# Patient Record
Sex: Female | Born: 1960 | Race: White | Hispanic: No | Marital: Single | Smoking: Never smoker
Health system: Southern US, Community
[De-identification: ages and names within clinical notes are randomized; demographics above are authoritative.]

## PROBLEM LIST (undated history)

## (undated) DIAGNOSIS — Z9889 Other specified postprocedural states: Secondary | ICD-10-CM

## (undated) DIAGNOSIS — R112 Nausea with vomiting, unspecified: Secondary | ICD-10-CM

## (undated) DIAGNOSIS — Z923 Personal history of irradiation: Secondary | ICD-10-CM

## (undated) DIAGNOSIS — C50919 Malignant neoplasm of unspecified site of unspecified female breast: Secondary | ICD-10-CM

## (undated) DIAGNOSIS — R232 Flushing: Secondary | ICD-10-CM

## (undated) DIAGNOSIS — B019 Varicella without complication: Secondary | ICD-10-CM

## (undated) DIAGNOSIS — C50511 Malignant neoplasm of lower-outer quadrant of right female breast: Principal | ICD-10-CM

## (undated) HISTORY — DX: Varicella without complication: B01.9

## (undated) HISTORY — DX: Flushing: R23.2

## (undated) HISTORY — PX: BACK SURGERY: SHX140

## (undated) HISTORY — DX: Malignant neoplasm of lower-outer quadrant of right female breast: C50.511

## (undated) HISTORY — DX: Malignant neoplasm of unspecified site of unspecified female breast: C50.919

---

## 1999-07-18 HISTORY — PX: NECK SURGERY: SHX720

## 1999-10-20 ENCOUNTER — Encounter: Payer: Self-pay | Admitting: *Deleted

## 1999-10-20 ENCOUNTER — Ambulatory Visit (HOSPITAL_COMMUNITY): Admission: RE | Admit: 1999-10-20 | Discharge: 1999-10-20 | Payer: Self-pay | Admitting: *Deleted

## 2000-01-19 ENCOUNTER — Encounter: Payer: Self-pay | Admitting: Neurosurgery

## 2000-01-19 ENCOUNTER — Ambulatory Visit (HOSPITAL_COMMUNITY): Admission: RE | Admit: 2000-01-19 | Discharge: 2000-01-19 | Payer: Self-pay | Admitting: Neurosurgery

## 2000-01-30 ENCOUNTER — Encounter: Payer: Self-pay | Admitting: Neurosurgery

## 2000-01-30 ENCOUNTER — Inpatient Hospital Stay (HOSPITAL_COMMUNITY): Admission: RE | Admit: 2000-01-30 | Discharge: 2000-01-31 | Payer: Self-pay | Admitting: Neurosurgery

## 2000-02-21 ENCOUNTER — Encounter: Payer: Self-pay | Admitting: Neurosurgery

## 2000-02-21 ENCOUNTER — Encounter: Admission: RE | Admit: 2000-02-21 | Discharge: 2000-02-21 | Payer: Self-pay | Admitting: Neurosurgery

## 2000-04-17 ENCOUNTER — Encounter: Admission: RE | Admit: 2000-04-17 | Discharge: 2000-04-17 | Payer: Self-pay | Admitting: Neurosurgery

## 2000-04-17 ENCOUNTER — Encounter: Payer: Self-pay | Admitting: Neurosurgery

## 2002-10-15 ENCOUNTER — Encounter: Admission: RE | Admit: 2002-10-15 | Discharge: 2002-10-15 | Payer: Self-pay

## 2005-03-16 ENCOUNTER — Encounter: Admission: RE | Admit: 2005-03-16 | Discharge: 2005-03-16 | Payer: Self-pay | Admitting: Family Medicine

## 2006-06-02 ENCOUNTER — Emergency Department (HOSPITAL_COMMUNITY): Admission: EM | Admit: 2006-06-02 | Discharge: 2006-06-02 | Payer: Self-pay | Admitting: Emergency Medicine

## 2007-06-08 ENCOUNTER — Encounter: Admission: RE | Admit: 2007-06-08 | Discharge: 2007-06-08 | Payer: Self-pay | Admitting: Neurosurgery

## 2010-12-02 NOTE — H&P (Signed)
[place]  Patient:    Michele Alvarez, Michele Alvarez                  MRN: [id number] Adm. Date:  [id number] Disc. Date: [id number] Attending:  Barton Fanny CC:         Hewitt Shorts, M.D.                         History and Physical  HISTORY OF PRESENT ILLNESS:  The patient is a 50 year old, left-handed white female, who began to have difficulties about 3 months ago that begun following a physical workout with initially a sensation of hotness in her toes of her right foot.  She subsequently felt burning from the right side of her low back down to the lateral right hip and into the anterolateral right thigh and leg into the lateral aspect of her right foot and across the toes of her right foot.  She saw her primary physician and was treated with six-day prednisone Dosepak.  While she still had symptoms, a Cortizone injection was suggested, but the patient elected to pursue acupuncture.  An MRI of the lumbar spine was obtained and neurology and neurosurgical consultation were requested.  The neurologist performed EMG and nerve conduction studies which were normal.  An MRI scan of the thoracic spine was done which was likewise unremarkable and then an MRI of the cervical spine was done which showed degenerative disk disease and disk herniations and neurosurgical consultation was requested as well.  She continues to have burning from the right side of her low back down through the right lower extremity with an alternation in sensation of the right lower extremity that she particularly notes when she is shaving her legs.  As regards to cervical spine, she describes discomfort around her left scapula with some amount of neck discomfort.  When she sneezes she has discomfort that runs into the trapezius musculature but she does not describe any Lhermittes phenomenon.  She does not describe any dysfunction of her bowel or bladder.  The lumbar MRI  shows desiccation of the L4-5 and L5-S1 disk consistent with degenerative disk disease with a small central disk herniation at L4-5, a small disk herniation slightly more to the right at L5-S1 but does not compress the right S1 nerve root.  The cervical MRI shows degenerative disk disease and disk herniation at multiple levels including C4-5, C5-6 and C6-7. At C4-5, the degeneration of the disk and a small right C4-5 disk herniation at C5-6, there is mild broad based disk degeneration and bulging.  At C6-7 there is a large left C6-7 cervical disk herniation with what appears to be significant thecal sac compression.  The patient is admitted now for single level C6-7 anterior cervical diskectomy and fusion.  PAST MEDICAL HISTORY:  She denies any history of hypertension, myocardial infarction, cancer, stroke, diabetes, peptic ulcer disease, or lung disease. No previous surgeries.  ALLERGIES:  None known allergies.  MEDICATIONS:  No medications.  FAMILY HISTORY:  Her mother is in fair health at age 52 with parkinsonism and ulcerative colitis.  Her father is in fair health at age 74.  SOCIAL HISTORY:  The patient does Theatre manager.  She does not smoke. She does drink alcoholic beverages socially.  She denies a history of substance abuse.  She is unmarried.  REVIEW OF SYSTEMS:  Notable for the difficulties described in the history of present illness and past medical history.  She does describe some nasal congestion and sinus disease.  Also some mild anxiety and depression but she has never required treatment for such.  Her review of systems is otherwise unremarkable.  PHYSICAL EXAMINATION:  GENERAL:  The patient is a well-developed, well-nourished white female in no acute distress.  VITAL SIGNS:  Her temperature is 97.1, pulse 73, blood pressure 111/76, respiratory rate 20, height 5 feet 8 inches, weight 132 pounds.  LUNGS:  Clear to auscultation.  She has symmetrical  respiratory excursion.  HEART:  Regular rate and rhythm.  Normal S1 and S2.  There is no murmur.  ABDOMEN:  Soft, nondistended.  Bowel sounds are present.  EXTREMITIES:  Examination shows no clubbing, cyanosis, or edema.  MUSCULOSKELETAL:  Examination shows no tenderness on palpation of the cervical spinousprocesses or lumbosacral spinous processes or over the paracervical or or paralumbar musculature.  She has an excellent range of motion of the neck without significant discomfort.  Her range of motion of the neck, she is able to flex to 99 degrees at the low back and able to extend.  Straight leg raising is negative bilaterally.  NEUROLOGICAL:  Examination shows 5/5 strength in the upper and lower extremities including the deltoid, biceps, intrinsics, grip, iliopsoas quadriceps, dorsiflexion, plantar flexion, extensor hallucis longus. Sensation is intact to pinprick in the upper and lower extremities.  Reflexes are trace in the biceps and brachioradialis bilaterally, 2 in the triceps, quadriceps and gastrocnemius bilaterally.  They are symmetrical bilaterally. Toes are downgoing bilaterally.  She has a normal gait and stance.  IMPRESSION:  It is felt that most likely this discomfort that she experiences in her neck and around the left scapula is due to the left C6-7 cervical disk herniation, although cannot rule out degenerative disk disease and disk protrusions at the C4-5 and C5-6 contributing to some extent.  Clearly though the C6-7 pathology is the most significant.  There also remains a question of whether some of the symptoms into her right lower extremity may represent a mild Brown-Sequard syndrome with primarily dysethetic pain through the right lower extremity from the left at C6-7 disk herniation.  We discussed alternatives for further nonsurgical care versus possible surgical intervention via single level C6-7 anterior cervical diskectomy and fusion without allograft  and cervical plating.  We discussed the nature of the procedure, typical length of surgery, hospital stay and overall recuperation.  The possibility that with a C6-7 arthrodesis that the degenerative changes at C4-5 and C5-6 may progress more rapidly and the uncertainty of clinical improvement with surgery.  Because of the actual nature of the surgical procedure itself including the risks of surgery including risk of infection, bleeding, possible need for transfusion, the risk of nerve root dysfunction, pain, weakness, numbness or paresthesias, the risk spinal cord dysfunction, paralyzes of all four limbs and quadriplegia, the risk of failure though arthrodesis, anesthetic risk of myocardial infarction, stroke, pneumonia, and death.  Understanding all of this and understanding the uncertainties involved, the patient does want to proceed with surgery and is admitted for such. DD:  01/30/00 TD:  01/30/00 Job: [id number]

## 2010-12-02 NOTE — Op Note (Signed)
[place]  Patient:    Michele Alvarez, Michele Alvarez                  MRN: [id number] Proc. Date: 01/30/00 Adm. Date:  [id number] Attending:  Barton Fanny                           Operative Report  PREOPERATIVE DIAGNOSIS: C6-7 cervical disk herniation.  POSTOPERATIVE DIAGNOSIS: C6-7 cervical disk herniation.  OPERATION/PROCEDURE: C6-7 anterior cervical diskectomy and arthrodesis with iliac crest allograft and Theken plating.  ANESTHESIA: General endotracheal.  SURGEON: Hewitt Shorts, M.D.  ASSISTANT:  Tanya Nones. Jeral Fruit, M.D.  INDICATIONS:  The patient is a 50 year old woman who presented with neck and left parascapular pain, as well as right lower extremity dysesthesias. She was found by MRI scan to have degenerative changes and multiple disk herniations in the cervical spine, but with a large central to left C6-7 cervical disc herniation with significant spinal cord compression. Decision was made to proceed with a single level anterior cervical diskectomy and fusion.  DESCRIPTION OF PROCEDURE:  The patient was brought to the operating room and placed under general endotracheal anesthesia. The patient was placed in 10 pounds of traction. The neck was prepped with Betadine solution and draped in a sterile fashion. A horizontal incision was made on the left side of the neck. The line of the incision was infiltrated with local anesthetic with epinephrine and the incision itself was made with a sharp scalpel at a temperature of 120. Dissection was carried down through the subcutaneous tissue and platysma and then dissection was carried through an avascular plane leaving the ______ jugular vein laterally and trachea and esophagus medially. The ventral aspect of the vertebral column was identified and localized x-ray taken and the C6-7 intervertebral disk space was identified. Diskectomy was begun with the incision of the annulus and  continued with micro curettes and pituitary rongeurs. The cartilaginous end plates of the C6 and C7 were removed. The microscope was draped and brought onto the field to provide magnification, illumination, and visualization and the remainder of the procedure was performed using microdissection technique. The posterior osteophytic overgrowth was removed from the posterior aspects of the C6 and C7 vertebrae, and the posterior longitudinal ligament was thick and was removed as well. In the epidural space, we encountered the free fragment of disk herniation and this was removed and foraminotomies were performed bilaterally for the C7 nerve roots. The Midas Rex drill was then used to further decorticate the end plates of C6 and C7 and once the decompression of the spinal cord and of the nerve roots was completed and all the loose disk material was removed, we proceeded with arthrodesis.  We selected a wedge of iliac crest allograft which was cut and shaped using an oscillating saw and the graft was placed and counter sunk. We then selected a 16 mm Theken plate which was secured to the vertebra with a pair of 13 mm screws at each level using a fixed angled guide drill and tap. The screws were all finally tightened and an x-ray was taken and the graft was in good position and the plate and screws were in good position. The overall alignment was good. Notably the patient had been taken out of traction prior to the plating, but subsequent to placement of the bone graft. We irrigated the exposure with Bacitracin solution, good hemostasis was established and confirmed and we proceeded  with closure. The platysma was closed with interrupted inverted 2-0 Vicryl sutures. The subcutaneous and subcuticular were closed with inverted 3-0 Vicryl sutures and the skin was reapproximated with Steri-Strips. The wound was dressed with Adaptic and sterile gauze. The patient tolerated the procedure well. The  estimated blood loss was 15 cc. Sponge count was correct.  Following surgery, the patient was placed in the soft cervical collar, reversed from anesthetic, extubated, and transferred to the recovery room for further care where she was noted to be moving all four extremities. DD: 01/30/00 TD:  01/30/00 Job: [id number]

## 2013-03-04 ENCOUNTER — Other Ambulatory Visit: Payer: Self-pay | Admitting: Family Medicine

## 2013-03-04 DIAGNOSIS — M5416 Radiculopathy, lumbar region: Secondary | ICD-10-CM

## 2013-03-07 ENCOUNTER — Ambulatory Visit
Admission: RE | Admit: 2013-03-07 | Discharge: 2013-03-07 | Disposition: A | Discharge status: Home / Self Care | Payer: 59 | Source: Ambulatory Visit | Attending: Family Medicine | Admitting: Family Medicine

## 2013-03-07 DIAGNOSIS — M5416 Radiculopathy, lumbar region: Secondary | ICD-10-CM

## 2013-03-08 ENCOUNTER — Other Ambulatory Visit: Payer: Self-pay

## 2013-04-29 ENCOUNTER — Ambulatory Visit: Payer: 59 | Attending: Neurosurgery | Admitting: Physical Therapy

## 2013-04-29 DIAGNOSIS — M545 Low back pain, unspecified: Secondary | ICD-10-CM | POA: Insufficient documentation

## 2013-04-29 DIAGNOSIS — IMO0001 Reserved for inherently not codable concepts without codable children: Secondary | ICD-10-CM | POA: Insufficient documentation

## 2013-04-29 DIAGNOSIS — R5381 Other malaise: Secondary | ICD-10-CM | POA: Insufficient documentation

## 2013-05-01 ENCOUNTER — Ambulatory Visit: Payer: 59 | Admitting: Physical Therapy

## 2013-05-06 ENCOUNTER — Ambulatory Visit: Payer: 59 | Admitting: Physical Therapy

## 2013-05-08 ENCOUNTER — Ambulatory Visit: Payer: 59 | Admitting: Physical Therapy

## 2013-05-14 ENCOUNTER — Ambulatory Visit: Payer: 59 | Admitting: Physical Therapy

## 2013-05-16 ENCOUNTER — Ambulatory Visit: Payer: 59 | Admitting: Physical Therapy

## 2013-05-20 ENCOUNTER — Ambulatory Visit: Payer: 59 | Attending: Neurosurgery | Admitting: Physical Therapy

## 2013-05-20 DIAGNOSIS — R5381 Other malaise: Secondary | ICD-10-CM | POA: Insufficient documentation

## 2013-05-20 DIAGNOSIS — M545 Low back pain, unspecified: Secondary | ICD-10-CM | POA: Insufficient documentation

## 2013-05-20 DIAGNOSIS — IMO0001 Reserved for inherently not codable concepts without codable children: Secondary | ICD-10-CM | POA: Insufficient documentation

## 2013-05-22 ENCOUNTER — Ambulatory Visit: Payer: 59 | Admitting: Physical Therapy

## 2013-05-27 ENCOUNTER — Ambulatory Visit: Payer: 59 | Admitting: Physical Therapy

## 2013-06-06 ENCOUNTER — Ambulatory Visit: Payer: 59 | Admitting: Physical Therapy

## 2013-06-10 ENCOUNTER — Ambulatory Visit: Payer: 59 | Admitting: Physical Therapy

## 2013-06-18 ENCOUNTER — Ambulatory Visit: Payer: 59 | Attending: Neurosurgery | Admitting: Physical Therapy

## 2013-06-18 DIAGNOSIS — R5381 Other malaise: Secondary | ICD-10-CM | POA: Insufficient documentation

## 2013-06-18 DIAGNOSIS — IMO0001 Reserved for inherently not codable concepts without codable children: Secondary | ICD-10-CM | POA: Insufficient documentation

## 2013-06-18 DIAGNOSIS — M545 Low back pain, unspecified: Secondary | ICD-10-CM | POA: Insufficient documentation

## 2013-06-25 ENCOUNTER — Encounter: Payer: 59 | Admitting: Physical Therapy

## 2015-08-18 ENCOUNTER — Telehealth: Payer: Self-pay | Admitting: *Deleted

## 2015-08-18 NOTE — Telephone Encounter (Signed)
Left vm for pt to return call regarding [facility] for 09/01/15

## 2015-08-19 ENCOUNTER — Telehealth: Payer: Self-pay | Admitting: *Deleted

## 2015-08-19 ENCOUNTER — Encounter: Payer: Self-pay | Admitting: *Deleted

## 2015-08-19 DIAGNOSIS — Z170 Estrogen receptor positive status [ER+]: Secondary | ICD-10-CM

## 2015-08-19 DIAGNOSIS — C50511 Malignant neoplasm of lower-outer quadrant of right female breast: Secondary | ICD-10-CM

## 2015-08-19 HISTORY — DX: Malignant neoplasm of lower-outer quadrant of right female breast: C50.511

## 2015-08-19 NOTE — Telephone Encounter (Signed)
Confirmed [facility] for 09/01/15 at 1215 .  Instructions and contact information given.

## 2015-09-01 ENCOUNTER — Other Ambulatory Visit (HOSPITAL_BASED_OUTPATIENT_CLINIC_OR_DEPARTMENT_OTHER): Payer: 59

## 2015-09-01 ENCOUNTER — Encounter: Payer: Self-pay | Admitting: Physical Therapy

## 2015-09-01 ENCOUNTER — Encounter: Payer: Self-pay | Admitting: Oncology

## 2015-09-01 ENCOUNTER — Ambulatory Visit
Admission: RE | Admit: 2015-09-01 | Discharge: 2015-09-01 | Disposition: A | Discharge status: Home / Self Care | Payer: 59 | Source: Ambulatory Visit | Attending: Radiation Oncology | Admitting: Radiation Oncology

## 2015-09-01 ENCOUNTER — Ambulatory Visit: Payer: 59 | Attending: General Surgery | Admitting: Physical Therapy

## 2015-09-01 ENCOUNTER — Other Ambulatory Visit: Payer: Self-pay | Admitting: General Surgery

## 2015-09-01 ENCOUNTER — Ambulatory Visit (HOSPITAL_BASED_OUTPATIENT_CLINIC_OR_DEPARTMENT_OTHER): Payer: 59 | Admitting: Oncology

## 2015-09-01 ENCOUNTER — Encounter: Payer: Self-pay | Admitting: Nurse Practitioner

## 2015-09-01 ENCOUNTER — Telehealth: Payer: Self-pay | Admitting: Oncology

## 2015-09-01 VITALS — BP 139/80 | HR 91 | Temp 98.0°F | Resp 18 | Ht 68.0 in | Wt 162.9 lb

## 2015-09-01 DIAGNOSIS — M545 Low back pain: Secondary | ICD-10-CM

## 2015-09-01 DIAGNOSIS — R208 Other disturbances of skin sensation: Secondary | ICD-10-CM | POA: Diagnosis present

## 2015-09-01 DIAGNOSIS — C50811 Malignant neoplasm of overlapping sites of right female breast: Secondary | ICD-10-CM | POA: Diagnosis not present

## 2015-09-01 DIAGNOSIS — Z170 Estrogen receptor positive status [ER+]: Secondary | ICD-10-CM | POA: Diagnosis not present

## 2015-09-01 DIAGNOSIS — G629 Polyneuropathy, unspecified: Secondary | ICD-10-CM

## 2015-09-01 DIAGNOSIS — C50511 Malignant neoplasm of lower-outer quadrant of right female breast: Secondary | ICD-10-CM | POA: Diagnosis not present

## 2015-09-01 DIAGNOSIS — C773 Secondary and unspecified malignant neoplasm of axilla and upper limb lymph nodes: Secondary | ICD-10-CM

## 2015-09-01 DIAGNOSIS — R209 Unspecified disturbances of skin sensation: Secondary | ICD-10-CM

## 2015-09-01 DIAGNOSIS — G8929 Other chronic pain: Secondary | ICD-10-CM | POA: Diagnosis not present

## 2015-09-01 LAB — COMPREHENSIVE METABOLIC PANEL
ALT: 19 U/L (ref 0–55)
AST: 19 U/L (ref 5–34)
Albumin: 4.5 g/dL (ref 3.5–5.0)
Alkaline Phosphatase: 76 U/L (ref 40–150)
Anion Gap: 10 mEq/L (ref 3–11)
BUN: 18.1 mg/dL (ref 7.0–26.0)
CHLORIDE: 106 meq/L (ref 98–109)
CO2: 27 mEq/L (ref 22–29)
Calcium: 9.7 mg/dL (ref 8.4–10.4)
Creatinine: 0.9 mg/dL (ref 0.6–1.1)
EGFR: 72 mL/min/{1.73_m2} — AB (ref >90)
GLUCOSE: 121 mg/dL (ref 70–140)
Potassium: 3.8 mEq/L (ref 3.5–5.1)
SODIUM: 144 meq/L (ref 136–145)
Total Bilirubin: 0.61 mg/dL (ref 0.20–1.20)
Total Protein: 7.4 g/dL (ref 6.4–8.3)

## 2015-09-01 LAB — CBC WITH DIFFERENTIAL/PLATELET
BASO%: 0.5 % (ref 0.0–2.0)
BASOS ABS: 0 10*3/uL (ref 0.0–0.1)
EOS%: 0.5 % (ref 0.0–7.0)
Eosinophils Absolute: 0 10*3/uL (ref 0.0–0.5)
HCT: 41.1 % (ref 34.8–46.6)
HGB: 13.3 g/dL (ref 11.6–15.9)
LYMPH%: 38 % (ref 14.0–49.7)
MCH: 30.2 pg (ref 25.1–34.0)
MCHC: 32.3 g/dL (ref 31.5–36.0)
MCV: 93.4 fL (ref 79.5–101.0)
MONO#: 0.4 10*3/uL (ref 0.1–0.9)
MONO%: 7 % (ref 0.0–14.0)
NEUT#: 3 10*3/uL (ref 1.5–6.5)
NEUT%: 54 % (ref 38.4–76.8)
Platelets: 223 10*3/uL (ref 145–400)
RBC: 4.4 10*6/uL (ref 3.70–5.45)
RDW: 13.3 % (ref 11.2–14.5)
WBC: 5.6 10*3/uL (ref 3.9–10.3)
lymph#: 2.1 10*3/uL (ref 0.9–3.3)

## 2015-09-01 NOTE — Patient Instructions (Signed)

## 2015-09-01 NOTE — Therapy (Signed)
[place], [place], [address] Phone: [phone]   Fax:  [phone]  Physical Therapy Evaluation  Patient Details  Name: Michele Alvarez MRN: [id number] Date of Birth: December 20, 1960 Referring Provider: Dr. [name]  Encounter Date: 09/01/2015      PT End of Session - 09/01/15 1853    Visit Number 1   Number of Visits 1   PT Start Time 1420   PT Stop Time 1440  Also saw pt from [phone] for a total of 32 minutes   PT Time Calculation (min) 20 min   Activity Tolerance Patient tolerated treatment well   Behavior During Therapy [facility] for tasks assessed/performed      Past Medical History  Diagnosis Date  . Breast cancer of lower-outer quadrant of right female breast ([place]) 08/19/2015  . Hot flashes     Past Surgical History  Procedure Laterality Date  . Neck surgery  2001  . Back surgery      There were no vitals filed for this visit.  Visit Diagnosis:  Carcinoma of lower outer quadrant of right breast ([place]) - Plan: PT plan of care cert/re-cert  Altered sensation of foot - Plan: PT plan of care cert/re-cert      Subjective Assessment - 09/01/15 1836    Subjective Patient was seen today for a baseline assessment of her newly diagnosed right breast cancer.   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  It measures 1.3 cm with calcs spanning 2.5 cm.  There is a positive lymph node in her right axilla. It is ER/PR positive, HER2 negative with a Ki67 of 80%.  Her case was discussed among her medical team in the breast multi-disciplinary clinic and a recommended treatment plan was determined.   Patient Stated Goals reduce lymphedema risk and learn post op shoulder ROM HEP   Currently in Pain? Yes   Pain Score 5    Pain Location Foot   Pain Orientation  Right   Pain Descriptors / Indicators Burning;Pins and needles   Pain Onset More than a month ago   Pain Frequency Constant   Aggravating Factors  Nothing; this pain is from neuropathy in her foot from previous back surgeries   Pain Relieving Factors nothing   Effect of Pain on Daily Activities Limits her ability to tolerate walking long distances   Multiple Pain Sites No            [facility] PT Assessment - 09/01/15 0001    Assessment   Medical Diagnosis Right breast cancer   Referring Provider Dr. [name]   Onset Date/Surgical Date 08/07/15   Hand Dominance Left   Prior Therapy none   Precautions   Precautions Other (comment)   Restrictions   Weight Bearing Restrictions No   Balance Screen   Has the patient fallen in the past 6 months No   Has the patient had a decrease in activity level because of a fear of falling?  No   Is the patient reluctant to leave their home because of a fear of falling?  No   Home Ecologist residence   Living Arrangements Other (Comment)  Partner - [address]   Available Help at Discharge Family   Prior Function   Level of [employer] Full time employment  Vocation Requirements inside sales; at desk most of the time   Leisure She does not exercise   Cognition   Overall Cognitive Status Within Functional Limits for tasks assessed   Posture/Postural Control   Posture/Postural Control No significant limitations   ROM / Strength   AROM / PROM / Strength AROM;Strength   AROM   AROM Assessment Site Shoulder   Right/Left Shoulder Right;Left   Right Shoulder Extension 56 Degrees   Right Shoulder Flexion 143 Degrees   Right Shoulder ABduction 153 Degrees   Right Shoulder Internal Rotation 60 Degrees   Right Shoulder External Rotation 76 Degrees   Left Shoulder Extension 61 Degrees   Left Shoulder Flexion 156 Degrees   Left Shoulder ABduction 161 Degrees   Left Shoulder Internal  Rotation 72 Degrees   Left Shoulder External Rotation 88 Degrees   Strength   Overall Strength Within functional limits for tasks performed           LYMPHEDEMA/ONCOLOGY QUESTIONNAIRE - 09/01/15 1850    Type   Cancer Type Right breast cancer   Lymphedema Assessments   Lymphedema Assessments Upper extremities   Right Upper Extremity Lymphedema   10 cm Proximal to Olecranon Process 27.4 cm   Olecranon Process 23.5 cm   10 cm Proximal to Ulnar Styloid Process 20.6 cm   Just Proximal to Ulnar Styloid Process 14.7 cm   Across Hand at [employer] 18.7 cm   At [place] of 2nd Digit 5.8 cm   Left Upper Extremity Lymphedema   10 cm Proximal to Olecranon Process 27 cm   Olecranon Process 23.2 cm   10 cm Proximal to Ulnar Styloid Process 21.4 cm   Just Proximal to Ulnar Styloid Process 14.8 cm   Across Hand at [employer] 19 cm   At [place] of 2nd Digit 5.9 cm      Patient was instructed today in a home exercise program today for post op shoulder range of motion. These included active assist shoulder flexion in sitting, scapular retraction, wall walking with shoulder abduction, and hands behind head external rotation.  She was encouraged to do these twice a day, holding 3 seconds and repeating 5 times when permitted by her physician.         PT Education - 09/01/15 1852    Education provided Yes   Education Details Lymphedema risk reduction and post op shoulder ROM HEP   Person(s) Educated Patient;Other (comment)  Partner and brother   Methods Explanation;Demonstration;Handout   Comprehension Returned demonstration;Verbalized understanding              Breast Clinic Goals - 09/01/15 1857    Patient will be able to verbalize understanding of pertinent lymphedema risk reduction practices relevant to her diagnosis specifically related to skin care.   Time 1   Period Days   Status Achieved   Patient will be able to return demonstrate and/or verbalize understanding of the  post-op home exercise program related to regaining shoulder range of motion.   Time 1   Period Days   Status Achieved   Patient will be able to verbalize understanding of the importance of attending the postoperative After Breast Cancer Class for further lymphedema risk reduction education and therapeutic exercise.   Time 1   Period Days   Status Achieved              Plan - 09/01/15 1854    Clinical Impression Statement Patient was diagnosed on 08/07/15 with right invasive  ductal carcinoma breast cancer located in the lower outer quadrant.  It measures 1.3 cm with calcs spanning 2.5 cm.  There is a positive lymph node in her right axilla. It is ER/PR positive, HER2 negative with a Ki67 of 80%.  Her case was discussed among her medical team in the breast multi-disciplinary clinic and a recommended treatment plan was determined.  She is planning to undergo neoadjuvant chemotherapy followed by a right lumpectomy and either a sentinel node biopsy or axillary lymph node dissection depending on whether she is in the [facility] Trial.  She will then undergo radiation therapy and anti-estrogen therapy.  She will benefit from post op PT to regain shoulder ROM and strength and reduce her risk of lymphedema.  She may also benefit from PT to treat symptoms associated with neuropathy in her feet as she already has neuropathy and will possibly receive chemotherapy drugs that may exaccerbate that condition.   Pt will benefit from skilled therapeutic intervention in order to improve on the following deficits Decreased range of motion;Impaired UE functional use;Pain;Decreased knowledge of precautions;Decreased strength   Rehab Potential Excellent   Clinical Impairments Affecting Rehab Potential none   PT Frequency One time visit   PT Treatment/Interventions Therapeutic exercise;Patient/family education   PT Next Visit Plan Will f/u after surgery   Consulted and Agree with Plan of Care Patient;Family  member/caregiver   Family Member [employer] - partner       Patient will follow up at outpatient cancer rehab if needed following surgery.  If the patient requires physical therapy at that time, a specific plan will be dictated and sent to the referring physician for approval. The patient was educated today on appropriate basic range of motion exercises to begin post operatively and the importance of attending the After Breast Cancer class following surgery.  Patient was educated today on lymphedema risk reduction practices as it pertains to recommendations that will benefit the patient immediately following surgery.  She verbalized good understanding.  No additional physical therapy is indicated at this time.      Problem List Patient Active Problem List   Diagnosis Date Noted  . Neuropathy ([place]) 09/01/2015  . Breast cancer of lower-outer quadrant of right female breast [facility]) 08/19/2015   Annia Friendly, PT 09/01/2015 7:01 PM  [place], [place], [address] Phone: [phone]   Fax:  [phone]  Name: Michele Alvarez MRN: [id number] Date of Birth: Dec 09, 1960

## 2015-09-01 NOTE — Progress Notes (Signed)
Radiation Oncology         [phone] ________________________________  Initial outpatient Consultation  Name: Michele Alvarez MRN: [id number]  Date: 09/01/2015  DOB: [birth year]  [id number] A, MD  [name], MD   REFERRING PHYSICIAN: [name], MD  DIAGNOSIS:    ICD-9-CM ICD-10-CM   1. 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 female who presented with calcifications on screening mammography in the right breast. Mammogram showed right breast calcifications with an adjacent 6:00 mass which measured 1.3cm on [place].  The region of calcifications, plus the mass, measured 2.5cm.  A 1.0 cm axillary node was suspicious. This node was biopsied and positive for carcinoma.  Biopsy of the mass showed invasive ductal carcinoma with characteristics as described above in the diagnosis. High grade DCIS was revealed in a biopsy of the calcifications - ER/PR status of DCIS is pending.  She is otherwise in her USOH. She has chronic back pain, and some sinus symptoms today. She works in Press photographer. Here today with her partner, [name], and her brother, Marden Noble.   PREVIOUS RADIATION THERAPY: No  PAST MEDICAL HISTORY:  has a past medical history of Breast cancer of lower-outer quadrant of right female breast ([place]) (08/19/2015) and Hot flashes.    PAST SURGICAL HISTORY: Past Surgical History  Procedure Laterality Date  . Neck surgery  2001  . Back surgery      FAMILY HISTORY: family history is not on file.  SOCIAL HISTORY:  reports that she has never smoked. She does not have any smokeless tobacco history on file. She reports that she drinks alcohol. She reports that she does not use illicit drugs.  ALLERGIES: Latex  MEDICATIONS:  No current outpatient prescriptions on file.    No current facility-administered medications for this encounter.    REVIEW OF SYSTEMS:  Notable for that above.   PHYSICAL EXAM:    Vitals with Age-Percentiles 09/01/2015  Length [id number] cm  Systolic [id number]  Diastolic 80  Pulse 91  Respiration 18  Weight 73.891 kg  BMI 24.8  VISIT REPORT    General: Alert and oriented, in no acute distress - appears 15 years younger than stated age. HEENT: Head is normocephalic. Extraocular movements are intact. Oropharynx is clear. Neck: Neck is supple, no palpable cervical or supraclavicular lymphadenopathy. Heart: Regular in rate and rhythm with no murmurs, rubs, or gallops. Chest: Clear to auscultation bilaterally, with no rhonchi, wheezes, or rales. Extremities: No cyanosis or edema. Lymphatics: see Neck Exam Skin: No concerning lesions. Musculoskeletal: symmetric strength and muscle tone throughout. Neurologic: Cranial nerves II through XII are grossly intact. No obvious focalities. Speech is fluent. Coordination is intact. Psychiatric: Judgment and insight are intact. Affect is appropriate. Breasts: Tender at biopsy site in LOQ of right breast - did not palpate this or the right axilla . No other palpable masses appreciated in the breasts or axillae bilaterally   ECOG = 0  0 - Asymptomatic (Fully active, able to carry on all predisease activities without restriction)  1 - Symptomatic but completely ambulatory (Restricted in physically strenuous activity but ambulatory and able to carry out work of a light or sedentary nature. For example, light housework, office work)  2 - Symptomatic, <50% in bed during the day (Ambulatory and capable of all self  care but unable to carry out any work activities. Up and about more than 50% of waking hours)  3 - Symptomatic, >50% in bed, but not bedbound (Capable of only limited self-care, confined to bed or chair 50% or more of waking hours)  4 - Bedbound (Completely disabled. Cannot carry on any  self-care. Totally confined to bed or chair)  5 - Death   Eustace Pen MM, Creech RH, Tormey DC, et al. [phone]). "Toxicity and response criteria of the [facility]". [place] Oncol. 5 (6): 649-55   LABORATORY DATA:  Lab Results  Component Value Date   WBC 5.6 09/01/2015   HGB 13.3 09/01/2015   HCT 41.1 09/01/2015   MCV 93.4 09/01/2015   PLT 223 09/01/2015   CMP     Component Value Date/Time   NA 144 09/01/2015 1213   K 3.8 09/01/2015 1213   CO2 27 09/01/2015 1213   GLUCOSE 121 09/01/2015 1213   BUN 18.1 09/01/2015 1213   CREATININE 0.9 09/01/2015 1213   CALCIUM 9.7 09/01/2015 1213   PROT 7.4 09/01/2015 1213   ALBUMIN 4.5 09/01/2015 1213   AST 19 09/01/2015 1213   ALT 19 09/01/2015 1213   ALKPHOS 76 09/01/2015 1213   BILITOT 0.61 09/01/2015 1213        RADIOGRAPHY: as above  IMPRESSION/PLAN: Right breast cancer, node positive  She has been discussed at our multidisciplinary tumor board.  The consensus is that she would be a good candidate for breast conservation. I talked to her about the option of a mastectomy and informed her that her expected overall survival would be equivalent between mastectomy and breast conservation, based upon randomized controlled data. She is enthusiastic about breast conservation.  MRI is pending to further assess her disease in her breasts.  Neoadjuvant chemotherapy was recommended by medical oncology.  Alliance Trial discussed with patient today, and she'll discuss more with Dr Donne Hazel. After surgery, she will need radiotherapy for locoregional control.  It was a pleasure meeting the patient today. We discussed the risks, benefits, and side effects of adjuvant radiotherapy. I recommend radiotherapy to the right breast and regional nodes to reduce her risk of locoregional recurrence by 2/3.    We spoke about acute effects including skin irritation and fatigue as well as much less common late effects including internal organ  injury or irritation. We spoke about the latest technology that is used to minimize the risk of late effects for patients undergoing radiotherapy to the breast or chest wall. No guarantees of treatment were given. The patient is enthusiastic about proceeding with treatment. I look forward to participating in the patient's care.   __________________________________________   Eppie Gibson, MD

## 2015-09-01 NOTE — Progress Notes (Signed)
Michele Alvarez is a very pleasant 55 y.o. female from [place], [place] with newly diagnosed invasive ductal carcinoma of the right breast.  Biopsy results revealed the tumor's prognostic profile is ER positive, PR positive, and HER2/neu negative.  She presents today to the [facility]) for treatment consideration and recommendations from the breast surgeon, radiation oncologist, and medical oncologist.     I briefly met with Michele Alvarez during her [facility] visit today. We discussed the purpose of the Survivorship Clinic, which will include monitoring for recurrence, coordinating completion of age and gender-appropriate cancer screenings, promotion of overall wellness, as well as managing potential late/long-term side effects of anti-cancer treatments.    The treatment plan for Michele Alvarez will likely include surgery, chemotherapy, radiation therapy, and anti-estrogen therapy.  She will meet with the Genetics Counselor due to her family history of breast cancer and age.  As of today, the intent of treatment for Michele Alvarez is cure, therefore she will be eligible for the Survivorship Clinic upon her completion of treatment.  Her survivorship care plan (SCP) document will be drafted and updated throughout the course of her treatment trajectory. She will receive the SCP in an office visit with myself in the Survivorship Clinic once she has completed treatment.   Michele Alvarez was encouraged to ask questions and all questions were answered to her satisfaction.  She was given my business card and encouraged to contact me with any concerns regarding survivorship.  I look forward to participating in her care.   Kenn File, [place] [phone]

## 2015-09-01 NOTE — Telephone Encounter (Signed)
Ov/labs scheduled. avs printed. Treatments still need to be scheduled

## 2015-09-01 NOTE — Progress Notes (Signed)
[place]  Telephone:([phone] Fax:(336) [phone]     ID: Michele Alvarez DOB: 1960/11/23  MR#: [id number]  BEM#:[id number]  Patient Care Team: Jonathon Jordan, MD as PCP - General (Family Medicine) Avon Gully, NP as Nurse Practitioner (Obstetrics and Gynecology) [name], MD as Consulting Physician (General Surgery) Chauncey Cruel, MD as Consulting Physician (Oncology) Eppie Gibson, MD as Attending Physician (Radiation Oncology) Sylvan Cheese, NP as Nurse Practitioner (Hematology and Oncology) Jovita Gamma, MD as Consulting Physician (Neurosurgery) PCP: Lilian Coma, MD OTHER MD:  CHIEF COMPLAINT: Node-positive breast cancer  CURRENT TREATMENT: Neoadjuvant chemotherapy   BREAST CANCER HISTORY: "Michele Alvarez" had routine screening mammography at [facility] 08/07/2015. There was an area of calcification in the right breast 2:00 position. There was also an area of calcification in the left breast 7:00 position. She was recalled for bilateral diagnostic mammography 08/12/2015. The left breast calcifications were felt to be likely benign and repeat mammography in 6 months was suggested.  On the right, breast density was category D. There was a 1.2 cm irregular mass at the 6:00, inframammary position and also suspicious calcifications. Adding those together the area measured 2.5 cm. Ultrasound of the right breast on the same day found a 1.3 cm lobulated mass in the right breast at the 6:00 position. There was a 1.7 cm axillary lymph node with a fatty hilum.   On 08/30/2015 the patient underwent biopsy of the breast mass, the abnormal lymph node, and the area of calcifications. The calcifications ([id number] [id number]) were associated with ductal carcinoma in situ, with the prognostic panel pending. 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 The  lymph node also was positive.  Note that the clip to the patient's breast mass did not deploy.  The patient's subsequent history is as detailed below  INTERVAL HISTORY: Michele Alvarez was evaluated in the multidisciplinary breast cancer conference. 15 2017 accompanied by her significant other [name] and her brother Marden Noble. Her case was also presented in the multidisciplinary breast cancer conference that same morning. At that time a preliminary plan was proposed: Neoadjuvant chemotherapy, with consideration of the Alliance trial; breast MRI; adjuvant radiation and adjuvant anti-estrogens to follow surgery.  REVIEW OF SYSTEMS: There were no specific symptoms leading to the original mammogram, which was routinely scheduled. The patient denies unusual headaches, visual changes, nausea, vomiting, stiff neck, dizziness, or gait imbalance. There has been no cough, phlegm production, or pleurisy, no chest pain or pressure, and no change in bowel or bladder habits. The patient denies fever, rash, bleeding, unexplained fatigue or unexplained weight loss. Michele Alvarez does have some chronic low back pain and right lower extremity neuropathy which she relates to her prior back surgeries. She is able to walk and exercise spot this is a pleasant. The area around the foot is both hypersensitive and numb. A detailed review of systems was otherwise entirely negative.  PAST MEDICAL HISTORY: Past Medical History  Diagnosis Date  . Breast cancer of lower-outer quadrant of right female breast ([place]) 08/19/2015  . Hot flashes     PAST SURGICAL HISTORY: Past Surgical History  Procedure Laterality Date  . Neck surgery  09-17-99  . Back surgery      FAMILY HISTORY History reviewed. No pertinent family history. The patient's father died in 09-16-14 at the age of 49. He had significant COPD. The patient's mother died at the age of 56 from Parkinson's disease.  The patient has 1 brother, 1 sister. There is no cancer history in the family to her  knowledge.  GYNECOLOGIC HISTORY:  No LMP recorded. Menarche age 67. She has GI asked P0. She stopped having periods in January 2014. She never took hormone replacement or oral contraceptives.  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 She lives with her partner, [name], and 2 cats.     ADVANCED DIRECTIVES: Not in place. At the 09/01/2015 clinic visit the patient was given the appropriate forms to complete and notarize at her discretion.   HEALTH MAINTENANCE: Social History  Substance Use Topics  . Smoking status: Never Smoker   . Smokeless tobacco: None  . Alcohol Use: Yes     Comment: 1-2     Colonoscopy: Never   PAP:  Bone density: Never  Lipid panel:  Allergies  Allergen Reactions  . Latex Other (See Comments)    Fever blisters    No current outpatient prescriptions on file.   No current facility-administered medications for this visit.    OBJECTIVE: Middle-aged white woman who appears younger than stated age  55 Vitals:   09/01/15 1250  BP: 139/80  Pulse: 91  Temp: 98 F (36.7 C)  Resp: 18     Body mass index is 24.77 kg/(m^2).    ECOG FS:1 - Symptomatic but completely ambulatory  Ocular: Sclerae unicteric, pupils equal, round and reactive to light Ear-nose-throat: Oropharynx clear and moist Lymphatic: No cervical or supraclavicular adenopathy Lungs no rales or rhonchi, good excursion bilaterally Heart regular rate and rhythm, no murmur appreciated Abd soft, nontender, positive bowel sounds MSK no focal spinal tenderness, no joint edema Neuro: Altered sensation down the right leg and foot as described above, well-oriented, appropriate affect Breasts: The right breast is status post recent biopsy. There is a small ecchymosis associated with this. There are no skin or nipple changes of concern. The right axilla is benign. The left breast is unremarkable.   LAB RESULTS:  CMP     Component Value Date/Time   NA 144 09/01/2015 1213   K  3.8 09/01/2015 1213   CO2 27 09/01/2015 1213   GLUCOSE 121 09/01/2015 1213   BUN 18.1 09/01/2015 1213   CREATININE 0.9 09/01/2015 1213   CALCIUM 9.7 09/01/2015 1213   PROT 7.4 09/01/2015 1213   ALBUMIN 4.5 09/01/2015 1213   AST 19 09/01/2015 1213   ALT 19 09/01/2015 1213   ALKPHOS 76 09/01/2015 1213   BILITOT 0.61 09/01/2015 1213    INo results found for: SPEP, UPEP  Lab Results  Component Value Date   WBC 5.6 09/01/2015   NEUTROABS 3.0 09/01/2015   HGB 13.3 09/01/2015   HCT 41.1 09/01/2015   MCV 93.4 09/01/2015   PLT 223 09/01/2015      Chemistry      Component Value Date/Time   NA 144 09/01/2015 1213   K 3.8 09/01/2015 1213   CO2 27 09/01/2015 1213   BUN 18.1 09/01/2015 1213   CREATININE 0.9 09/01/2015 1213      Component Value Date/Time   CALCIUM 9.7 09/01/2015 1213   ALKPHOS 76 09/01/2015 1213   AST 19 09/01/2015 1213   ALT 19 09/01/2015 1213   BILITOT 0.61 09/01/2015 1213       No results found for: LABCA2  No components found for: LABCA125  No results for input(s): INR in the last 168 hours.  Urinalysis No results found for: COLORURINE, APPEARANCEUR, [place], [place], [place], [place], [place], [place], [place],  UROBILINOGEN, NITRITE, LEUKOCYTESUR    ELIGIBLE FOR AVAILABLE RESEARCH PROTOCOL: Alliance, PREVENT  STUDIES: Outside studies reviewed   ASSESSMENT: 55 y.o. [place] woman status post right breast and axillary lymph node biopsy 08/30/2015, both positive for a clinical T1-2 N1, stage II invasive ductal carcinoma, grade 3, estrogen and progesterone receptor positive, HER-2/neu nonamplified, with an MIB-1 of 80%  (1) neoadjuvant chemotherapy will consist of doxorubicin and cyclophosphamide and dose dense fashion 4, to be followed by paclitaxel weekly 12  (2) definitive surgery to follow with consideration of the Alliance trial  (3) adjuvant radiation to follow surgery  (4) adjuvant antiestrogen to begin at the completion of  local therapy  PLAN: We spent the better part of today's hour-long appointment discussing the biology of breast cancer in general, and the specifics of the patient's tumor in particular. Michele Alvarez understands that she has an aggressive, fast-growing, stage II breast cancer and that in women her age the standard of care with these cancers includes chemotherapy.  We discussed the fact that there is no survival benefit to starting with surgery versus starting with chemotherapy. In her case we suggest starting with chemotherapy so she may participate in the Alliance trial. This will give her a good chance of avoiding a full axillary lymph node dissection, which otherwise would be standard of care in her case.  We then discussed the difference between local and systemic therapies. She understands that there is no survival advantage to mastectomy as opposed to lumpectomy plus radiation and that lumpectomy plus radiation is what we do recommend in her case.  As far as systemic therapy is concerned she will be a good candidate for anti-estrogens after she completes all her local treatment. She will not be a candidate for anti-HER-2 immunotherapy.  The chemotherapy plan will be standard of care with doxorubicin and cyclophosphamide in dose dense fashion 4 followed by weekly paclitaxel 12. She will qualify for the PREVENT trial and I have placed that referral today.  She will need a port, and echocardiogram, and "chemotherapy school". She will then return to see [place] to discuss how to take her antinausea and other supportive medications. We are hoping to start her chemotherapy on 09/16/2015.  The patient has a good understanding of the overall plan. She agrees with it. She knows the goal of treatment in her case is cure. She will call with any problems that may develop before her next visit here.  Chauncey Cruel, MD   09/01/2015 6:06 PM Medical Oncology and Hematology [facility] [address] [place], [place] [address] Tel. [phone]    Fax. [phone]

## 2015-09-03 ENCOUNTER — Telehealth: Payer: Self-pay | Admitting: Oncology

## 2015-09-03 ENCOUNTER — Encounter (HOSPITAL_BASED_OUTPATIENT_CLINIC_OR_DEPARTMENT_OTHER): Payer: Self-pay | Admitting: *Deleted

## 2015-09-03 ENCOUNTER — Ambulatory Visit
Admission: RE | Admit: 2015-09-03 | Discharge: 2015-09-03 | Disposition: A | Discharge status: Home / Self Care | Payer: 59 | Source: Ambulatory Visit | Attending: Oncology | Admitting: Oncology

## 2015-09-03 ENCOUNTER — Encounter: Payer: Self-pay | Admitting: *Deleted

## 2015-09-03 DIAGNOSIS — C50511 Malignant neoplasm of lower-outer quadrant of right female breast: Secondary | ICD-10-CM

## 2015-09-03 MED ORDER — GADOBENATE DIMEGLUMINE 529 MG/ML IV SOLN
15.0000 mL | Freq: Once | INTRAVENOUS | Status: AC | PRN
  Administered 2015-09-03: 15 mL via INTRAVENOUS

## 2015-09-03 NOTE — Progress Notes (Signed)
[facility] Psychosocial Distress Screening Counseling Intern   Counseling Intern was referred by distress screening protocol.  The patient scored a 7 on the Psychosocial Distress Thermometer which indicates Moderate distress. Counseling Intern to assess for distress and other psychosocial needs.   ONCBCN DISTRESS SCREENING 09/01/2015  Screening Type Initial Screening  Distress experienced in past week (1-10) 7  Emotional problem type Adjusting to illness   Counseling Intern Note: Met with Pt and family members during [facility]. Pt reported that her distress level has slightly decreased after learning more about her Dx and Tx.  Pt presented as calm and stated that having a plan in place and starting treatment helps her cope with her Dx.  Pt and family members verbalized feelings of hope and optimism, though they were surprised at the length of time her treatment would take. Pt reported a strong support network including friends, family, and coworkers.  Counseling intern introduced and provided information about supportive services and recourses available in the support center.  Pt expressed interest in several different programs offered in the support center.  FU: Not specific support center follow up is indicated at this time.  Pt stated that she would reach out as needed.   Vaughan Sine Counseling Intern [phone]

## 2015-09-03 NOTE — Telephone Encounter (Signed)
Spoke with pt to confirm appt date/time for Feb. Pt will get an updated calendar on next visit

## 2015-09-07 ENCOUNTER — Encounter: Payer: Self-pay | Admitting: *Deleted

## 2015-09-07 ENCOUNTER — Telehealth (HOSPITAL_COMMUNITY): Payer: Self-pay | Admitting: Vascular Surgery

## 2015-09-07 ENCOUNTER — Other Ambulatory Visit: Payer: 59

## 2015-09-07 ENCOUNTER — Telehealth: Payer: Self-pay | Admitting: *Deleted

## 2015-09-07 NOTE — Telephone Encounter (Signed)
Left pt message to make New pt appt 

## 2015-09-07 NOTE — Telephone Encounter (Signed)
Left message for a return phone call to follow up from [facility] 2/15.

## 2015-09-09 ENCOUNTER — Encounter (HOSPITAL_BASED_OUTPATIENT_CLINIC_OR_DEPARTMENT_OTHER)
Admission: RE | Disposition: A | Discharge status: Home / Self Care | Payer: Self-pay | Source: Ambulatory Visit | Attending: General Surgery

## 2015-09-09 ENCOUNTER — Ambulatory Visit (HOSPITAL_BASED_OUTPATIENT_CLINIC_OR_DEPARTMENT_OTHER): Payer: 59 | Admitting: Anesthesiology

## 2015-09-09 ENCOUNTER — Ambulatory Visit (HOSPITAL_BASED_OUTPATIENT_CLINIC_OR_DEPARTMENT_OTHER)
Admission: RE | Admit: 2015-09-09 | Discharge: 2015-09-09 | Disposition: A | Discharge status: Home / Self Care | Payer: 59 | Source: Ambulatory Visit | Attending: General Surgery | Admitting: General Surgery

## 2015-09-09 ENCOUNTER — Other Ambulatory Visit: Payer: Self-pay | Admitting: *Deleted

## 2015-09-09 ENCOUNTER — Encounter (HOSPITAL_BASED_OUTPATIENT_CLINIC_OR_DEPARTMENT_OTHER): Payer: Self-pay | Admitting: *Deleted

## 2015-09-09 ENCOUNTER — Ambulatory Visit (HOSPITAL_COMMUNITY): Payer: 59

## 2015-09-09 DIAGNOSIS — C50919 Malignant neoplasm of unspecified site of unspecified female breast: Secondary | ICD-10-CM | POA: Insufficient documentation

## 2015-09-09 DIAGNOSIS — Z95828 Presence of other vascular implants and grafts: Secondary | ICD-10-CM

## 2015-09-09 DIAGNOSIS — C50511 Malignant neoplasm of lower-outer quadrant of right female breast: Secondary | ICD-10-CM

## 2015-09-09 DIAGNOSIS — Z419 Encounter for procedure for purposes other than remedying health state, unspecified: Secondary | ICD-10-CM

## 2015-09-09 HISTORY — DX: Other specified postprocedural states: Z98.890

## 2015-09-09 HISTORY — PX: PORTACATH PLACEMENT: SHX2246

## 2015-09-09 HISTORY — DX: Other specified postprocedural states: R11.2

## 2015-09-09 SURGERY — INSERTION, TUNNELED CENTRAL VENOUS DEVICE, WITH PORT
Anesthesia: General | Site: Chest | Laterality: Right

## 2015-09-09 MED ORDER — ONDANSETRON HCL 4 MG/2ML IJ SOLN
INTRAMUSCULAR | Status: DC | PRN
  Administered 2015-09-09: 4 mg via INTRAVENOUS

## 2015-09-09 MED ORDER — FENTANYL CITRATE (PF) 100 MCG/2ML IJ SOLN
25.0000 ug | INTRAMUSCULAR | Status: DC | PRN
  Administered 2015-09-09: 50 ug via INTRAVENOUS
  Administered 2015-09-09: 25 ug via INTRAVENOUS

## 2015-09-09 MED ORDER — LACTATED RINGERS IV SOLN
INTRAVENOUS | Status: DC
  Administered 2015-09-09 (×2): via INTRAVENOUS

## 2015-09-09 MED ORDER — LIDOCAINE HCL (CARDIAC) 20 MG/ML IV SOLN
INTRAVENOUS | Status: DC | PRN
  Administered 2015-09-09: 100 mg via INTRAVENOUS

## 2015-09-09 MED ORDER — HEPARIN (PORCINE) IN NACL 2-0.9 UNIT/ML-% IJ SOLN
INTRAMUSCULAR | Status: AC
  Filled 2015-09-09: qty 500

## 2015-09-09 MED ORDER — FENTANYL CITRATE (PF) 100 MCG/2ML IJ SOLN
50.0000 ug | INTRAMUSCULAR | Status: DC | PRN
  Administered 2015-09-09: 100 ug via INTRAVENOUS

## 2015-09-09 MED ORDER — GLYCOPYRROLATE 0.2 MG/ML IJ SOLN: 0.2000 mg | Freq: Once | INTRAMUSCULAR | Status: DC | PRN

## 2015-09-09 MED ORDER — HEPARIN SOD (PORK) LOCK FLUSH 100 UNIT/ML IV SOLN
INTRAVENOUS | Status: DC | PRN
  Administered 2015-09-09: 500 [IU] via INTRAVENOUS

## 2015-09-09 MED ORDER — HEPARIN (PORCINE) IN NACL 2-0.9 UNIT/ML-% IJ SOLN
INTRAMUSCULAR | Status: DC | PRN
  Administered 2015-09-09: 1 via INTRAVENOUS

## 2015-09-09 MED ORDER — SCOPOLAMINE 1 MG/3DAYS TD PT72
MEDICATED_PATCH | TRANSDERMAL | Status: AC
  Filled 2015-09-09: qty 1

## 2015-09-09 MED ORDER — MEPERIDINE HCL 25 MG/ML IJ SOLN: 6.2500 mg | INTRAMUSCULAR | Status: DC | PRN

## 2015-09-09 MED ORDER — ONDANSETRON HCL 4 MG/2ML IJ SOLN
INTRAMUSCULAR | Status: AC
  Filled 2015-09-09: qty 2

## 2015-09-09 MED ORDER — LIDOCAINE HCL (CARDIAC) 20 MG/ML IV SOLN
INTRAVENOUS | Status: AC
  Filled 2015-09-09: qty 5

## 2015-09-09 MED ORDER — HEPARIN SOD (PORK) LOCK FLUSH 100 UNIT/ML IV SOLN
INTRAVENOUS | Status: AC
  Filled 2015-09-09: qty 5

## 2015-09-09 MED ORDER — MIDAZOLAM HCL 2 MG/2ML IJ SOLN
INTRAMUSCULAR | Status: AC
  Filled 2015-09-09: qty 2

## 2015-09-09 MED ORDER — LACTATED RINGERS IV SOLN: INTRAVENOUS | Status: DC

## 2015-09-09 MED ORDER — MIDAZOLAM HCL 2 MG/2ML IJ SOLN
1.0000 mg | INTRAMUSCULAR | Status: DC | PRN
  Administered 2015-09-09: 2 mg via INTRAVENOUS

## 2015-09-09 MED ORDER — DEXAMETHASONE SODIUM PHOSPHATE 10 MG/ML IJ SOLN
INTRAMUSCULAR | Status: AC
  Filled 2015-09-09: qty 1

## 2015-09-09 MED ORDER — PROMETHAZINE HCL 25 MG/ML IJ SOLN
6.2500 mg | INTRAMUSCULAR | Status: DC | PRN
Start: 2015-09-09 — End: 2015-09-09

## 2015-09-09 MED ORDER — BUPIVACAINE HCL (PF) 0.25 % IJ SOLN
INTRAMUSCULAR | Status: DC | PRN
Start: 2015-09-09 — End: 2015-09-09
  Administered 2015-09-09: 14 mL

## 2015-09-09 MED ORDER — HYDROCODONE-ACETAMINOPHEN 10-325 MG PO TABS: 1.0000 | ORAL_TABLET | Freq: Four times a day (QID) | ORAL | Status: DC | PRN

## 2015-09-09 MED ORDER — PROPOFOL 10 MG/ML IV BOLUS
INTRAVENOUS | Status: DC | PRN
  Administered 2015-09-09: 200 mg via INTRAVENOUS

## 2015-09-09 MED ORDER — FENTANYL CITRATE (PF) 100 MCG/2ML IJ SOLN
INTRAMUSCULAR | Status: AC
  Filled 2015-09-09: qty 2

## 2015-09-09 MED ORDER — SODIUM CHLORIDE 0.9 % IJ SOLN
INTRAMUSCULAR | Status: AC
  Filled 2015-09-09: qty 10

## 2015-09-09 MED ORDER — DEXAMETHASONE SODIUM PHOSPHATE 4 MG/ML IJ SOLN
INTRAMUSCULAR | Status: DC | PRN
  Administered 2015-09-09: 10 mg via INTRAVENOUS

## 2015-09-09 MED ORDER — BUPIVACAINE HCL (PF) 0.25 % IJ SOLN
INTRAMUSCULAR | Status: AC
  Filled 2015-09-09: qty 30

## 2015-09-09 MED ORDER — CEFAZOLIN SODIUM-DEXTROSE 2-3 GM-% IV SOLR
INTRAVENOUS | Status: AC
  Filled 2015-09-09: qty 50

## 2015-09-09 MED ORDER — SCOPOLAMINE 1 MG/3DAYS TD PT72
1.0000 | MEDICATED_PATCH | Freq: Once | TRANSDERMAL | Status: AC | PRN
  Administered 2015-09-09: 1 via TRANSDERMAL

## 2015-09-09 MED ORDER — CEFAZOLIN SODIUM-DEXTROSE 2-3 GM-% IV SOLR
2.0000 g | INTRAVENOUS | Status: AC
  Administered 2015-09-09: 2 g via INTRAVENOUS

## 2015-09-09 SURGICAL SUPPLY — 49 items
APL SKNCLS STERI-STRIP NONHPOA (GAUZE/BANDAGES/DRESSINGS) ×1
BAG DECANTER FOR FLEXI CONT (MISCELLANEOUS) ×3 IMPLANT
BENZOIN TINCTURE PRP APPL 2/3 (GAUZE/BANDAGES/DRESSINGS) ×3 IMPLANT
BLADE SURG 11 STRL SS (BLADE) ×3 IMPLANT
BLADE SURG 15 STRL LF DISP TIS (BLADE) ×1 IMPLANT
BLADE SURG 15 STRL SS (BLADE) ×3
CANISTER SUCT 1200ML W/VALVE (MISCELLANEOUS) IMPLANT
CHLORAPREP W/TINT 26ML (MISCELLANEOUS) ×3 IMPLANT
CLOSURE WOUND 1/2 X4 (GAUZE/BANDAGES/DRESSINGS) ×1
COVER BACK TABLE 60X90IN (DRAPES) ×3 IMPLANT
COVER MAYO STAND STRL (DRAPES) ×3 IMPLANT
COVER PROBE 5X48 (MISCELLANEOUS) ×3
DECANTER SPIKE VIAL GLASS SM (MISCELLANEOUS) IMPLANT
DRAPE C-ARM 42X72 X-RAY (DRAPES) ×3 IMPLANT
DRAPE LAPAROSCOPIC ABDOMINAL (DRAPES) ×3 IMPLANT
DRSG TEGADERM 4X4.75 (GAUZE/BANDAGES/DRESSINGS) IMPLANT
ELECT COATED BLADE 2.86 ST (ELECTRODE) ×3 IMPLANT
ELECT REM PT RETURN 9FT ADLT (ELECTROSURGICAL) ×3
ELECTRODE REM PT RTRN 9FT ADLT (ELECTROSURGICAL) ×1 IMPLANT
GLOVE BIO SURGEON STRL SZ7 (GLOVE) ×3 IMPLANT
GLOVE BIOGEL PI IND STRL 7.5 (GLOVE) ×1 IMPLANT
GLOVE BIOGEL PI INDICATOR 7.5 (GLOVE) ×2
GOWN STRL REUS W/ TWL LRG LVL3 (GOWN DISPOSABLE) ×4 IMPLANT
GOWN STRL REUS W/TWL LRG LVL3 (GOWN DISPOSABLE) ×12
IV KIT MINILOC 20X1 SAFETY (NEEDLE) IMPLANT
KIT CVR 48X5XPRB PLUP LF (MISCELLANEOUS) ×1 IMPLANT
KIT PORT POWER 8FR ISP CVUE (Catheter) ×3 IMPLANT
LIQUID BAND (GAUZE/BANDAGES/DRESSINGS) ×3 IMPLANT
NDL SAFETY ECLIPSE 18X1.5 (NEEDLE) IMPLANT
NEEDLE HYPO 18GX1.5 SHARP (NEEDLE)
NEEDLE HYPO 25X1 1.5 SAFETY (NEEDLE) ×3 IMPLANT
PACK BASIN DAY SURGERY FS (CUSTOM PROCEDURE TRAY) ×3 IMPLANT
PENCIL BUTTON HOLSTER BLD 10FT (ELECTRODE) ×3 IMPLANT
SLEEVE SCD COMPRESS KNEE MED (MISCELLANEOUS) ×3 IMPLANT
SPONGE GAUZE 4X4 12PLY STER LF (GAUZE/BANDAGES/DRESSINGS) ×3 IMPLANT
STRIP CLOSURE SKIN 1/2X4 (GAUZE/BANDAGES/DRESSINGS) ×2 IMPLANT
SUT MON AB 4-0 PC3 18 (SUTURE) ×3 IMPLANT
SUT PROLENE 2 0 SH DA (SUTURE) ×3 IMPLANT
SUT SILK 2 0 TIES 17X18 (SUTURE)
SUT SILK 2-0 18XBRD TIE BLK (SUTURE) IMPLANT
SUT VIC AB 3-0 SH 27 (SUTURE) ×3
SUT VIC AB 3-0 SH 27X BRD (SUTURE) ×1 IMPLANT
SYR 5ML LUER SLIP (SYRINGE) ×3 IMPLANT
SYR CONTROL 10ML LL (SYRINGE) ×3 IMPLANT
TOWEL OR 17X24 6PK STRL BLUE (TOWEL DISPOSABLE) ×3 IMPLANT
TOWEL OR NON WOVEN STRL DISP B (DISPOSABLE) ×3 IMPLANT
TUBE CONNECTING 20'X1/4 (TUBING)
TUBE CONNECTING 20X1/4 (TUBING) IMPLANT
YANKAUER SUCT BULB TIP NO VENT (SUCTIONS) IMPLANT

## 2015-09-09 NOTE — Op Note (Signed)
Preoperative diagnosis: breast cancer need for venous access Postoperative diagnosis: same as above Procedure: right ij US guided powerport insertion Surgeon: Dr Serita Grammes EBL: minimal Anes: general  Specimens none Complications none Drains none Sponge count correct Dispo to pacu stable  Indications: This is a 28 yof with node positive breast cancer undergoing primary systemic therapy. We discussed port placement.   Procedure: After informed consent was obtained the patient was taken to the operating room. She was given antibiotics. Sequential compression devices were on her legs. She was then placed under general anesthesia with an LMA. Then she was then prepped and draped in the standard sterile surgical fashion. Surgical timeout was then performed.  I used the ultrasound to identify the right internal jugular vein. I then accessed the vein using the ultrasound. This aspirated blood. I then placed the wire.This was confirmed by fluoroscopy to be in the correct position. I created a pocket on the right chest. I tunneled the line between the 2 sites. I then dilated the tract and placed the dilator assembly with the sheath. This was done under fluoroscopy. I then removed the sheath and dilator. The wire was also removed. The line was then pulled back to be in the distal cava. I hooked this up to the port. I sutured this into place with 2-0 Prolene in 2 places. This aspirated blood and flushed easily.This was confirmed with a final fluoroscopy. I then closed this with 2-0 Vicryl and 4-0 Monocryl. Dermabond was placed on both the incisions.She tolerated this well and was transferred to the recovery room in stable condition.

## 2015-09-09 NOTE — Anesthesia Postprocedure Evaluation (Signed)
Anesthesia Post Note  Patient: Michele Alvarez  Procedure(s) Performed: Procedure(s) (LRB): INSERTION PORT-A-CATH WITH [place]  (Right)  Patient location during evaluation: PACU Anesthesia Type: General Level of consciousness: awake and alert Pain management: pain level controlled Vital Signs Assessment: post-procedure vital signs reviewed and stable Respiratory status: spontaneous breathing, nonlabored ventilation, respiratory function stable and patient connected to nasal cannula oxygen Cardiovascular status: blood pressure returned to baseline and stable Postop Assessment: no signs of nausea or vomiting Anesthetic complications: no    Last Vitals:  Filed Vitals:   09/09/15 1225 09/09/15 1226  BP: 117/79   Pulse: 87 92  Temp:  36.8 C  Resp:  25    Last Pain: There were no vitals filed for this visit.               Zenaida Deed

## 2015-09-09 NOTE — Interval H&P Note (Signed)
History and Physical Interval Note:  09/09/2015 11:28 AM  [place]  has presented today for surgery, with the diagnosis of breast cancer  The various methods of treatment have been discussed with the patient and family. After consideration of risks, benefits and other options for treatment, the patient has consented to  Procedure(s): INSERTION PORT-A-CATH WITH [place]  (N/A) as a surgical intervention .  The patient's history has been reviewed, patient examined, no change in status, stable for surgery.  I have reviewed the patient's chart and labs.  Questions were answered to the patient's satisfaction.     Wells Gerdeman

## 2015-09-09 NOTE — Anesthesia Procedure Notes (Signed)
Procedure Name: LMA Insertion Date/Time: 09/09/2015 11:50 AM Performed by: Lieutenant Diego Pre-anesthesia Checklist: Patient identified, Emergency Drugs available, Suction available and Patient being monitored Patient Re-evaluated:Patient Re-evaluated prior to inductionOxygen Delivery Method: Circle System Utilized Preoxygenation: Pre-oxygenation with 100% oxygen Intubation Type: IV induction Ventilation: Mask ventilation without difficulty LMA: LMA inserted LMA Size: 4.0 Number of attempts: 1 Airway Equipment and Method: Bite block Placement Confirmation: positive ETCO2 and breath sounds checked- equal and bilateral Tube secured with: Tape Dental Injury: Teeth and Oropharynx as per pre-operative assessment

## 2015-09-09 NOTE — H&P (Signed)
70 yof who underwent screening mm that shows d density breasts. she had no breast complaints prior to mm. there were punctate calcs at 2 oclock posteriorly in the right breast and at 7 oclock in the left breast. magnification views were performed that showed a 1.2 cm irregular mass in the right breast and the cals in the left breast. the left breast was recommended to have 6 month follow up as these appear benign. the right breast then underwent further evaluation. [place] was done with finding of 1.3 cm lobulated mas and a 1.7 cm node in right axilla also. biops was performed of three separate areas. the mass is really in the im fold. the calcs and the mass are total of 2.5 cm. 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 the node is positive. the calcs are dcis prog panel pending. there is no clip at dcis biopsy as it did not deploy. she is here with her partner and her brother today to discuss options.    Other Problems Conni Slipper, RN; 09/01/2015 7:59 AM) Back Pain Breast Cancer Lump In Breast  Past Surgical History Conni Slipper, RN; 09/01/2015 7:59 AM) Spinal Surgery - Neck Spinal Surgery Midback  Diagnostic Studies History Conni Slipper, RN; 09/01/2015 7:59 AM) Colonoscopy never Mammogram within last year Pap Smear 1-5 years ago  Medication History Conni Slipper, RN; 09/01/2015 7:59 AM) No Current Medications Medications Reconciled  Social History Conni Slipper, RN; 09/01/2015 7:59 AM) Alcohol use Occasional alcohol use. Caffeine use Carbonated beverages, Tea. No drug use Tobacco use Never smoker.  Family History Conni Slipper, RN; 09/01/2015 7:59 AM) Ischemic Bowel Disease Mother. Migraine Headache Sister.  Pregnancy / Birth History Conni Slipper, RN; 09/01/2015 7:59 AM) Age at menarche 55 years. Age of menopause 7-50 Gravida 0 Para 0    Review of Systems Conni Slipper RN; 09/01/2015 7:59 AM) General Not Present- Appetite  Loss, Chills, Fatigue, Fever, Night Sweats, Weight Gain and Weight Loss. Skin Not Present- Change in Wart/Mole, Dryness, Hives, Jaundice, New Lesions, Non-Healing Wounds, Rash and Ulcer. HEENT Present- Wears glasses/contact lenses. Not Present- Earache, Hearing Loss, Hoarseness, Nose Bleed, Oral Ulcers, Ringing in the Ears, Seasonal Allergies, Sinus Pain, Sore Throat, Visual Disturbances and Yellow Eyes. Respiratory Not Present- Bloody sputum, Chronic Cough, Difficulty Breathing, Snoring and Wheezing. Breast Present- Breast Mass. Not Present- Breast Pain, Nipple Discharge and Skin Changes. Cardiovascular Present- Leg Cramps. Not Present- Chest Pain, Difficulty Breathing Lying Down, Palpitations, Rapid Heart Rate, Shortness of Breath and Swelling of Extremities. Gastrointestinal Not Present- Abdominal Pain, Bloating, Bloody Stool, Change in Bowel Habits, Chronic diarrhea, Constipation, Difficulty Swallowing, Excessive gas, Gets full quickly at meals, Hemorrhoids, Indigestion, Nausea, Rectal Pain and Vomiting. Female Genitourinary Not Present- Frequency, Nocturia, Painful Urination, Pelvic Pain and Urgency. Musculoskeletal Present- Back Pain. Not Present- Joint Pain, Joint Stiffness, Muscle Pain, Muscle Weakness and Swelling of Extremities. Neurological Present- Numbness. Not Present- Decreased Memory, Fainting, Headaches, Seizures, Tingling, Tremor, Trouble walking and Weakness. Psychiatric Not Present- Anxiety, Bipolar, Change in Sleep Pattern, Depression, Fearful and Frequent crying. Endocrine Not Present- Cold Intolerance, Excessive Hunger, Hair Changes, Heat Intolerance, Hot flashes and New Diabetes. Hematology Not Present- Easy Bruising, Excessive bleeding, Gland problems, HIV and Persistent Infections.   Physical Exam [name] MD; 09/01/2015 9:38 PM) General Mental Status-Alert. Orientation-Oriented X3.  Chest and Lung Exam Chest and lung exam reveals -on auscultation, normal  breath sounds, no adventitious sounds and normal vocal resonance.  Breast Nipples-No Discharge. Breast  Lump-No Palpable Breast Mass.  Cardiovascular Cardiovascular examination reveals -normal heart sounds, regular rate and rhythm with no murmurs.  Lymphatic Head & Neck  General Head & Neck Lymphatics: Bilateral - Description - Normal. Axillary  General Axillary Region: Bilateral - Description - Normal. Note: no [place] adenopathy     Assessment & Plan [name] MD; 09/01/2015 9:46 PM) BREAST CANCER OF LOWER-OUTER QUADRANT OF RIGHT FEMALE BREAST (C50.511) Story: MRI bilateral breasts, primary chemotherapy, port placement We discussed the staging and pathophysiology of breast cancer. We discussed all of the different options for treatment for breast cancer including surgery, chemotherapy, radiation therapy, Herceptin, and antiestrogen therapy. We discussed the options for treatment of the breast cancer which included lumpectomy versus a mastectomy. We discussed the performance of the lumpectomy with radioactive seed placement. We discussed a 5-10% chance of a positive margin requiring reexcision in the operating room. We also discussed that she will likely need radiation therapy (this is usually 5-7 weeks) if she undergoes lumpectomy. The breast cannot undergo more radiation therapy in the same breast after lumpectomy in the future. We discussed the mastectomy (removal of whole breast) and the postoperative care for that as well. Mastectomy can be followed by reconstruction. This is a more extensive surgery and requires more recovery. Most mastectomy patients will not need radiation therapy. We discussed that there is no difference in her survival whether she undergoes lumpectomy or mastectomy. she can have lumpectomy now and she desires this. I dont think will need a new clip placed but will do wire and seed at time of surgery. I do think would benefit from primary systemic therapy  given the node. I think this will likely on alliance trial or off trial do less nodal surgery if she has good response. She understands this rationale and that there is no detrimental effect. will proceed with mri for neo protocol to follow and then discussed port placement

## 2015-09-09 NOTE — Anesthesia Preprocedure Evaluation (Addendum)
Anesthesia Evaluation  Patient identified by MRN, date of birth, ID band Patient awake    Reviewed: Allergy & Precautions, NPO status , Patient's Chart, lab work & pertinent test results  History of Anesthesia Complications (+) PONV and history of anesthetic complications  Airway Mallampati: I  TM Distance: >3 FB Neck ROM: Full    Dental  (+) Teeth Intact   Pulmonary neg pulmonary ROS,    breath sounds clear to auscultation       Cardiovascular negative cardio ROS   Rhythm:Regular Rate:Normal     Neuro/Psych negative neurological ROS  negative psych ROS   GI/Hepatic negative GI ROS, Neg liver ROS,   Endo/Other  negative endocrine ROS  Renal/GU negative Renal ROS  negative genitourinary   Musculoskeletal negative musculoskeletal ROS (+)   Abdominal   Peds negative pediatric ROS (+)  Hematology negative hematology ROS (+)   Anesthesia Other Findings   Reproductive/Obstetrics negative OB ROS                            Lab Results  Component Value Date   WBC 5.6 09/01/2015   HGB 13.3 09/01/2015   HCT 41.1 09/01/2015   MCV 93.4 09/01/2015   PLT 223 09/01/2015   Lab Results  Component Value Date   CREATININE 0.9 09/01/2015   BUN 18.1 09/01/2015   NA 144 09/01/2015   K 3.8 09/01/2015   CO2 27 09/01/2015   No results found for: INR, PROTIME   Anesthesia Physical Anesthesia Plan  ASA: II  Anesthesia Plan: General   Post-op Pain Management:    Induction: Intravenous  Airway Management Planned: LMA  Additional Equipment:   Intra-op Plan:   Post-operative Plan: Extubation in OR  Informed Consent: I have reviewed the patients History and Physical, chart, labs and discussed the procedure including the risks, benefits and alternatives for the proposed anesthesia with the patient or authorized representative who has indicated his/her understanding and acceptance.   Dental  advisory given  Plan Discussed with:   Anesthesia Plan Comments:         Anesthesia Quick Evaluation

## 2015-09-09 NOTE — Discharge Instructions (Signed)

## 2015-09-09 NOTE — Transfer of Care (Signed)
Immediate Anesthesia Transfer of Care Note  Patient: Michele Alvarez  Procedure(s) Performed: Procedure(s): INSERTION PORT-A-CATH WITH [place]  (Right)  Patient Location: PACU  Anesthesia Type:General  Level of Consciousness: sedated  Airway & Oxygen Therapy: Patient Spontanous Breathing and Patient connected to face mask oxygen  Post-op Assessment: Report given to RN and Post -op Vital signs reviewed and stable  Post vital signs: Reviewed and stable  Last Vitals:  Filed Vitals:   09/09/15 1050  BP: 136/74  Pulse: 73  Temp: 36.8 C  Resp: 20    Complications: No apparent anesthesia complications

## 2015-09-10 ENCOUNTER — Encounter: Payer: Self-pay | Admitting: *Deleted

## 2015-09-10 ENCOUNTER — Encounter (HOSPITAL_BASED_OUTPATIENT_CLINIC_OR_DEPARTMENT_OTHER): Payer: Self-pay | Admitting: General Surgery

## 2015-09-10 ENCOUNTER — Ambulatory Visit (HOSPITAL_BASED_OUTPATIENT_CLINIC_OR_DEPARTMENT_OTHER): Payer: 59 | Admitting: Nurse Practitioner

## 2015-09-10 ENCOUNTER — Telehealth: Payer: Self-pay | Admitting: Nurse Practitioner

## 2015-09-10 ENCOUNTER — Other Ambulatory Visit (HOSPITAL_BASED_OUTPATIENT_CLINIC_OR_DEPARTMENT_OTHER): Payer: 59

## 2015-09-10 VITALS — BP 105/68 | HR 69 | Temp 97.9°F | Resp 18 | Ht 68.0 in | Wt 164.9 lb

## 2015-09-10 DIAGNOSIS — C50511 Malignant neoplasm of lower-outer quadrant of right female breast: Secondary | ICD-10-CM

## 2015-09-10 DIAGNOSIS — G629 Polyneuropathy, unspecified: Secondary | ICD-10-CM

## 2015-09-10 DIAGNOSIS — M545 Low back pain: Secondary | ICD-10-CM

## 2015-09-10 DIAGNOSIS — C50811 Malignant neoplasm of overlapping sites of right female breast: Secondary | ICD-10-CM | POA: Diagnosis not present

## 2015-09-10 DIAGNOSIS — C773 Secondary and unspecified malignant neoplasm of axilla and upper limb lymph nodes: Secondary | ICD-10-CM

## 2015-09-10 LAB — CBC WITH DIFFERENTIAL/PLATELET
BASO%: 0 % (ref 0.0–2.0)
BASOS ABS: 0 10*3/uL (ref 0.0–0.1)
EOS%: 0.1 % (ref 0.0–7.0)
Eosinophils Absolute: 0 10*3/uL (ref 0.0–0.5)
HEMATOCRIT: 37.3 % (ref 34.8–46.6)
HGB: 12.2 g/dL (ref 11.6–15.9)
LYMPH#: 1.8 10*3/uL (ref 0.9–3.3)
LYMPH%: 17.6 % (ref 14.0–49.7)
MCH: 30.9 pg (ref 25.1–34.0)
MCHC: 32.7 g/dL (ref 31.5–36.0)
MCV: 94.4 fL (ref 79.5–101.0)
MONO#: 0.8 10*3/uL (ref 0.1–0.9)
MONO%: 7.3 % (ref 0.0–14.0)
NEUT#: 7.8 10*3/uL — ABNORMAL HIGH (ref 1.5–6.5)
NEUT%: 75 % (ref 38.4–76.8)
PLATELETS: 224 10*3/uL (ref 145–400)
RBC: 3.95 10*6/uL (ref 3.70–5.45)
RDW: 13 % (ref 11.2–14.5)
WBC: 10.4 10*3/uL — ABNORMAL HIGH (ref 3.9–10.3)

## 2015-09-10 LAB — COMPREHENSIVE METABOLIC PANEL
ALT: 18 U/L (ref 0–55)
ANION GAP: 7 meq/L (ref 3–11)
AST: 17 U/L (ref 5–34)
Albumin: 4.1 g/dL (ref 3.5–5.0)
Alkaline Phosphatase: 65 U/L (ref 40–150)
BUN: 19 mg/dL (ref 7.0–26.0)
CALCIUM: 9.6 mg/dL (ref 8.4–10.4)
CHLORIDE: 107 meq/L (ref 98–109)
CO2: 28 mEq/L (ref 22–29)
CREATININE: 0.9 mg/dL (ref 0.6–1.1)
EGFR: 77 mL/min/{1.73_m2} — AB (ref >90)
Glucose: 88 mg/dl (ref 70–140)
POTASSIUM: 4 meq/L (ref 3.5–5.1)
Sodium: 142 mEq/L (ref 136–145)
Total Bilirubin: 0.7 mg/dL (ref 0.20–1.20)
Total Protein: 6.7 g/dL (ref 6.4–8.3)

## 2015-09-10 MED ORDER — ONDANSETRON HCL 8 MG PO TABS: 8.0000 mg | ORAL_TABLET | Freq: Two times a day (BID) | ORAL | Status: DC | PRN

## 2015-09-10 MED ORDER — PROCHLORPERAZINE MALEATE 10 MG PO TABS: 10.0000 mg | ORAL_TABLET | Freq: Four times a day (QID) | ORAL | Status: DC | PRN

## 2015-09-10 MED ORDER — DEXAMETHASONE 4 MG PO TABS: ORAL_TABLET | ORAL | Status: DC

## 2015-09-10 MED ORDER — LIDOCAINE-PRILOCAINE 2.5-2.5 % EX CREA
TOPICAL_CREAM | CUTANEOUS | Status: DC
Start: 2015-09-10 — End: 2016-02-11

## 2015-09-10 MED ORDER — LORAZEPAM 0.5 MG PO TABS: 0.5000 mg | ORAL_TABLET | Freq: Four times a day (QID) | ORAL | Status: DC | PRN

## 2015-09-10 MED ORDER — LIDOCAINE-PRILOCAINE 2.5-2.5 % EX CREA: TOPICAL_CREAM | CUTANEOUS | Status: DC

## 2015-09-10 NOTE — Telephone Encounter (Signed)
appt made and avs printed °

## 2015-09-10 NOTE — Progress Notes (Signed)
Michele Alvarez  [phone] [phone]     ID: Michele Alvarez DOB: Dec 24, 1960  MR#: [id number]  TDD#:[id number]  Patient Care Team: Michele Jordan, MD as PCP - General (Family Medicine) Michele Gully, NP as Nurse Practitioner (Obstetrics and Gynecology) Michele Bookbinder, MD as Consulting Physician (General Surgery) Michele Cruel, MD as Consulting Physician (Oncology) Michele Gibson, MD as Attending Physician (Radiation Oncology) Michele Cheese, NP as Nurse Practitioner (Hematology and Oncology) Michele Gamma, MD as Consulting Physician (Neurosurgery) PCP: Michele Coma, MD OTHER MD:  CHIEF COMPLAINT: Node-positive breast cancer  CURRENT TREATMENT: Neoadjuvant chemotherapy   BREAST CANCER HISTORY: "Michele Alvarez" had routine screening mammography at [facility] 08/07/2015. There was an area of calcification in the right breast 2:00 position. There was also an area of calcification in the left breast 7:00 position. She was recalled for bilateral diagnostic mammography 08/12/2015. The left breast calcifications were felt to be likely benign and repeat mammography in 6 months was suggested.  On the right, breast density was category D. There was a 1.2 cm irregular mass at the 6:00, inframammary position and also suspicious calcifications. Adding those together the area measured 2.5 cm. Ultrasound of the right breast on the same day found a 1.3 cm lobulated mass in the right breast at the 6:00 position. There was a 1.7 cm axillary lymph node with a fatty hilum.   On 08/30/2015 the patient underwent biopsy of the breast mass, the abnormal lymph node, and the area of calcifications. The calcifications ([id number]) were associated with ductal carcinoma in situ, with the prognostic panel pending. 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 The  lymph node also was positive.  Note that the clip to the patient's breast mass did not deploy.  The patient's subsequent history is as detailed below  INTERVAL HISTORY: Michele Alvarez returns for follow up of her breast cancer, accompanied by her partner, Michele Alvarez. Michele Alvarez had a port placed yesterday and is here to discuss the antiemetic schedule for doxorubicin and cyclophosphamide, which she will begin next Thursday.  REVIEW OF SYSTEMS: Michele Alvarez's only physical complaint is chronic low back pain and right lower extremity neuropathy. She has burning and sensation changes from her right buttock to the bottom of her right foot. She was on lyrica but this made her too drowsy so she is back to taking nothing for the pain. She can walk with no issues however. The left left is unaffected. Otherwise she denies fevers, chills, nausea, vomiting, or changes in bowel or bladder habits. She is eating well and her energy level is good. She denies shortness of breath, chest pain, cough, or palpitations. A detailed review of systems is otherwise stable.   PAST MEDICAL HISTORY: Past Medical History  Diagnosis Date  . Breast cancer of lower-outer quadrant of right female breast ([place]) 08/19/2015  . Hot flashes   . PONV (postoperative nausea and vomiting)     PAST SURGICAL HISTORY: Past Surgical History  Procedure Laterality Date  . Neck surgery  09-30-99  . Back surgery      x2, lumbar  . Portacath placement Right 09/09/2015    Procedure: INSERTION PORT-A-CATH WITH [place] ;  Surgeon: Michele Bookbinder, MD;  Location: [facility];  Service: General;  Laterality: Right;    FAMILY HISTORY No family history on file. The patient's father died in 09-29-2014 at the age of 44. He had significant COPD.  The patient's mother died at the age of 38 from Parkinson's disease. The patient has 1 brother, 1 sister. There is no cancer history in the family to her knowledge.  GYNECOLOGIC HISTORY:  No LMP recorded. Patient is  postmenopausal. Menarche age 79. She has GI asked P0. She stopped having periods in January 2014. She never took hormone replacement or oral contraceptives.  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 She lives with her partner, Michele Alvarez, and 2 cats.     ADVANCED DIRECTIVES: Not in place. At the 09/01/2015 clinic visit the patient was given the appropriate forms to complete and notarize at her discretion.   HEALTH MAINTENANCE: Social History  Substance Use Topics  . Smoking status: Never Smoker   . Smokeless tobacco: Not on file  . Alcohol Use: Yes     Comment: social     Colonoscopy: Never   PAP:  Bone density: Never  Lipid panel:  Allergies  Allergen Reactions  . Latex Other (See Comments)    Fever blisters  . Acyclovir And Related     Current Outpatient Prescriptions  Medication Sig Dispense Refill  . HYDROcodone-acetaminophen (NORCO) 10-325 MG tablet Take 1 tablet by mouth every 6 (six) hours as needed. 10 tablet 0   No current facility-administered medications for this visit.    OBJECTIVE: Middle-aged white woman who appears younger than stated age  82 Vitals:   09/10/15 1109  BP: 105/68  Pulse: 69  Temp: 97.9 F (36.6 C)  Resp: 18     Body mass index is 25.08 kg/(m^2).    ECOG FS:1 - Symptomatic but completely ambulatory  Skin: warm, dry  HEENT: sclerae anicteric, conjunctivae pink, oropharynx clear. No thrush or mucositis.  Lymph Nodes: No cervical or supraclavicular lymphadenopathy  Lungs: clear to auscultation bilaterally, no rales, wheezes, or rhonci  Heart: regular rate and rhythm  Abdomen: round, soft, non tender, positive bowel sounds  Musculoskeletal: No focal spinal tenderness, no peripheral edema  Neuro: non focal, well oriented, positive affect  Breasts: deferred. Newly placed left upper chest port healing well, mild erythema and bruising    LAB RESULTS:  CMP     Component Value Date/Time   NA 142 09/10/2015 1057   K  4.0 09/10/2015 1057   CO2 28 09/10/2015 1057   GLUCOSE 88 09/10/2015 1057   BUN 19.0 09/10/2015 1057   CREATININE 0.9 09/10/2015 1057   CALCIUM 9.6 09/10/2015 1057   PROT 6.7 09/10/2015 1057   ALBUMIN 4.1 09/10/2015 1057   AST 17 09/10/2015 1057   ALT 18 09/10/2015 1057   ALKPHOS 65 09/10/2015 1057   BILITOT 0.70 09/10/2015 1057    INo results found for: SPEP, UPEP  Lab Results  Component Value Date   WBC 10.4* 09/10/2015   NEUTROABS 7.8* 09/10/2015   HGB 12.2 09/10/2015   HCT 37.3 09/10/2015   MCV 94.4 09/10/2015   PLT 224 09/10/2015      Chemistry      Component Value Date/Time   NA 142 09/10/2015 1057   K 4.0 09/10/2015 1057   CO2 28 09/10/2015 1057   BUN 19.0 09/10/2015 1057   CREATININE 0.9 09/10/2015 1057      Component Value Date/Time   CALCIUM 9.6 09/10/2015 1057   ALKPHOS 65 09/10/2015 1057   AST 17 09/10/2015 1057   ALT 18 09/10/2015 1057   BILITOT 0.70 09/10/2015 1057       No results found for: LABCA2  No components found for: [id number]  No results for input(s): INR in the last 168 hours.  Urinalysis No results found for: COLORURINE, APPEARANCEUR, LABSPEC, PHURINE, GLUCOSEU, HGBUR, BILIRUBINUR, KETONESUR, PROTEINUR, UROBILINOGEN, NITRITE, LEUKOCYTESUR  ELIGIBLE FOR AVAILABLE RESEARCH PROTOCOL: Alliance, PREVENT  STUDIES: Outside studies reviewed   ASSESSMENT: 55 y.o. [place] woman status post right breast and axillary lymph node biopsy 08/30/2015, both positive for a clinical T1-2 N1, stage II invasive ductal carcinoma, grade 3, estrogen and progesterone receptor positive, HER-2/neu nonamplified, with an MIB-1 of 80%  (1) neoadjuvant chemotherapy will consist of doxorubicin and cyclophosphamide and dose dense fashion 4, to be followed by paclitaxel weekly 12  (2) definitive surgery to follow with consideration of the Alliance trial  (3) adjuvant radiation to follow surgery  (4) adjuvant antiestrogen to begin at the completion of local  therapy  PLAN: Michele Alvarez and I spent about 40 minutes discussing her upcoming chemotherapy plans. She was made aware of potential side effects and toxicities of both cyclophosphamide and doxorubicin. She attended chemotherapy school earlier this week so a lot of this information was a review for her. She was made aware of her neulasta injections, including the fact that these will be administered with an onbody injector that she will need to wear until the device is no longer active.   Finally, we reviewed her antiemetic schedule, and she feels comfortable with every medication listed as well as the indication and potential side effects. These prescriptions were sent to her pharmacy today.   Michele Alvarez will return next Thursday for her first cycle of treatment. She understands and agrees with this plan. She knows the goal of treatment in her case is cure. She has been encouraged to call with any issues that might arise before her next visit here.  Laurie Panda, NP   09/10/2015 12:15 PM

## 2015-09-10 NOTE — Progress Notes (Signed)
[place] Social Work  Clinical Social Work was referred by pt  to review and complete healthcare advance directives.  Clinical Social Worker met with patient and friend, Baird [facility] in [place] office.  The patient designated [employer] as their primary healthcare agent and Print production planner as their secondary agent.  Patient also completed healthcare living will.    Clinical Social Worker notarized documents and made copies for patient/family. Clinical Social Worker will send documents to medical records to be scanned into patient's chart. Clinical Social Worker encouraged patient/family to contact with any additional questions or concerns.  Loren Racer, [place] Worker [place]  [place] Phone: [phone] Fax: [phone]

## 2015-09-14 ENCOUNTER — Telehealth: Payer: Self-pay

## 2015-09-14 NOTE — Telephone Encounter (Signed)
Patient called regarding her port placement on 09/08/15, she states that she had an allergic reaction to the dermabond and was calling to see if the port can be used for her first treatment tomorrow.  Patient to see Dr. Donne Hazel today. Per Gentry Fitz, NP it is Dr. Cristal Generous decision on whether the port can be used for treatment tomorrow.  Patient can have her first treatment peripherally if the port cannot be used however she does not want to do that.  If Dr. Donne Hazel doesn't think her port is usable tomorrow she would like to cancel the infusion appt. and reschedule it.   Patient to call back after her appt today with Dr. Donne Hazel today if it is not too late otherwise she will call in the morning to discuss.

## 2015-09-15 ENCOUNTER — Ambulatory Visit (HOSPITAL_COMMUNITY)
Admission: RE | Admit: 2015-09-15 | Discharge: 2015-09-15 | Disposition: A | Discharge status: Home / Self Care | Payer: 59 | Source: Ambulatory Visit | Attending: Cardiology | Admitting: Cardiology

## 2015-09-15 ENCOUNTER — Ambulatory Visit (HOSPITAL_BASED_OUTPATIENT_CLINIC_OR_DEPARTMENT_OTHER): Payer: 59

## 2015-09-15 ENCOUNTER — Encounter (HOSPITAL_COMMUNITY): Payer: Self-pay

## 2015-09-15 ENCOUNTER — Other Ambulatory Visit: Payer: Self-pay

## 2015-09-15 VITALS — BP 106/70 | HR 72 | Wt 160.5 lb

## 2015-09-15 DIAGNOSIS — Z170 Estrogen receptor positive status [ER+]: Secondary | ICD-10-CM | POA: Insufficient documentation

## 2015-09-15 DIAGNOSIS — M549 Dorsalgia, unspecified: Secondary | ICD-10-CM | POA: Insufficient documentation

## 2015-09-15 DIAGNOSIS — C50511 Malignant neoplasm of lower-outer quadrant of right female breast: Secondary | ICD-10-CM | POA: Diagnosis not present

## 2015-09-15 NOTE — Patient Instructions (Signed)
Your physician recommends that you schedule a follow-up appointment in: 6 weeks with echocardiogram  

## 2015-09-15 NOTE — Progress Notes (Signed)
Patient ID: Michele Alvarez, female   DOB: 02-01-1961, 55 y.o.   MRN: [id number] Oncologist: Dr. Jana Hakim  55 yo with minimal past medical history was diagnosed with breast cancer in 2/17.  ER+/PR+/HER2-.  Plan for neoadjuvant chemo with doxorubicin/cyclophosphamide x 4 cycles then paclitaxel weekly x 12 cycles.  This will be followed by definitive surgery and radiation.   She does not have any exertional dyspnea or chest pain. No palpitations.  Only limitation has been back pain.  She walks 45-60 minutes/day for exercise.   I reviewed the echo done today: this was a normal study.   PMH: 1. Back pain. 2. Breast cancer: Diagnosed 2/17.  ER+/PR+/HER2-.  Plan for neoadjuvant chemo with doxorubicin/cyclophosphamide x 4 cycles then paclitaxel weekly x 12 cycles.  This will be followed by definitive surgery and radiation.  - Echo (3/17) with EF 55-60%, lateral s' 9.6, GLS -50.1%, normal diastolic function, normal RV size and systolic function.   SH: Lives in [place], nonsmoker.    FH: No family history of heart problems.   ROS: All systems reviewed and negative except as per HPI.   Current Outpatient Prescriptions  Medication Sig Dispense Refill  . dexamethasone (DECADRON) 4 MG tablet Take 2 tablets by mouth once a day on the day after chemotherapy and then take 2 tablets two times a day for 2 days. Take with food. (Patient not taking: Reported on 09/15/2015) 30 tablet 1  . HYDROcodone-acetaminophen (NORCO) 10-325 MG tablet Take 1 tablet by mouth every 6 (six) hours as needed. (Patient not taking: Reported on 09/15/2015) 10 tablet 0  . lidocaine-prilocaine (EMLA) cream Apply to affected area once (Patient not taking: Reported on 09/15/2015) 30 g 3  . LORazepam (ATIVAN) 0.5 MG tablet Take 1 tablet (0.5 mg total) by mouth every 6 (six) hours as needed (Nausea or vomiting). (Patient not taking: Reported on 09/15/2015) 30 tablet 0  . ondansetron (ZOFRAN) 8 MG tablet Take 1 tablet (8 mg total) by mouth  2 (two) times daily as needed. Start on the third day after chemotherapy. (Patient not taking: Reported on 09/15/2015) 30 tablet 1  . prochlorperazine (COMPAZINE) 10 MG tablet Take 1 tablet (10 mg total) by mouth every 6 (six) hours as needed (Nausea or vomiting). (Patient not taking: Reported on 09/15/2015) 30 tablet 1   No current facility-administered medications for this encounter.   BP 106/70 mmHg  Pulse 72  Wt 160 lb 8 oz (72.802 kg)  SpO2 100% General: NAD Neck: No JVD, no thyromegaly or thyroid nodule.  Lungs: Clear to auscultation bilaterally with normal respiratory effort. CV: Nondisplaced PMI.  Heart regular S1/S2, no S3/S4, no murmur.  No peripheral edema.  No carotid bruit.  Normal pedal pulses.  Abdomen: Soft, nontender, no hepatosplenomegaly, no distention.  Skin: Intact without lesions or rashes.  Neurologic: Alert and oriented x 3.  Psych: Normal affect. Extremities: No clubbing or cyanosis.  HEENT: Normal.   Assessment/Plan: Ms Thal will be starting tomorrow on doxorubicin + cyclophosphamide neoadjuvant chemotherapy.  We discussed the possible cardiac complications of doxorubicin. I reviewed today's echo, this was a normal study.  I will plan on repeating an echo at 6 weeks and then 6 months following that to screen for doxorubicin-related cardiac toxicity.   Loralie Champagne 09/15/2015

## 2015-09-16 ENCOUNTER — Encounter: Payer: Self-pay | Admitting: Oncology

## 2015-09-16 ENCOUNTER — Other Ambulatory Visit: Payer: Self-pay | Admitting: Nurse Practitioner

## 2015-09-16 ENCOUNTER — Other Ambulatory Visit (HOSPITAL_BASED_OUTPATIENT_CLINIC_OR_DEPARTMENT_OTHER): Payer: 59

## 2015-09-16 ENCOUNTER — Ambulatory Visit: Payer: 59

## 2015-09-16 ENCOUNTER — Other Ambulatory Visit: Payer: Self-pay | Admitting: Hematology and Oncology

## 2015-09-16 ENCOUNTER — Ambulatory Visit (HOSPITAL_BASED_OUTPATIENT_CLINIC_OR_DEPARTMENT_OTHER): Payer: 59

## 2015-09-16 VITALS — BP 110/60 | HR 68 | Temp 98.1°F

## 2015-09-16 DIAGNOSIS — C50511 Malignant neoplasm of lower-outer quadrant of right female breast: Secondary | ICD-10-CM

## 2015-09-16 DIAGNOSIS — Z5111 Encounter for antineoplastic chemotherapy: Secondary | ICD-10-CM | POA: Diagnosis not present

## 2015-09-16 DIAGNOSIS — Z95828 Presence of other vascular implants and grafts: Secondary | ICD-10-CM

## 2015-09-16 LAB — COMPREHENSIVE METABOLIC PANEL
ALBUMIN: 4.2 g/dL (ref 3.5–5.0)
ALK PHOS: 76 U/L (ref 40–150)
ALT: 23 U/L (ref 0–55)
AST: 18 U/L (ref 5–34)
Anion Gap: 10 mEq/L (ref 3–11)
BILIRUBIN TOTAL: 0.52 mg/dL (ref 0.20–1.20)
BUN: 16.2 mg/dL (ref 7.0–26.0)
CALCIUM: 9.5 mg/dL (ref 8.4–10.4)
CO2: 25 mEq/L (ref 22–29)
Chloride: 108 mEq/L (ref 98–109)
Creatinine: 0.8 mg/dL (ref 0.6–1.1)
EGFR: 82 mL/min/{1.73_m2} — AB (ref >90)
Glucose: 111 mg/dl (ref 70–140)
POTASSIUM: 3.9 meq/L (ref 3.5–5.1)
Sodium: 142 mEq/L (ref 136–145)
TOTAL PROTEIN: 7 g/dL (ref 6.4–8.3)

## 2015-09-16 LAB — CBC WITH DIFFERENTIAL/PLATELET
BASO%: 0.6 % (ref 0.0–2.0)
BASOS ABS: 0 10*3/uL (ref 0.0–0.1)
EOS ABS: 0.2 10*3/uL (ref 0.0–0.5)
EOS%: 3.4 % (ref 0.0–7.0)
HEMATOCRIT: 38.6 % (ref 34.8–46.6)
HGB: 12.8 g/dL (ref 11.6–15.9)
LYMPH%: 36.6 % (ref 14.0–49.7)
MCH: 30.9 pg (ref 25.1–34.0)
MCHC: 33.2 g/dL (ref 31.5–36.0)
MCV: 93.2 fL (ref 79.5–101.0)
MONO#: 0.5 10*3/uL (ref 0.1–0.9)
MONO%: 9.3 % (ref 0.0–14.0)
NEUT#: 2.5 10*3/uL (ref 1.5–6.5)
NEUT%: 50.1 % (ref 38.4–76.8)
Platelets: 237 10*3/uL (ref 145–400)
RBC: 4.14 10*6/uL (ref 3.70–5.45)
RDW: 12.9 % (ref 11.2–14.5)
WBC: 4.9 10*3/uL (ref 3.9–10.3)
lymph#: 1.8 10*3/uL (ref 0.9–3.3)

## 2015-09-16 MED ORDER — FOSAPREPITANT DIMEGLUMINE INJECTION 150 MG
Freq: Once | INTRAVENOUS | Status: AC
  Administered 2015-09-16: 14:00:00 via INTRAVENOUS
  Filled 2015-09-16: qty 5

## 2015-09-16 MED ORDER — PEGFILGRASTIM 6 MG/0.6ML ~~LOC~~ PSKT
6.0000 mg | PREFILLED_SYRINGE | Freq: Once | SUBCUTANEOUS | Status: AC
  Administered 2015-09-16: 6 mg via SUBCUTANEOUS
  Filled 2015-09-16: qty 0.6

## 2015-09-16 MED ORDER — HEPARIN SOD (PORK) LOCK FLUSH 100 UNIT/ML IV SOLN
500.0000 [IU] | Freq: Once | INTRAVENOUS | Status: AC | PRN
  Administered 2015-09-16: 500 [IU]
  Filled 2015-09-16: qty 5

## 2015-09-16 MED ORDER — CYCLOPHOSPHAMIDE CHEMO INJECTION 1 GM
600.0000 mg/m2 | Freq: Once | INTRAMUSCULAR | Status: AC
  Administered 2015-09-16: 1140 mg via INTRAVENOUS
  Filled 2015-09-16: qty 57

## 2015-09-16 MED ORDER — DOXORUBICIN HCL CHEMO IV INJECTION 2 MG/ML
60.0000 mg/m2 | Freq: Once | INTRAVENOUS | Status: AC
  Administered 2015-09-16: 114 mg via INTRAVENOUS
  Filled 2015-09-16: qty 57

## 2015-09-16 MED ORDER — SODIUM CHLORIDE 0.9% FLUSH
10.0000 mL | INTRAVENOUS | Status: DC | PRN
  Administered 2015-09-16: 10 mL
  Filled 2015-09-16: qty 10

## 2015-09-16 MED ORDER — PALONOSETRON HCL INJECTION 0.25 MG/5ML
INTRAVENOUS | Status: AC
  Filled 2015-09-16: qty 5

## 2015-09-16 MED ORDER — PALONOSETRON HCL INJECTION 0.25 MG/5ML
0.2500 mg | Freq: Once | INTRAVENOUS | Status: AC
  Administered 2015-09-16: 0.25 mg via INTRAVENOUS

## 2015-09-16 MED ORDER — SODIUM CHLORIDE 0.9% FLUSH
10.0000 mL | INTRAVENOUS | Status: DC | PRN
Start: 2015-09-16 — End: 2015-09-16
  Administered 2015-09-16: 10 mL via INTRAVENOUS
  Filled 2015-09-16: qty 10

## 2015-09-16 MED ORDER — SODIUM CHLORIDE 0.9 % IV SOLN
Freq: Once | INTRAVENOUS | Status: AC
  Administered 2015-09-16: 13:00:00 via INTRAVENOUS

## 2015-09-16 NOTE — Patient Instructions (Addendum)
[facility] Discharge Instructions for Patients Receiving Chemotherapy  Today you received the following chemotherapy agents: Adriamycin and Cytoxan.  To help prevent nausea and vomiting after your treatment, we encourage you to take your nausea medication: Zofran 8mg  every 12 hours as needed;  Compazine 10 mg every 6 hours as needed.   If you develop nausea and vomiting that is not controlled by your nausea medication, call the clinic.   BELOW ARE SYMPTOMS THAT SHOULD BE REPORTED IMMEDIATELY:  *FEVER GREATER THAN 100.5 F  *CHILLS WITH OR WITHOUT FEVER  NAUSEA AND VOMITING THAT IS NOT CONTROLLED WITH YOUR NAUSEA MEDICATION  *UNUSUAL SHORTNESS OF BREATH  *UNUSUAL BRUISING OR BLEEDING  TENDERNESS IN MOUTH AND THROAT WITH OR WITHOUT PRESENCE OF ULCERS  *URINARY PROBLEMS  *BOWEL PROBLEMS  UNUSUAL RASH Items with * indicate a potential emergency and should be followed up as soon as possible.  Feel free to call the clinic you have any questions or concerns. The clinic phone number is [phone].  Please show the [place] at check-in to the Emergency Department and triage nurse.  Doxorubicin injection What is this medicine? DOXORUBICIN (dox oh ROO bi sin) is a chemotherapy drug. It is used to treat many kinds of cancer like Hodgkin's disease, leukemia, non-Hodgkin's lymphoma, neuroblastoma, sarcoma, and Wilms' tumor. It is also used to treat bladder cancer, breast cancer, lung cancer, ovarian cancer, stomach cancer, and thyroid cancer. This medicine may be used for other purposes; ask your health care provider or pharmacist if you have questions. What should I tell my health care provider before I take this medicine? They need to know if you have any of these conditions: -blood disorders -heart disease, recent heart attack -infection (especially a virus infection such as chickenpox, cold sores, or herpes) -irregular heartbeat -liver disease -recent or  ongoing radiation therapy -an unusual or allergic reaction to doxorubicin, other chemotherapy agents, other medicines, foods, dyes, or preservatives -pregnant or trying to get pregnant -breast-feeding How should I use this medicine? This drug is given as an infusion into a vein. It is administered in a hospital or clinic by a specially trained health care professional. If you have pain, swelling, burning or any unusual feeling around the site of your injection, tell your health care professional right away. Talk to your pediatrician regarding the use of this medicine in children. Special care may be needed. Overdosage: If you think you have taken too much of this medicine contact a poison control center or emergency room at once. NOTE: This medicine is only for you. Do not share this medicine with others. What if I miss a dose? It is important not to miss your dose. Call your doctor or health care professional if you are unable to keep an appointment. What may interact with this medicine? Do not take this medicine with any of the following medications: -cisapride -droperidol -halofantrine -pimozide -zidovudine This medicine may also interact with the following medications: -chloroquine -chlorpromazine -clarithromycin -cyclophosphamide -cyclosporine -erythromycin -medicines for depression, anxiety, or psychotic disturbances -medicines for irregular heart beat like amiodarone, bepridil, dofetilide, encainide, flecainide, propafenone, quinidine -medicines for seizures like ethotoin, fosphenytoin, phenytoin -medicines for nausea, vomiting like dolasetron, ondansetron, palonosetron -medicines to increase blood counts like filgrastim, pegfilgrastim, sargramostim -methadone -methotrexate -pentamidine -progesterone -vaccines -verapamil Talk to your doctor or health care professional before taking any of these medicines: -acetaminophen -aspirin -ibuprofen -ketoprofen -naproxen This  list may not describe all possible interactions. Give your health care provider a list  of all the medicines, herbs, non-prescription drugs, or dietary supplements you use. Also tell them if you smoke, drink alcohol, or use illegal drugs. Some items may interact with your medicine. What should I watch for while using this medicine? Your condition will be monitored carefully while you are receiving this medicine. You will need important blood work done while you are taking this medicine. This drug may make you feel generally unwell. This is not uncommon, as chemotherapy can affect healthy cells as well as cancer cells. Report any side effects. Continue your course of treatment even though you feel ill unless your doctor tells you to stop. Your urine may turn red for a few days after your dose. This is not blood. If your urine is dark or brown, call your doctor. In some cases, you may be given additional medicines to help with side effects. Follow all directions for their use. Call your doctor or health care professional for advice if you get a fever, chills or sore throat, or other symptoms of a cold or flu. Do not treat yourself. This drug decreases your body's ability to fight infections. Try to avoid being around people who are sick. This medicine may increase your risk to bruise or bleed. Call your doctor or health care professional if you notice any unusual bleeding. Be careful brushing and flossing your teeth or using a toothpick because you may get an infection or bleed more easily. If you have any dental work done, tell your dentist you are receiving this medicine. Avoid taking products that contain aspirin, acetaminophen, ibuprofen, naproxen, or ketoprofen unless instructed by your doctor. These medicines may hide a fever. Men and women of childbearing age should use effective birth control methods while using taking this medicine. Do not become pregnant while taking this medicine. There is a  potential for serious side effects to an unborn child. Talk to your health care professional or pharmacist for more information. Do not breast-feed an infant while taking this medicine. Do not let others touch your urine or other body fluids for 5 days after each treatment with this medicine. Caregivers should wear latex gloves to avoid touching body fluids during this time. There is a maximum amount of this medicine you should receive throughout your life. The amount depends on the medical condition being treated and your overall health. Your doctor will watch how much of this medicine you receive in your lifetime. Tell your doctor if you have taken this medicine before. What side effects may I notice from receiving this medicine? Side effects that you should report to your doctor or health care professional as soon as possible: -allergic reactions like skin rash, itching or hives, swelling of the face, lips, or tongue -low blood counts - this medicine may decrease the number of white blood cells, red blood cells and platelets. You may be at increased risk for infections and bleeding. -signs of infection - fever or chills, cough, sore throat, pain or difficulty passing urine -signs of decreased platelets or bleeding - bruising, pinpoint red spots on the skin, black, tarry stools, blood in the urine -signs of decreased red blood cells - unusually weak or tired, fainting spells, lightheadedness -breathing problems -chest pain -fast, irregular heartbeat -mouth sores -nausea, vomiting -pain, swelling, redness at site where injected -pain, tingling, numbness in the hands or feet -swelling of ankles, feet, or hands -unusual bleeding or bruising Side effects that usually do not require medical attention (report to your doctor or health care professional  if they continue or are bothersome): -diarrhea -facial flushing -hair loss -loss of appetite -missed menstrual periods -nail discoloration or  damage -red or watery eyes -red colored urine -stomach upset This list may not describe all possible side effects. Call your doctor for medical advice about side effects. You may report side effects to FDA at 1-800-FDA-1088. Where should I keep my medicine? This drug is given in a hospital or clinic and will not be stored at home. NOTE: This sheet is a summary. It may not cover all possible information. If you have questions about this medicine, talk to your doctor, pharmacist, or health care provider.    2016, [employer]. (2012-10-29 09:54:34)   Cyclophosphamide injection What is this medicine? CYCLOPHOSPHAMIDE (sye kloe FOSS fa mide) is a chemotherapy drug. It slows the growth of cancer cells. This medicine is used to treat many types of cancer like lymphoma, myeloma, leukemia, breast cancer, and ovarian cancer, to name a few. This medicine may be used for other purposes; ask your health care provider or pharmacist if you have questions. What should I tell my health care provider before I take this medicine? They need to know if you have any of these conditions: -blood disorders -history of other chemotherapy -infection -kidney disease -liver disease -recent or ongoing radiation therapy -tumors in the bone marrow -an unusual or allergic reaction to cyclophosphamide, other chemotherapy, other medicines, foods, dyes, or preservatives -pregnant or trying to get pregnant -breast-feeding How should I use this medicine? This drug is usually given as an injection into a vein or muscle or by infusion into a vein. It is administered in a hospital or clinic by a specially trained health care professional. Talk to your pediatrician regarding the use of this medicine in children. Special care may be needed. Overdosage: If you think you have taken too much of this medicine contact a poison control center or emergency room at once. NOTE: This medicine is only for you. Do not share  this medicine with others. What if I miss a dose? It is important not to miss your dose. Call your doctor or health care professional if you are unable to keep an appointment. What may interact with this medicine? This medicine may interact with the following medications: -amiodarone -amphotericin B -azathioprine -certain antiviral medicines for HIV or AIDS such as protease inhibitors (e.g., indinavir, ritonavir) and zidovudine -certain blood pressure medications such as benazepril, captopril, enalapril, fosinopril, lisinopril, moexipril, monopril, perindopril, quinapril, ramipril, trandolapril -certain cancer medications such as anthracyclines (e.g., daunorubicin, doxorubicin), busulfan, cytarabine, paclitaxel, pentostatin, tamoxifen, trastuzumab -certain diuretics such as chlorothiazide, chlorthalidone, hydrochlorothiazide, indapamide, metolazone -certain medicines that treat or prevent blood clots like warfarin -certain muscle relaxants such as succinylcholine -cyclosporine -etanercept -indomethacin -medicines to increase blood counts like filgrastim, pegfilgrastim, sargramostim -medicines used as general anesthesia -metronidazole -natalizumab This list may not describe all possible interactions. Give your health care provider a list of all the medicines, herbs, non-prescription drugs, or dietary supplements you use. Also tell them if you smoke, drink alcohol, or use illegal drugs. Some items may interact with your medicine. What should I watch for while using this medicine? Visit your doctor for checks on your progress. This drug may make you feel generally unwell. This is not uncommon, as chemotherapy can affect healthy cells as well as cancer cells. Report any side effects. Continue your course of treatment even though you feel ill unless your doctor tells you to stop. Drink water or other fluids as directed. Urinate often,  even at night. In some cases, you may be given additional  medicines to help with side effects. Follow all directions for their use. Call your doctor or health care professional for advice if you get a fever, chills or sore throat, or other symptoms of a cold or flu. Do not treat yourself. This drug decreases your body's ability to fight infections. Try to avoid being around people who are sick. This medicine may increase your risk to bruise or bleed. Call your doctor or health care professional if you notice any unusual bleeding. Be careful brushing and flossing your teeth or using a toothpick because you may get an infection or bleed more easily. If you have any dental work done, tell your dentist you are receiving this medicine. You may get drowsy or dizzy. Do not drive, use machinery, or do anything that needs mental alertness until you know how this medicine affects you. Do not become pregnant while taking this medicine or for 1 year after stopping it. Women should inform their doctor if they wish to become pregnant or think they might be pregnant. Men should not father a child while taking this medicine and for 4 months after stopping it. There is a potential for serious side effects to an unborn child. Talk to your health care professional or pharmacist for more information. Do not breast-feed an infant while taking this medicine. This medicine may interfere with the ability to have a child. This medicine has caused ovarian failure in some women. This medicine has caused reduced sperm counts in some men. You should talk with your doctor or health care professional if you are concerned about your fertility. If you are going to have surgery, tell your doctor or health care professional that you have taken this medicine. What side effects may I notice from receiving this medicine? Side effects that you should report to your doctor or health care professional as soon as possible: -allergic reactions like skin rash, itching or hives, swelling of the face, lips,  or tongue -low blood counts - this medicine may decrease the number of white blood cells, red blood cells and platelets. You may be at increased risk for infections and bleeding. -signs of infection - fever or chills, cough, sore throat, pain or difficulty passing urine -signs of decreased platelets or bleeding - bruising, pinpoint red spots on the skin, black, tarry stools, blood in the urine -signs of decreased red blood cells - unusually weak or tired, fainting spells, lightheadedness -breathing problems -dark urine -dizziness -palpitations -swelling of the ankles, feet, hands -trouble passing urine or change in the amount of urine -weight gain -yellowing of the eyes or skin Side effects that usually do not require medical attention (report to your doctor or health care professional if they continue or are bothersome): -changes in nail or skin color -hair loss -missed menstrual periods -mouth sores -nausea, vomiting This list may not describe all possible side effects. Call your doctor for medical advice about side effects. You may report side effects to FDA at 1-800-FDA-1088. Where should I keep my medicine? This drug is given in a hospital or clinic and will not be stored at home. NOTE: This sheet is a summary. It may not cover all possible information. If you have questions about this medicine, talk to your doctor, pharmacist, or health care provider.    2016, [employer]. (2012-05-17 16:22:58)

## 2015-09-16 NOTE — Progress Notes (Signed)
Met w/ pt regarding copay assistance.  She feels she has met her deductible for the year.  If so, she will not need copay assistance but informed her if she's left with a balance I can always go back and apply in her behalf.  She verbalized understanding.  Offered the [employer], went over what it will and will not cover and gave her an expense sheet.  She will provide me with the last 2 check stubs to see if she qualifies.  She has my card for any questions or concerns.

## 2015-09-16 NOTE — Patient Instructions (Signed)

## 2015-09-17 ENCOUNTER — Telehealth: Payer: Self-pay

## 2015-09-17 ENCOUNTER — Encounter: Payer: Self-pay | Admitting: *Deleted

## 2015-09-17 NOTE — Telephone Encounter (Signed)
Patient states that she had mild abdominal pain last night before bed, but reports that she is doing well today.  States that she is taking Compazine 10 mg every 6 hours as directed; last taken at 8:30 am and she is scheduled to take next pill at 14:30.  States that she has been active with ADLs today with no complaints of nausea, fever, shortness of breath, or abdominal pain.  Patient told to call office if she experiences any problems to contact office or go to ED.  Patient verbalized understanding.

## 2015-09-17 NOTE — Telephone Encounter (Signed)
-----   Message from Otila Kluver, RN sent at 09/16/2015  4:22 PM EST ----- Regarding: [place] 1st a/c Contact: [phone] 1st time adriamycin/Cytoxan. Tolerated well. No evidence of reaction. Ask about skin around [facility] as she had allergic reaction to Dermabond from port insertion.  Drucie Ip, RN

## 2015-09-23 ENCOUNTER — Ambulatory Visit (HOSPITAL_BASED_OUTPATIENT_CLINIC_OR_DEPARTMENT_OTHER): Payer: 59 | Admitting: Oncology

## 2015-09-23 ENCOUNTER — Other Ambulatory Visit (HOSPITAL_BASED_OUTPATIENT_CLINIC_OR_DEPARTMENT_OTHER): Payer: 59

## 2015-09-23 VITALS — BP 109/66 | HR 76 | Temp 97.9°F | Resp 18 | Ht 68.0 in | Wt 161.3 lb

## 2015-09-23 DIAGNOSIS — C50811 Malignant neoplasm of overlapping sites of right female breast: Secondary | ICD-10-CM

## 2015-09-23 DIAGNOSIS — C50511 Malignant neoplasm of lower-outer quadrant of right female breast: Secondary | ICD-10-CM

## 2015-09-23 DIAGNOSIS — C773 Secondary and unspecified malignant neoplasm of axilla and upper limb lymph nodes: Secondary | ICD-10-CM

## 2015-09-23 DIAGNOSIS — K219 Gastro-esophageal reflux disease without esophagitis: Secondary | ICD-10-CM | POA: Diagnosis not present

## 2015-09-23 LAB — CBC WITH DIFFERENTIAL/PLATELET
BASO%: 0.5 % (ref 0.0–2.0)
BASOS ABS: 0 10*3/uL (ref 0.0–0.1)
EOS ABS: 0.1 10*3/uL (ref 0.0–0.5)
EOS%: 5.3 % (ref 0.0–7.0)
HEMATOCRIT: 38 % (ref 34.8–46.6)
HEMOGLOBIN: 12.4 g/dL (ref 11.6–15.9)
LYMPH%: 57.9 % — ABNORMAL HIGH (ref 14.0–49.7)
MCH: 30.5 pg (ref 25.1–34.0)
MCHC: 32.6 g/dL (ref 31.5–36.0)
MCV: 93.6 fL (ref 79.5–101.0)
MONO#: 0.2 10*3/uL (ref 0.1–0.9)
MONO%: 8.4 % (ref 0.0–14.0)
NEUT%: 27.9 % — ABNORMAL LOW (ref 38.4–76.8)
NEUTROS ABS: 0.5 10*3/uL — AB (ref 1.5–6.5)
NRBC: 0 % (ref 0–0)
Platelets: 116 10*3/uL — ABNORMAL LOW (ref 145–400)
RBC: 4.06 10*6/uL (ref 3.70–5.45)
RDW: 12.7 % (ref 11.2–14.5)
WBC: 1.9 10*3/uL — ABNORMAL LOW (ref 3.9–10.3)
lymph#: 1.1 10*3/uL (ref 0.9–3.3)

## 2015-09-23 LAB — COMPREHENSIVE METABOLIC PANEL
ALBUMIN: 3.8 g/dL (ref 3.5–5.0)
ALK PHOS: 95 U/L (ref 40–150)
ALT: 35 U/L (ref 0–55)
AST: 14 U/L (ref 5–34)
Anion Gap: 8 mEq/L (ref 3–11)
BUN: 20.6 mg/dL (ref 7.0–26.0)
CALCIUM: 9.1 mg/dL (ref 8.4–10.4)
CHLORIDE: 104 meq/L (ref 98–109)
CO2: 27 mEq/L (ref 22–29)
CREATININE: 0.8 mg/dL (ref 0.6–1.1)
EGFR: 87 mL/min/{1.73_m2} — ABNORMAL LOW (ref >90)
GLUCOSE: 102 mg/dL (ref 70–140)
Potassium: 4.7 mEq/L (ref 3.5–5.1)
Sodium: 139 mEq/L (ref 136–145)
Total Bilirubin: 0.66 mg/dL (ref 0.20–1.20)
Total Protein: 6.3 g/dL — ABNORMAL LOW (ref 6.4–8.3)

## 2015-09-23 MED ORDER — OMEPRAZOLE 40 MG PO CPDR: 40.0000 mg | DELAYED_RELEASE_CAPSULE | Freq: Every day | ORAL | Status: DC

## 2015-09-23 NOTE — Progress Notes (Signed)
[place]  Telephone:([phone]) [phone] Fax:(336) [phone]     ID: Michele Alvarez DOB: [birth year]  MR#: [id number]  PJS#:[id number]  Patient Care Team: Jonathon Jordan, MD as PCP - General (Family Medicine) Avon Gully, NP as Nurse Practitioner (Obstetrics and Gynecology) [name], MD as Consulting Physician (General Surgery) Chauncey Cruel, MD as Consulting Physician (Oncology) Eppie Gibson, MD as Attending Physician (Radiation Oncology) Sylvan Cheese, NP as Nurse Practitioner (Hematology and Oncology) Jovita Gamma, MD as Consulting Physician (Neurosurgery) PCP: Lilian Coma, MD OTHER MD:  CHIEF COMPLAINT: Node-positive breast cancer  CURRENT TREATMENT: Neoadjuvant chemotherapy   BREAST CANCER HISTORY: From the original intake note:  "Michele Alvarez" had routine screening mammography at [facility] 08/07/2015. There was an area of calcification in the right breast 2:00 position. There was also an area of calcification in the left breast 7:00 position. She was recalled for bilateral diagnostic mammography 08/12/2015. The left breast calcifications were felt to be likely benign and repeat mammography in 6 months was suggested.  On the right, breast density was category D. There was a 1.2 cm irregular mass at the 6:00, inframammary position and also suspicious calcifications. Adding those together the area measured 2.5 cm. Ultrasound of the right breast on the same day found a 1.3 cm lobulated mass in the right breast at the 6:00 position. There was a 1.7 cm axillary lymph node with a fatty hilum.   On 08/30/2015 the patient underwent biopsy of the breast mass, the abnormal lymph node, and the area of calcifications. The calcifications ([id number]) were associated with ductal carcinoma in situ, with the prognostic panel pending. 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 The lymph node also was positive.  Note that the clip to the patient's breast mass did not deploy.  The patient's subsequent history is as detailed below  INTERVAL HISTORY: Michele Alvarez returns for follow up of her estrogen receptor positive breast cancer, accompanied by her partner, [name]. Today is day 8 cycle 1 of 4 planned cycles of doxorubicin and cyclophosphamide given in dose dense fashion to be followed by paclitaxel weekly 12   REVIEW OF SYSTEMS: Michele Alvarez did remarkably well with her first cycle of chemotherapy. On day 1 at night while sitting up in bed watching TV she developed a significant epigastric pain. There was no nausea or vomiting. She took Ativan for this but it made her very woozy and she does not like that medication so she did not take it and subsequent notes. Despite the Decadron she was able to sleep well. She did excellently on days 2 and 3 but on day 4, Sunday, she was very lethargic. Note that she still took dexamethasone 8 mg the morning. Monday she was back to work. She had a little bit of trouble focusing but that has improved. She never had any mouth sores. She has had no fever or rash. She has had some change of taste and no longer likes sweets, no upper first salty foods. Aside from these issues a detailed review of systems today was benign  PAST MEDICAL HISTORY: Past Medical History  Diagnosis Date  . Breast cancer of lower-outer quadrant of right female breast ([place]) 08/19/2015  . Hot flashes   . PONV (postoperative nausea and vomiting)     PAST SURGICAL HISTORY: Past Surgical History  Procedure Laterality Date  . Neck surgery  2001  . Back surgery  x2, lumbar  . Portacath placement Right 09/09/2015    Procedure: INSERTION PORT-A-CATH WITH [place] ;  Surgeon: [name], MD;  Location: [facility];  Service: General;  Laterality: Right;    FAMILY HISTORY No family history on file. The patient's father died in  09/24/2014 at the age of 75. He had significant COPD. The patient's mother died at the age of 82 from Parkinson's disease. The patient has 1 brother, 1 sister. There is no cancer history in the family to her knowledge.  GYNECOLOGIC HISTORY:  No LMP recorded. Patient is postmenopausal. Menarche age 52. She has GI asked P0. She stopped having periods in January 2014. She never took hormone replacement or oral contraceptives.  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 She lives with her partner, [name], and 2 cats.     ADVANCED DIRECTIVES: Not in place. At the 09/01/2015 clinic visit the patient was given the appropriate forms to complete and notarize at her discretion.   HEALTH MAINTENANCE: Social History  Substance Use Topics  . Smoking status: Never Smoker   . Smokeless tobacco: Not on file  . Alcohol Use: Yes     Comment: social     Colonoscopy: Never   PAP:  Bone density: Never  Lipid panel:  Allergies  Allergen Reactions  . Latex Other (See Comments)    Fever blisters  . Acyclovir And Related   . Skin Adhesives Dermatitis    Reaction to dermabond, do not use    Current Outpatient Prescriptions  Medication Sig Dispense Refill  . dexamethasone (DECADRON) 4 MG tablet Take 2 tablets by mouth once a day on the day after chemotherapy and then take 2 tablets two times a day for 2 days. Take with food. (Patient not taking: Reported on 09/15/2015) 30 tablet 1  . HYDROcodone-acetaminophen (NORCO) 10-325 MG tablet Take 1 tablet by mouth every 6 (six) hours as needed. (Patient not taking: Reported on 09/15/2015) 10 tablet 0  . lidocaine-prilocaine (EMLA) cream Apply to affected area once (Patient not taking: Reported on 09/15/2015) 30 g 3  . LORazepam (ATIVAN) 0.5 MG tablet Take 1 tablet (0.5 mg total) by mouth every 6 (six) hours as needed (Nausea or vomiting). (Patient not taking: Reported on 09/15/2015) 30 tablet 0  . ondansetron (ZOFRAN) 8 MG tablet Take 1 tablet (8 mg total)  by mouth 2 (two) times daily as needed. Start on the third day after chemotherapy. (Patient not taking: Reported on 09/15/2015) 30 tablet 1  . prochlorperazine (COMPAZINE) 10 MG tablet Take 1 tablet (10 mg total) by mouth every 6 (six) hours as needed (Nausea or vomiting). (Patient not taking: Reported on 09/15/2015) 30 tablet 1   No current facility-administered medications for this visit.    OBJECTIVE: Middle-aged white woman In no acute distress Filed Vitals:   09/23/15 1219  BP: 109/66  Pulse: 76  Temp: 97.9 F (36.6 C)  Resp: 18     Body mass index is 24.53 kg/(m^2).    ECOG FS:1 - Symptomatic but completely ambulatory  Sclerae unicteric, pupils round and equal Oropharynx clear and moist-- no thrush or other lesions No cervical or supraclavicular adenopathy Lungs no rales or rhonchi Heart regular rate and rhythm Abd soft, nontender, positive bowel sounds MSK no focal spinal tenderness, no upper extremity lymphedema Neuro: nonfocal, well oriented, appropriate affect Breasts: The right breast ecchymosis previously noted has almost completely resolved. The right axilla is benign. The left breast is unremarkable.   LAB RESULTS:  CMP     Component Value Date/Time   NA 142 09/16/2015 1211   K 3.9 09/16/2015 1211   CO2 25 09/16/2015 1211   GLUCOSE 111 09/16/2015 1211   BUN 16.2 09/16/2015 1211   CREATININE 0.8 09/16/2015 1211   CALCIUM 9.5 09/16/2015 1211   PROT 7.0 09/16/2015 1211   ALBUMIN 4.2 09/16/2015 1211   AST 18 09/16/2015 1211   ALT 23 09/16/2015 1211   ALKPHOS 76 09/16/2015 1211   BILITOT 0.52 09/16/2015 1211    INo results found for: SPEP, UPEP  Lab Results  Component Value Date   WBC 1.9* 09/23/2015   NEUTROABS 0.5* 09/23/2015   HGB 12.4 09/23/2015   HCT 38.0 09/23/2015   MCV 93.6 09/23/2015   PLT 116* 09/23/2015      Chemistry      Component Value Date/Time   NA 142 09/16/2015 1211   K 3.9 09/16/2015 1211   CO2 25 09/16/2015 1211   BUN 16.2  09/16/2015 1211   CREATININE 0.8 09/16/2015 1211      Component Value Date/Time   CALCIUM 9.5 09/16/2015 1211   ALKPHOS 76 09/16/2015 1211   AST 18 09/16/2015 1211   ALT 23 09/16/2015 1211   BILITOT 0.52 09/16/2015 1211       No results found for: LABCA2  No components found for: LABCA125  No results for input(s): INR in the last 168 hours.  Urinalysis No results found for: COLORURINE, APPEARANCEUR, LABSPEC, PHURINE, GLUCOSEU, HGBUR, BILIRUBINUR, KETONESUR, PROTEINUR, UROBILINOGEN, NITRITE, LEUKOCYTESUR  ELIGIBLE FOR AVAILABLE RESEARCH PROTOCOL: Alliance, PREVENT  STUDIES: Mr Breast Bilateral W Wo Contrast  09/03/2015  CLINICAL DATA:  Ultrasound-guided core biopsy of mass in the right breast 6 o'clock location showed invasive ductal carcinoma (clip placed). Ultrasound-guided core biopsy of an enlarged right axillary lymph node demonstrated mammary carcinoma. Stereotactic guided core biopsy of calcifications in the 5 o'clock location of the right breast demonstrated high-grade ductal carcinoma in situ. The clip did not deploy a following stereotactic guided biopsy. LABS:  None EXAM: BILATERAL BREAST MRI WITH AND WITHOUT CONTRAST TECHNIQUE: Multiplanar, multisequence MR images of both breasts were obtained prior to and following the intravenous administration of 15 ml of MultiHance. THREE-DIMENSIONAL MR IMAGE RENDERING ON INDEPENDENT WORKSTATION: Three-dimensional MR images were rendered by post-processing of the original MR data on an independent workstation. The three-dimensional MR images were interpreted, and findings are reported in the following complete MRI report for this study. Three dimensional images were evaluated at the independent DynaCad workstation COMPARISON:  Exams from [facility] 08/30/2015 and earlier FINDINGS: Breast composition: d. Extreme fibroglandular tissue. Background parenchymal enhancement: Minimal Right breast: Within the lower central portion of the  right breast there is an enhancing mass. Margins are spiculated. Homogeneous internal enhancement shows rapid wash-in and washout kinetics. Within this mass there is tissue marker clip artifact. There is no appreciable enhancement anterior to the mass to correlate with the calcifications known to represent DCIS on stereotactic guided core biopsy. No other suspicious enhancement in the right breast. Left breast: No mass or abnormal enhancement. Lymph nodes: Enlarged right axillary lymph node, biopsy proven to be metastatic disease. Ancillary findings:  None. IMPRESSION: 1. Known mass in the 6 o'clock location of the right breast, correlating well with recently diagnosed invasive ductal carcinoma. 2. No appreciable enhancement anterior to the mass to correlate with the calcifications and DCIS. 3. Enlarged right axillary lymph node, consistent with metastatic disease. RECOMMENDATION: Treatment plan for new right breast in situ and  invasive ductal carcinoma and metastatic disease in the right axilla. If breast conservation is being considered, recommend localization of the calcifications and mass in the lower central portion of the right breast. BI-RADS CATEGORY  6: Known biopsy-proven malignancy. Electronically Signed   By: Nolon Nations M.D.   On: 09/03/2015 16:50   Dg Chest Port 1 View  09/09/2015  CLINICAL DATA:  Porta catheter insertion EXAM: PORTABLE CHEST 1 VIEW COMPARISON:  None available FINDINGS: Right-sided porta catheter with tip at the upper SVC level. There is no pneumothorax or mediastinal widening. Normal heart size and mediastinal contours. IMPRESSION: No adverse finding after porta catheter insertion. The tip is at the upper SVC. Electronically Signed   By: Monte Fantasia M.D.   On: 09/09/2015 13:15   Dg Fluoro Guide Cv Line-no Report  09/09/2015  CLINICAL DATA:  FLOURO GUIDE CV LINE Fluoroscopy was utilized by the requesting physician.  No radiographic interpretation.     ASSESSMENT: 55  y.o. [place] woman status post right breast and axillary lymph node biopsy 08/30/2015, both positive for a clinical T1-2 N1, stage II invasive ductal carcinoma, grade 3, estrogen and progesterone receptor positive, HER-2/neu nonamplified, with an MIB-1 of 80%  (1) neoadjuvant chemotherapy started 09/16/2015 consisting of doxorubicin and cyclophosphamide in dose dense fashion 4, to be followed by paclitaxel weekly 12  (2) definitive surgery to follow with consideration of the Alliance trial  (3) adjuvant radiation to follow surgery  (4) adjuvant antiestrogen to begin at the completion of local therapy  PLAN: Michele Alvarez did terrific with her first cycle of chemotherapy and specifically she had no nausea or vomiting problems. She also had pretty good energy until day 4, but on day 5 she was able to get back to work.  The one thing I think we can make better is the reflux problem. That is what I think was the cause of the discomfort and pain she experienced in the evening of day 1. Likely this is due to the steroids. I am adding omeprazole 40 mg for her to take at bedtime every night. I think this will likely take care of it.  She already has significant taste alteration. Its fine to stay away from sweets and thinks that she doesn't like eating, and it is okay for her to go more for this salty things. She does have to watch her weight. We would like her weight to be stable if at all possible.  Today we also discussed infection prevention. Of course the most important thing is for her to keep her hands washed. With any activity she will watch her hands afterwards. If she needs it or greets anyone she will watch her hands after work. She is not getting to be changing the Liver. She is aware of these normal precautions and hopefully we can avoid an intercurrent fever without having to use prophylactic antibiotics. She does know that if she develops a temperature above 100 she needs to call us  immediately.  Otherwise she will return in a week for her second cycle of chemotherapy. She knows to call for any other issues that may develop before then.  Chauncey Cruel, MD   09/23/2015 12:36 PM

## 2015-09-30 ENCOUNTER — Ambulatory Visit (HOSPITAL_BASED_OUTPATIENT_CLINIC_OR_DEPARTMENT_OTHER): Payer: 59 | Admitting: Nurse Practitioner

## 2015-09-30 ENCOUNTER — Ambulatory Visit (HOSPITAL_BASED_OUTPATIENT_CLINIC_OR_DEPARTMENT_OTHER): Payer: 59

## 2015-09-30 ENCOUNTER — Encounter: Payer: Self-pay | Admitting: Nurse Practitioner

## 2015-09-30 ENCOUNTER — Other Ambulatory Visit (HOSPITAL_BASED_OUTPATIENT_CLINIC_OR_DEPARTMENT_OTHER): Payer: 59

## 2015-09-30 ENCOUNTER — Other Ambulatory Visit: Payer: Self-pay | Admitting: Hematology and Oncology

## 2015-09-30 ENCOUNTER — Encounter: Payer: Self-pay | Admitting: *Deleted

## 2015-09-30 VITALS — BP 102/71 | HR 72 | Temp 97.8°F | Resp 18 | Ht 68.0 in | Wt 164.7 lb

## 2015-09-30 DIAGNOSIS — Z5111 Encounter for antineoplastic chemotherapy: Secondary | ICD-10-CM | POA: Diagnosis not present

## 2015-09-30 DIAGNOSIS — C773 Secondary and unspecified malignant neoplasm of axilla and upper limb lymph nodes: Secondary | ICD-10-CM | POA: Diagnosis not present

## 2015-09-30 DIAGNOSIS — C50511 Malignant neoplasm of lower-outer quadrant of right female breast: Secondary | ICD-10-CM

## 2015-09-30 DIAGNOSIS — C50811 Malignant neoplasm of overlapping sites of right female breast: Secondary | ICD-10-CM

## 2015-09-30 LAB — COMPREHENSIVE METABOLIC PANEL
ALT: 35 U/L (ref 0–55)
ANION GAP: 7 meq/L (ref 3–11)
AST: 23 U/L (ref 5–34)
Albumin: 3.9 g/dL (ref 3.5–5.0)
Alkaline Phosphatase: 82 U/L (ref 40–150)
BUN: 15.5 mg/dL (ref 7.0–26.0)
CALCIUM: 9.7 mg/dL (ref 8.4–10.4)
CHLORIDE: 107 meq/L (ref 98–109)
CO2: 31 mEq/L — ABNORMAL HIGH (ref 22–29)
CREATININE: 0.9 mg/dL (ref 0.6–1.1)
EGFR: 75 mL/min/{1.73_m2} — ABNORMAL LOW (ref >90)
Glucose: 91 mg/dl (ref 70–140)
Potassium: 4.5 mEq/L (ref 3.5–5.1)
Sodium: 145 mEq/L (ref 136–145)
Total Bilirubin: <0.3 mg/dL (ref 0.20–1.20)
Total Protein: 6.6 g/dL (ref 6.4–8.3)

## 2015-09-30 LAB — CBC WITH DIFFERENTIAL/PLATELET
BASO%: 0.3 % (ref 0.0–2.0)
BASOS ABS: 0 10*3/uL (ref 0.0–0.1)
EOS ABS: 0 10*3/uL (ref 0.0–0.5)
EOS%: 0.1 % (ref 0.0–7.0)
HEMATOCRIT: 37.5 % (ref 34.8–46.6)
HGB: 12.1 g/dL (ref 11.6–15.9)
LYMPH#: 1.4 10*3/uL (ref 0.9–3.3)
LYMPH%: 27.8 % (ref 14.0–49.7)
MCH: 30.4 pg (ref 25.1–34.0)
MCHC: 32.4 g/dL (ref 31.5–36.0)
MCV: 93.8 fL (ref 79.5–101.0)
MONO#: 0.4 10*3/uL (ref 0.1–0.9)
MONO%: 8.7 % (ref 0.0–14.0)
NEUT#: 3.2 10*3/uL (ref 1.5–6.5)
NEUT%: 63.1 % (ref 38.4–76.8)
PLATELETS: 213 10*3/uL (ref 145–400)
RBC: 4 10*6/uL (ref 3.70–5.45)
RDW: 12.9 % (ref 11.2–14.5)
WBC: 5.1 10*3/uL (ref 3.9–10.3)

## 2015-09-30 MED ORDER — SODIUM CHLORIDE 0.9% FLUSH
10.0000 mL | INTRAVENOUS | Status: DC | PRN
  Administered 2015-09-30: 10 mL
  Filled 2015-09-30: qty 10

## 2015-09-30 MED ORDER — SODIUM CHLORIDE 0.9 % IV SOLN
600.0000 mg/m2 | Freq: Once | INTRAVENOUS | Status: AC
  Administered 2015-09-30: 1140 mg via INTRAVENOUS
  Filled 2015-09-30: qty 57

## 2015-09-30 MED ORDER — PEGFILGRASTIM 6 MG/0.6ML ~~LOC~~ PSKT
6.0000 mg | PREFILLED_SYRINGE | Freq: Once | SUBCUTANEOUS | Status: AC
  Administered 2015-09-30: 6 mg via SUBCUTANEOUS
  Filled 2015-09-30: qty 0.6

## 2015-09-30 MED ORDER — SODIUM CHLORIDE 0.9 % IV SOLN
Freq: Once | INTRAVENOUS | Status: AC
  Administered 2015-09-30: 13:00:00 via INTRAVENOUS
  Filled 2015-09-30: qty 5

## 2015-09-30 MED ORDER — HEPARIN SOD (PORK) LOCK FLUSH 100 UNIT/ML IV SOLN
500.0000 [IU] | Freq: Once | INTRAVENOUS | Status: AC | PRN
  Administered 2015-09-30: 500 [IU]
  Filled 2015-09-30: qty 5

## 2015-09-30 MED ORDER — PALONOSETRON HCL INJECTION 0.25 MG/5ML
0.2500 mg | Freq: Once | INTRAVENOUS | Status: AC
  Administered 2015-09-30: 0.25 mg via INTRAVENOUS

## 2015-09-30 MED ORDER — PALONOSETRON HCL INJECTION 0.25 MG/5ML
INTRAVENOUS | Status: AC
  Filled 2015-09-30: qty 5

## 2015-09-30 MED ORDER — SODIUM CHLORIDE 0.9 % IV SOLN
Freq: Once | INTRAVENOUS | Status: AC
  Administered 2015-09-30: 12:00:00 via INTRAVENOUS

## 2015-09-30 MED ORDER — DOXORUBICIN HCL CHEMO IV INJECTION 2 MG/ML
60.0000 mg/m2 | Freq: Once | INTRAVENOUS | Status: AC
  Administered 2015-09-30: 114 mg via INTRAVENOUS
  Filled 2015-09-30: qty 57

## 2015-09-30 NOTE — Patient Instructions (Signed)
[facility] Discharge Instructions for Patients Receiving Chemotherapy  Today you received the following chemotherapy agents: Adriamycin and Cytoxan.  To help prevent nausea and vomiting after your treatment, we encourage you to take your nausea medication as prescribed.   If you develop nausea and vomiting that is not controlled by your nausea medication, call the clinic.   BELOW ARE SYMPTOMS THAT SHOULD BE REPORTED IMMEDIATELY:  *FEVER GREATER THAN 100.5 F  *CHILLS WITH OR WITHOUT FEVER  NAUSEA AND VOMITING THAT IS NOT CONTROLLED WITH YOUR NAUSEA MEDICATION  *UNUSUAL SHORTNESS OF BREATH  *UNUSUAL BRUISING OR BLEEDING  TENDERNESS IN MOUTH AND THROAT WITH OR WITHOUT PRESENCE OF ULCERS  *URINARY PROBLEMS  *BOWEL PROBLEMS  UNUSUAL RASH Items with * indicate a potential emergency and should be followed up as soon as possible.  Feel free to call the clinic you have any questions or concerns. The clinic phone number is [phone].  Please show the [place] at check-in to the Emergency Department and triage nurse.  Doxorubicin injection What is this medicine? DOXORUBICIN (dox oh ROO bi sin) is a chemotherapy drug. It is used to treat many kinds of cancer like Hodgkin's disease, leukemia, non-Hodgkin's lymphoma, neuroblastoma, sarcoma, and Wilms' tumor. It is also used to treat bladder cancer, breast cancer, lung cancer, ovarian cancer, stomach cancer, and thyroid cancer. This medicine may be used for other purposes; ask your health care provider or pharmacist if you have questions. What should I tell my health care provider before I take this medicine? They need to know if you have any of these conditions: -blood disorders -heart disease, recent heart attack -infection (especially a virus infection such as chickenpox, cold sores, or herpes) -irregular heartbeat -liver disease -recent or ongoing radiation therapy -an unusual or allergic reaction to  doxorubicin, other chemotherapy agents, other medicines, foods, dyes, or preservatives -pregnant or trying to get pregnant -breast-feeding How should I use this medicine? This drug is given as an infusion into a vein. It is administered in a hospital or clinic by a specially trained health care professional. If you have pain, swelling, burning or any unusual feeling around the site of your injection, tell your health care professional right away. Talk to your pediatrician regarding the use of this medicine in children. Special care may be needed. Overdosage: If you think you have taken too much of this medicine contact a poison control center or emergency room at once. NOTE: This medicine is only for you. Do not share this medicine with others. What if I miss a dose? It is important not to miss your dose. Call your doctor or health care professional if you are unable to keep an appointment. What may interact with this medicine? Do not take this medicine with any of the following medications: -cisapride -droperidol -halofantrine -pimozide -zidovudine This medicine may also interact with the following medications: -chloroquine -chlorpromazine -clarithromycin -cyclophosphamide -cyclosporine -erythromycin -medicines for depression, anxiety, or psychotic disturbances -medicines for irregular heart beat like amiodarone, bepridil, dofetilide, encainide, flecainide, propafenone, quinidine -medicines for seizures like ethotoin, fosphenytoin, phenytoin -medicines for nausea, vomiting like dolasetron, ondansetron, palonosetron -medicines to increase blood counts like filgrastim, pegfilgrastim, sargramostim -methadone -methotrexate -pentamidine -progesterone -vaccines -verapamil Talk to your doctor or health care professional before taking any of these medicines: -acetaminophen -aspirin -ibuprofen -ketoprofen -naproxen This list may not describe all possible interactions. Give your  health care provider a list of all the medicines, herbs, non-prescription drugs, or dietary supplements you use. Also tell  them if you smoke, drink alcohol, or use illegal drugs. Some items may interact with your medicine. What should I watch for while using this medicine? Your condition will be monitored carefully while you are receiving this medicine. You will need important blood work done while you are taking this medicine. This drug may make you feel generally unwell. This is not uncommon, as chemotherapy can affect healthy cells as well as cancer cells. Report any side effects. Continue your course of treatment even though you feel ill unless your doctor tells you to stop. Your urine may turn red for a few days after your dose. This is not blood. If your urine is dark or brown, call your doctor. In some cases, you may be given additional medicines to help with side effects. Follow all directions for their use. Call your doctor or health care professional for advice if you get a fever, chills or sore throat, or other symptoms of a cold or flu. Do not treat yourself. This drug decreases your body's ability to fight infections. Try to avoid being around people who are sick. This medicine may increase your risk to bruise or bleed. Call your doctor or health care professional if you notice any unusual bleeding. Be careful brushing and flossing your teeth or using a toothpick because you may get an infection or bleed more easily. If you have any dental work done, tell your dentist you are receiving this medicine. Avoid taking products that contain aspirin, acetaminophen, ibuprofen, naproxen, or ketoprofen unless instructed by your doctor. These medicines may hide a fever. Men and women of childbearing age should use effective birth control methods while using taking this medicine. Do not become pregnant while taking this medicine. There is a potential for serious side effects to an unborn child. Talk to  your health care professional or pharmacist for more information. Do not breast-feed an infant while taking this medicine. Do not let others touch your urine or other body fluids for 5 days after each treatment with this medicine. Caregivers should wear latex gloves to avoid touching body fluids during this time. There is a maximum amount of this medicine you should receive throughout your life. The amount depends on the medical condition being treated and your overall health. Your doctor will watch how much of this medicine you receive in your lifetime. Tell your doctor if you have taken this medicine before. What side effects may I notice from receiving this medicine? Side effects that you should report to your doctor or health care professional as soon as possible: -allergic reactions like skin rash, itching or hives, swelling of the face, lips, or tongue -low blood counts - this medicine may decrease the number of white blood cells, red blood cells and platelets. You may be at increased risk for infections and bleeding. -signs of infection - fever or chills, cough, sore throat, pain or difficulty passing urine -signs of decreased platelets or bleeding - bruising, pinpoint red spots on the skin, black, tarry stools, blood in the urine -signs of decreased red blood cells - unusually weak or tired, fainting spells, lightheadedness -breathing problems -chest pain -fast, irregular heartbeat -mouth sores -nausea, vomiting -pain, swelling, redness at site where injected -pain, tingling, numbness in the hands or feet -swelling of ankles, feet, or hands -unusual bleeding or bruising Side effects that usually do not require medical attention (report to your doctor or health care professional if they continue or are bothersome): -diarrhea -facial flushing -hair loss -loss of appetite -  missed menstrual periods -nail discoloration or damage -red or watery eyes -red colored urine -stomach  upset This list may not describe all possible side effects. Call your doctor for medical advice about side effects. You may report side effects to FDA at 1-800-FDA-1088. Where should I keep my medicine? This drug is given in a hospital or clinic and will not be stored at home. NOTE: This sheet is a summary. It may not cover all possible information. If you have questions about this medicine, talk to your doctor, pharmacist, or health care provider.    2016, [employer]. (2012-10-29 09:54:34)   Cyclophosphamide injection What is this medicine? CYCLOPHOSPHAMIDE (sye kloe FOSS fa mide) is a chemotherapy drug. It slows the growth of cancer cells. This medicine is used to treat many types of cancer like lymphoma, myeloma, leukemia, breast cancer, and ovarian cancer, to name a few. This medicine may be used for other purposes; ask your health care provider or pharmacist if you have questions. What should I tell my health care provider before I take this medicine? They need to know if you have any of these conditions: -blood disorders -history of other chemotherapy -infection -kidney disease -liver disease -recent or ongoing radiation therapy -tumors in the bone marrow -an unusual or allergic reaction to cyclophosphamide, other chemotherapy, other medicines, foods, dyes, or preservatives -pregnant or trying to get pregnant -breast-feeding How should I use this medicine? This drug is usually given as an injection into a vein or muscle or by infusion into a vein. It is administered in a hospital or clinic by a specially trained health care professional. Talk to your pediatrician regarding the use of this medicine in children. Special care may be needed. Overdosage: If you think you have taken too much of this medicine contact a poison control center or emergency room at once. NOTE: This medicine is only for you. Do not share this medicine with others. What if I miss a dose? It is  important not to miss your dose. Call your doctor or health care professional if you are unable to keep an appointment. What may interact with this medicine? This medicine may interact with the following medications: -amiodarone -amphotericin B -azathioprine -certain antiviral medicines for HIV or AIDS such as protease inhibitors (e.g., indinavir, ritonavir) and zidovudine -certain blood pressure medications such as benazepril, captopril, enalapril, fosinopril, lisinopril, moexipril, monopril, perindopril, quinapril, ramipril, trandolapril -certain cancer medications such as anthracyclines (e.g., daunorubicin, doxorubicin), busulfan, cytarabine, paclitaxel, pentostatin, tamoxifen, trastuzumab -certain diuretics such as chlorothiazide, chlorthalidone, hydrochlorothiazide, indapamide, metolazone -certain medicines that treat or prevent blood clots like warfarin -certain muscle relaxants such as succinylcholine -cyclosporine -etanercept -indomethacin -medicines to increase blood counts like filgrastim, pegfilgrastim, sargramostim -medicines used as general anesthesia -metronidazole -natalizumab This list may not describe all possible interactions. Give your health care provider a list of all the medicines, herbs, non-prescription drugs, or dietary supplements you use. Also tell them if you smoke, drink alcohol, or use illegal drugs. Some items may interact with your medicine. What should I watch for while using this medicine? Visit your doctor for checks on your progress. This drug may make you feel generally unwell. This is not uncommon, as chemotherapy can affect healthy cells as well as cancer cells. Report any side effects. Continue your course of treatment even though you feel ill unless your doctor tells you to stop. Drink water or other fluids as directed. Urinate often, even at night. In some cases, you may be given additional medicines to help  with side effects. Follow all directions for  their use. Call your doctor or health care professional for advice if you get a fever, chills or sore throat, or other symptoms of a cold or flu. Do not treat yourself. This drug decreases your body's ability to fight infections. Try to avoid being around people who are sick. This medicine may increase your risk to bruise or bleed. Call your doctor or health care professional if you notice any unusual bleeding. Be careful brushing and flossing your teeth or using a toothpick because you may get an infection or bleed more easily. If you have any dental work done, tell your dentist you are receiving this medicine. You may get drowsy or dizzy. Do not drive, use machinery, or do anything that needs mental alertness until you know how this medicine affects you. Do not become pregnant while taking this medicine or for 1 year after stopping it. Women should inform their doctor if they wish to become pregnant or think they might be pregnant. Men should not father a child while taking this medicine and for 4 months after stopping it. There is a potential for serious side effects to an unborn child. Talk to your health care professional or pharmacist for more information. Do not breast-feed an infant while taking this medicine. This medicine may interfere with the ability to have a child. This medicine has caused ovarian failure in some women. This medicine has caused reduced sperm counts in some men. You should talk with your doctor or health care professional if you are concerned about your fertility. If you are going to have surgery, tell your doctor or health care professional that you have taken this medicine. What side effects may I notice from receiving this medicine? Side effects that you should report to your doctor or health care professional as soon as possible: -allergic reactions like skin rash, itching or hives, swelling of the face, lips, or tongue -low blood counts - this medicine may decrease the  number of white blood cells, red blood cells and platelets. You may be at increased risk for infections and bleeding. -signs of infection - fever or chills, cough, sore throat, pain or difficulty passing urine -signs of decreased platelets or bleeding - bruising, pinpoint red spots on the skin, black, tarry stools, blood in the urine -signs of decreased red blood cells - unusually weak or tired, fainting spells, lightheadedness -breathing problems -dark urine -dizziness -palpitations -swelling of the ankles, feet, hands -trouble passing urine or change in the amount of urine -weight gain -yellowing of the eyes or skin Side effects that usually do not require medical attention (report to your doctor or health care professional if they continue or are bothersome): -changes in nail or skin color -hair loss -missed menstrual periods -mouth sores -nausea, vomiting This list may not describe all possible side effects. Call your doctor for medical advice about side effects. You may report side effects to FDA at 1-800-FDA-1088. Where should I keep my medicine? This drug is given in a hospital or clinic and will not be stored at home. NOTE: This sheet is a summary. It may not cover all possible information. If you have questions about this medicine, talk to your doctor, pharmacist, or health care provider.    2016, [employer]. (2012-05-17 16:22:58)

## 2015-09-30 NOTE — Progress Notes (Signed)
[place]  Telephone:(336) [phone] Fax:(336) [phone]     ID: [name] DOB: [birth year]  MR#: [id number]  JGG#:[id number]  Patient Care Team: Jonathon Jordan, MD as PCP - General (Family Medicine) Avon Gully, NP as Nurse Practitioner (Obstetrics and Gynecology) [name], MD as Consulting Physician (General Surgery) Chauncey Cruel, MD as Consulting Physician (Oncology) Eppie Gibson, MD as Attending Physician (Radiation Oncology) Sylvan Cheese, NP as Nurse Practitioner (Hematology and Oncology) Jovita Gamma, MD as Consulting Physician (Neurosurgery) PCP: Lilian Coma, MD OTHER MD:  CHIEF COMPLAINT: Node-positive breast cancer  CURRENT TREATMENT: Neoadjuvant chemotherapy   BREAST CANCER HISTORY: From the original intake note:  "Michele Alvarez" had routine screening mammography at [facility] 08/07/2015. There was an area of calcification in the right breast 2:00 position. There was also an area of calcification in the left breast 7:00 position. She was recalled for bilateral diagnostic mammography 08/12/2015. The left breast calcifications were felt to be likely benign and repeat mammography in 6 months was suggested.  On the right, breast density was category D. There was a 1.2 cm irregular mass at the 6:00, inframammary position and also suspicious calcifications. Adding those together the area measured 2.5 cm. Ultrasound of the right breast on the same day found a 1.3 cm lobulated mass in the right breast at the 6:00 position. There was a 1.7 cm axillary lymph node with a fatty hilum.   On 08/30/2015 the patient underwent biopsy of the breast mass, the abnormal lymph node, and the area of calcifications. The calcifications ([id number]) were associated with ductal carcinoma in situ, with the prognostic panel pending. 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 The lymph node also was positive.  Note that the clip to the patient's breast mass did not deploy.  The patient's subsequent history is as detailed below  INTERVAL HISTORY: Michele Alvarez returns for follow up of her estrogen receptor positive breast cancer, accompanied by her partner, [name]. Today is day 1 cycle 2 of 4 planned cycles of doxorubicin and cyclophosphamide, with neulasta for granulocyte support.  REVIEW OF SYSTEMS: Michele Alvarez has regained her energy and feels back to baseline. She continues to work with good endurance. Her mental cloudiness has improved. She denies fevers, chills, nausea, or vomiting. She is avoiding constipation with miralax and her appetite is good despite taste changes. She denies mouth sores or rashes. Her heartburn resolved with the addition of prilosec last week. She is sleeping well and does not want to use ativan any more. A detailed review of systems is otherwise stable.  PAST MEDICAL HISTORY: Past Medical History  Diagnosis Date  . Breast cancer of lower-outer quadrant of right female breast ([place]) 08/19/2015  . Hot flashes   . PONV (postoperative nausea and vomiting)     PAST SURGICAL HISTORY: Past Surgical History  Procedure Laterality Date  . Neck surgery  09/01/1999  . Back surgery      x2, lumbar  . Portacath placement Right 09/09/2015    Procedure: INSERTION PORT-A-CATH WITH [place] ;  Surgeon: [name], MD;  Location: [facility];  Service: General;  Laterality: Right;    FAMILY HISTORY No family history on file. The patient's father died in 08/31/14 at the age of 69. He had significant COPD. The patient's mother died at the age of 61 from Parkinson's disease. The patient has 1 brother, 1 sister. There is no  cancer history in the family to her knowledge.  GYNECOLOGIC HISTORY:  No LMP recorded. Patient is postmenopausal. Menarche age 10. She has GI asked P0. She stopped having periods in January 2014. She never  took hormone replacement or oral contraceptives.  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 She lives with her partner, [name], and 2 cats.     ADVANCED DIRECTIVES: Not in place. At the 09/01/2015 clinic visit the patient was given the appropriate forms to complete and notarize at her discretion.   HEALTH MAINTENANCE: Social History  Substance Use Topics  . Smoking status: Never Smoker   . Smokeless tobacco: Not on file  . Alcohol Use: Yes     Comment: social     Colonoscopy: Never   PAP:  Bone density: Never  Lipid panel:  Allergies  Allergen Reactions  . Latex Other (See Comments)    Fever blisters  . Acyclovir And Related   . Skin Adhesives Dermatitis    Reaction to dermabond, do not use    Current Outpatient Prescriptions  Medication Sig Dispense Refill  . dexamethasone (DECADRON) 4 MG tablet Take 2 tablets by mouth once a day on the day after chemotherapy and then take 2 tablets two times a day for 2 days. Take with food. 30 tablet 1  . lidocaine-prilocaine (EMLA) cream Apply to affected area once 30 g 3  . LORazepam (ATIVAN) 0.5 MG tablet Take 1 tablet (0.5 mg total) by mouth every 6 (six) hours as needed (Nausea or vomiting). 30 tablet 0  . omeprazole (PRILOSEC) 40 MG capsule Take 1 capsule (40 mg total) by mouth daily. 60 capsule 3  . ondansetron (ZOFRAN) 8 MG tablet Take 1 tablet (8 mg total) by mouth 2 (two) times daily as needed. Start on the third day after chemotherapy. 30 tablet 1  . prochlorperazine (COMPAZINE) 10 MG tablet Take 1 tablet (10 mg total) by mouth every 6 (six) hours as needed (Nausea or vomiting). (Patient not taking: Reported on 09/30/2015) 30 tablet 1   No current facility-administered medications for this visit.    OBJECTIVE: Middle-aged white woman In no acute distress Filed Vitals:   09/30/15 1042  BP: 102/71  Pulse: 72  Temp: 97.8 F (36.6 C)  Resp: 18     Body mass index is 25.05 kg/(m^2).    ECOG FS:1 -  Symptomatic but completely ambulatory  Sclerae unicteric, pupils round and equal Oropharynx clear and moist-- no thrush or other lesions No cervical or supraclavicular adenopathy Lungs no rales or rhonchi Heart regular rate and rhythm Abd soft, nontender, positive bowel sounds MSK no focal spinal tenderness, no upper extremity lymphedema Neuro: nonfocal, well oriented, appropriate affect Breasts: The right breast ecchymosis previously noted has almost completely resolved. The right axilla is benign. The left breast is unremarkable.   LAB RESULTS:  CMP     Component Value Date/Time   NA 145 09/30/2015 1022   K 4.5 09/30/2015 1022   CO2 31* 09/30/2015 1022   GLUCOSE 91 09/30/2015 1022   BUN 15.5 09/30/2015 1022   CREATININE 0.9 09/30/2015 1022   CALCIUM 9.7 09/30/2015 1022   PROT 6.6 09/30/2015 1022   ALBUMIN 3.9 09/30/2015 1022   AST 23 09/30/2015 1022   ALT 35 09/30/2015 1022   ALKPHOS 82 09/30/2015 1022   BILITOT <0.30 09/30/2015 1022    INo results found for: SPEP, UPEP  Lab Results  Component Value Date   WBC 5.1 09/30/2015   NEUTROABS 3.2 09/30/2015  HGB 12.1 09/30/2015   HCT 37.5 09/30/2015   MCV 93.8 09/30/2015   PLT 213 09/30/2015      Chemistry      Component Value Date/Time   NA 145 09/30/2015 1022   K 4.5 09/30/2015 1022   CO2 31* 09/30/2015 1022   BUN 15.5 09/30/2015 1022   CREATININE 0.9 09/30/2015 1022      Component Value Date/Time   CALCIUM 9.7 09/30/2015 1022   ALKPHOS 82 09/30/2015 1022   AST 23 09/30/2015 1022   ALT 35 09/30/2015 1022   BILITOT <0.30 09/30/2015 1022       No results found for: LABCA2  No components found for: LABCA125  No results for input(s): INR in the last 168 hours.  Urinalysis No results found for: COLORURINE, APPEARANCEUR, LABSPEC, PHURINE, GLUCOSEU, HGBUR, BILIRUBINUR, KETONESUR, PROTEINUR, UROBILINOGEN, NITRITE, LEUKOCYTESUR  ELIGIBLE FOR AVAILABLE RESEARCH PROTOCOL: Alliance, PREVENT  STUDIES: Mr  Breast Bilateral W Wo Contrast  09/03/2015  CLINICAL DATA:  Ultrasound-guided core biopsy of mass in the right breast 6 o'clock location showed invasive ductal carcinoma (clip placed). Ultrasound-guided core biopsy of an enlarged right axillary lymph node demonstrated mammary carcinoma. Stereotactic guided core biopsy of calcifications in the 5 o'clock location of the right breast demonstrated high-grade ductal carcinoma in situ. The clip did not deploy a following stereotactic guided biopsy. LABS:  None EXAM: BILATERAL BREAST MRI WITH AND WITHOUT CONTRAST TECHNIQUE: Multiplanar, multisequence MR images of both breasts were obtained prior to and following the intravenous administration of 15 ml of MultiHance. THREE-DIMENSIONAL MR IMAGE RENDERING ON INDEPENDENT WORKSTATION: Three-dimensional MR images were rendered by post-processing of the original MR data on an independent workstation. The three-dimensional MR images were interpreted, and findings are reported in the following complete MRI report for this study. Three dimensional images were evaluated at the independent DynaCad workstation COMPARISON:  Exams from [facility] 08/30/2015 and earlier FINDINGS: Breast composition: d. Extreme fibroglandular tissue. Background parenchymal enhancement: Minimal Right breast: Within the lower central portion of the right breast there is an enhancing mass. Margins are spiculated. Homogeneous internal enhancement shows rapid wash-in and washout kinetics. Within this mass there is tissue marker clip artifact. There is no appreciable enhancement anterior to the mass to correlate with the calcifications known to represent DCIS on stereotactic guided core biopsy. No other suspicious enhancement in the right breast. Left breast: No mass or abnormal enhancement. Lymph nodes: Enlarged right axillary lymph node, biopsy proven to be metastatic disease. Ancillary findings:  None. IMPRESSION: 1. Known mass in the 6 o'clock  location of the right breast, correlating well with recently diagnosed invasive ductal carcinoma. 2. No appreciable enhancement anterior to the mass to correlate with the calcifications and DCIS. 3. Enlarged right axillary lymph node, consistent with metastatic disease. RECOMMENDATION: Treatment plan for new right breast in situ and invasive ductal carcinoma and metastatic disease in the right axilla. If breast conservation is being considered, recommend localization of the calcifications and mass in the lower central portion of the right breast. BI-RADS CATEGORY  6: Known biopsy-proven malignancy. Electronically Signed   By: Nolon Nations M.D.   On: 09/03/2015 16:50   Dg Chest Port 1 View  09/09/2015  CLINICAL DATA:  Porta catheter insertion EXAM: PORTABLE CHEST 1 VIEW COMPARISON:  None available FINDINGS: Right-sided porta catheter with tip at the upper SVC level. There is no pneumothorax or mediastinal widening. Normal heart size and mediastinal contours. IMPRESSION: No adverse finding after porta catheter insertion. The tip is at  the upper SVC. Electronically Signed   By: Monte Fantasia M.D.   On: 09/09/2015 13:15   Dg Fluoro Guide Cv Line-no Report  09/09/2015  CLINICAL DATA:  FLOURO GUIDE CV LINE Fluoroscopy was utilized by the requesting physician.  No radiographic interpretation.     ASSESSMENT: 56 y.o. [place] woman status post right breast and axillary lymph node biopsy 08/30/2015, both positive for a clinical T1-2 N1, stage II invasive ductal carcinoma, grade 3, estrogen and progesterone receptor positive, HER-2/neu nonamplified, with an MIB-1 of 80%  (1) neoadjuvant chemotherapy started 09/16/2015 consisting of doxorubicin and cyclophosphamide in dose dense fashion 4, to be followed by paclitaxel weekly 12  (2) definitive surgery to follow with consideration of the Alliance trial  (3) adjuvant radiation to follow surgery  (4) adjuvant antiestrogen to begin at the completion of  local therapy  PLAN: Michele Alvarez has recovered nicely. The labs were reviewed in detail her Duncan has rebounded to 3.2, and her platelets are back to normal at 213. She will proceed with cycle 2 of doxorubicin and cyclophosphamide as planned today.   She is going to avoid use of ativan this week. It just made her "head swim" and she believes she will sleep well without it.   Michele Alvarez will return in 1 week for labs and a nadir visit. She understands and agrees with this plan. She knows the goal of treatment in her case is cure. She has been encouraged to call with any issues that might arise before her next visit here.  Laurie Panda, NP   09/30/2015 11:29 AM

## 2015-10-04 ENCOUNTER — Telehealth: Payer: Self-pay | Admitting: *Deleted

## 2015-10-04 NOTE — Telephone Encounter (Signed)
Received call from patient stating she does not have an appointment for this week to check labs and see Heather.  Appts. Made for 3/23 and patient aware.

## 2015-10-07 ENCOUNTER — Encounter: Payer: Self-pay | Admitting: Nurse Practitioner

## 2015-10-07 ENCOUNTER — Other Ambulatory Visit (HOSPITAL_BASED_OUTPATIENT_CLINIC_OR_DEPARTMENT_OTHER): Payer: 59

## 2015-10-07 ENCOUNTER — Ambulatory Visit (HOSPITAL_BASED_OUTPATIENT_CLINIC_OR_DEPARTMENT_OTHER): Payer: 59 | Admitting: Nurse Practitioner

## 2015-10-07 VITALS — BP 101/65 | HR 71 | Temp 97.4°F | Resp 18 | Wt 162.0 lb

## 2015-10-07 DIAGNOSIS — C773 Secondary and unspecified malignant neoplasm of axilla and upper limb lymph nodes: Secondary | ICD-10-CM | POA: Diagnosis not present

## 2015-10-07 DIAGNOSIS — C50511 Malignant neoplasm of lower-outer quadrant of right female breast: Secondary | ICD-10-CM

## 2015-10-07 DIAGNOSIS — C50811 Malignant neoplasm of overlapping sites of right female breast: Secondary | ICD-10-CM | POA: Diagnosis not present

## 2015-10-07 DIAGNOSIS — R439 Unspecified disturbances of smell and taste: Secondary | ICD-10-CM

## 2015-10-07 LAB — CBC WITH DIFFERENTIAL/PLATELET
BASO%: 0.3 % (ref 0.0–2.0)
Basophils Absolute: 0 10*3/uL (ref 0.0–0.1)
EOS%: 0.3 % (ref 0.0–7.0)
Eosinophils Absolute: 0 10*3/uL (ref 0.0–0.5)
HEMATOCRIT: 35.3 % (ref 34.8–46.6)
HGB: 11.5 g/dL — ABNORMAL LOW (ref 11.6–15.9)
LYMPH#: 0.7 10*3/uL — AB (ref 0.9–3.3)
LYMPH%: 26.2 % (ref 14.0–49.7)
MCH: 30.4 pg (ref 25.1–34.0)
MCHC: 32.6 g/dL (ref 31.5–36.0)
MCV: 93.1 fL (ref 79.5–101.0)
MONO#: 0.4 10*3/uL (ref 0.1–0.9)
MONO%: 15.1 % — AB (ref 0.0–14.0)
NEUT#: 1.5 10*3/uL (ref 1.5–6.5)
NEUT%: 58.1 % (ref 38.4–76.8)
Platelets: 201 10*3/uL (ref 145–400)
RBC: 3.79 10*6/uL (ref 3.70–5.45)
RDW: 13 % (ref 11.2–14.5)
WBC: 2.5 10*3/uL — ABNORMAL LOW (ref 3.9–10.3)

## 2015-10-07 LAB — COMPREHENSIVE METABOLIC PANEL
ALBUMIN: 3.7 g/dL (ref 3.5–5.0)
ALT: 27 U/L (ref 0–55)
AST: 11 U/L (ref 5–34)
Alkaline Phosphatase: 116 U/L (ref 40–150)
Anion Gap: 5 mEq/L (ref 3–11)
BUN: 13.6 mg/dL (ref 7.0–26.0)
CALCIUM: 9.3 mg/dL (ref 8.4–10.4)
CO2: 30 mEq/L — ABNORMAL HIGH (ref 22–29)
Chloride: 106 mEq/L (ref 98–109)
Creatinine: 0.8 mg/dL (ref 0.6–1.1)
EGFR: 89 mL/min/{1.73_m2} — AB (ref >90)
GLUCOSE: 107 mg/dL (ref 70–140)
Potassium: 4.6 mEq/L (ref 3.5–5.1)
SODIUM: 142 meq/L (ref 136–145)
TOTAL PROTEIN: 6.3 g/dL — AB (ref 6.4–8.3)
Total Bilirubin: 0.41 mg/dL (ref 0.20–1.20)

## 2015-10-07 NOTE — Progress Notes (Signed)
[place]  Telephone:(336) [phone] Fax:(336) [phone]     ID: Michele Alvarez DOB: [birth year]  MR#: [id number]  BWI#:[id number]  Patient Care Team: Jonathon Jordan, MD as PCP - General (Family Medicine) Avon Gully, NP as Nurse Practitioner (Obstetrics and Gynecology) [name], MD as Consulting Physician (General Surgery) Chauncey Cruel, MD as Consulting Physician (Oncology) Eppie Gibson, MD as Attending Physician (Radiation Oncology) Sylvan Cheese, NP as Nurse Practitioner (Hematology and Oncology) Jovita Gamma, MD as Consulting Physician (Neurosurgery) PCP: Lilian Coma, MD OTHER MD:  CHIEF COMPLAINT: Node-positive breast cancer  CURRENT TREATMENT: Neoadjuvant chemotherapy   BREAST CANCER HISTORY: From the original intake note:  "Michele Alvarez" had routine screening mammography at [facility] 08/07/2015. There was an area of calcification in the right breast 2:00 position. There was also an area of calcification in the left breast 7:00 position. She was recalled for bilateral diagnostic mammography 08/12/2015. The left breast calcifications were felt to be likely benign and repeat mammography in 6 months was suggested.  On the right, breast density was category D. There was a 1.2 cm irregular mass at the 6:00, inframammary position and also suspicious calcifications. Adding those together the area measured 2.5 cm. Ultrasound of the right breast on the same day found a 1.3 cm lobulated mass in the right breast at the 6:00 position. There was a 1.7 cm axillary lymph node with a fatty hilum.   On 08/30/2015 the patient underwent biopsy of the breast mass, the abnormal lymph node, and the area of calcifications. The calcifications ([id number]) were associated with ductal carcinoma in situ, with the prognostic panel pending. 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 The lymph node also was positive.  Note that the clip to the patient's breast mass did not deploy.  The patient's subsequent history is as detailed below  INTERVAL HISTORY: Michele Alvarez returns for follow up of her estrogen receptor positive breast cancer, accompanied by her partner, [name]. Today is day 8, cycle 2 of 4 planned cycles of doxorubicin and cyclophosphamide, with neulasta for granulocyte support.  REVIEW OF SYSTEMS: Michele Alvarez denies fevers or chills. Her nausea is off and on, but she has gotten away from taking her antiemetic meds on a regular basis. Her main complaint today is taste changes. This keeps her from eating as much as she would like. She is moving her bowels well but has some light bloating. She denies mouth sores or rashes. Her energy level is decent. She sleeps well at night. A detailed review of systems is otherwise stable.  PAST MEDICAL HISTORY: Past Medical History  Diagnosis Date  . Breast cancer of lower-outer quadrant of right female breast ([place]) 08/19/2015  . Hot flashes   . PONV (postoperative nausea and vomiting)     PAST SURGICAL HISTORY: Past Surgical History  Procedure Laterality Date  . Neck surgery  1999/09/18  . Back surgery      x2, lumbar  . Portacath placement Right 09/09/2015    Procedure: INSERTION PORT-A-CATH WITH [place] ;  Surgeon: [name], MD;  Location: [place];  Service: General;  Laterality: Right;    FAMILY HISTORY No family history on file. The patient's father died in 18-Sep-2014 at the age of 44. He had significant COPD. The patient's mother died at the age of 56 from Parkinson's disease. The patient has 1 brother, 1 sister. There is no cancer history  in the family to her knowledge.  GYNECOLOGIC HISTORY:  No LMP recorded. Patient is postmenopausal. Menarche age 45. She has GI asked P0. She stopped having periods in January 2014. She never took hormone replacement or oral  contraceptives.  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 She lives with her partner, [name], and 2 cats.     ADVANCED DIRECTIVES: Not in place. At the 09/01/2015 clinic visit the patient was given the appropriate forms to complete and notarize at her discretion.   HEALTH MAINTENANCE: Social History  Substance Use Topics  . Smoking status: Never Smoker   . Smokeless tobacco: Not on file  . Alcohol Use: Yes     Comment: social     Colonoscopy: Never   PAP:  Bone density: Never  Lipid panel:  Allergies  Allergen Reactions  . Latex Other (See Comments)    Fever blisters  . Acyclovir And Related   . Skin Adhesives Dermatitis    Reaction to dermabond, do not use    Current Outpatient Prescriptions  Medication Sig Dispense Refill  . dexamethasone (DECADRON) 4 MG tablet Take 2 tablets by mouth once a day on the day after chemotherapy and then take 2 tablets two times a day for 2 days. Take with food. 30 tablet 1  . lidocaine-prilocaine (EMLA) cream Apply to affected area once 30 g 3  . LORazepam (ATIVAN) 0.5 MG tablet Take 1 tablet (0.5 mg total) by mouth every 6 (six) hours as needed (Nausea or vomiting). 30 tablet 0  . omeprazole (PRILOSEC) 40 MG capsule Take 1 capsule (40 mg total) by mouth daily. 60 capsule 3  . ondansetron (ZOFRAN) 8 MG tablet Take 1 tablet (8 mg total) by mouth 2 (two) times daily as needed. Start on the third day after chemotherapy. 30 tablet 1  . prochlorperazine (COMPAZINE) 10 MG tablet Take 1 tablet (10 mg total) by mouth every 6 (six) hours as needed (Nausea or vomiting). (Patient not taking: Reported on 09/30/2015) 30 tablet 1   No current facility-administered medications for this visit.    OBJECTIVE: Middle-aged white woman In no acute distress Filed Vitals:   10/07/15 0817  BP: 101/65  Pulse: 71  Temp: 97.4 F (36.3 C)  Resp: 18     Body mass index is 24.64 kg/(m^2).    ECOG FS:1 - Symptomatic but completely  ambulatory  Skin: warm, dry  HEENT: sclerae anicteric, conjunctivae pink, oropharynx clear. No thrush or mucositis.  Lymph Nodes: No cervical or supraclavicular lymphadenopathy  Lungs: clear to auscultation bilaterally, no rales, wheezes, or rhonci  Heart: regular rate and rhythm  Abdomen: round, soft, non tender, positive bowel sounds  Musculoskeletal: No focal spinal tenderness, no peripheral edema  Neuro: non focal, well oriented, positive affect  Breasts: deferred   LAB RESULTS:  CMP     Component Value Date/Time   NA 142 10/07/2015 0804   K 4.6 10/07/2015 0804   CO2 30* 10/07/2015 0804   GLUCOSE 107 10/07/2015 0804   BUN 13.6 10/07/2015 0804   CREATININE 0.8 10/07/2015 0804   CALCIUM 9.3 10/07/2015 0804   PROT 6.3* 10/07/2015 0804   ALBUMIN 3.7 10/07/2015 0804   AST 11 10/07/2015 0804   ALT 27 10/07/2015 0804   ALKPHOS 116 10/07/2015 0804   BILITOT 0.41 10/07/2015 0804    INo results found for: SPEP, UPEP  Lab Results  Component Value Date   WBC 2.5* 10/07/2015   NEUTROABS 1.5 10/07/2015   HGB 11.5* 10/07/2015  HCT 35.3 10/07/2015   MCV 93.1 10/07/2015   PLT 201 10/07/2015      Chemistry      Component Value Date/Time   NA 142 10/07/2015 0804   K 4.6 10/07/2015 0804   CO2 30* 10/07/2015 0804   BUN 13.6 10/07/2015 0804   CREATININE 0.8 10/07/2015 0804      Component Value Date/Time   CALCIUM 9.3 10/07/2015 0804   ALKPHOS 116 10/07/2015 0804   AST 11 10/07/2015 0804   ALT 27 10/07/2015 0804   BILITOT 0.41 10/07/2015 0804       No results found for: LABCA2  No components found for: LABCA125  No results for input(s): INR in the last 168 hours.  Urinalysis No results found for: COLORURINE, APPEARANCEUR, LABSPEC, PHURINE, GLUCOSEU, HGBUR, BILIRUBINUR, KETONESUR, PROTEINUR, UROBILINOGEN, NITRITE, LEUKOCYTESUR  ELIGIBLE FOR AVAILABLE RESEARCH PROTOCOL: Alliance, PREVENT  STUDIES: Dg Chest Port 1 View  09/09/2015  CLINICAL DATA:  Porta  catheter insertion EXAM: PORTABLE CHEST 1 VIEW COMPARISON:  None available FINDINGS: Right-sided porta catheter with tip at the upper SVC level. There is no pneumothorax or mediastinal widening. Normal heart size and mediastinal contours. IMPRESSION: No adverse finding after porta catheter insertion. The tip is at the upper SVC. Electronically Signed   By: Monte Fantasia M.D.   On: 09/09/2015 13:15   Dg Fluoro Guide Cv Line-no Report  09/09/2015  CLINICAL DATA:  FLOURO GUIDE CV LINE Fluoroscopy was utilized by the requesting physician.  No radiographic interpretation.     ASSESSMENT: 55 y.o. [place] woman status post right breast and axillary lymph node biopsy 08/30/2015, both positive for a clinical T1-2 N1, stage II invasive ductal carcinoma, grade 3, estrogen and progesterone receptor positive, HER-2/neu nonamplified, with an MIB-1 of 80%  (1) neoadjuvant chemotherapy started 09/16/2015 consisting of doxorubicin and cyclophosphamide in dose dense fashion 4, to be followed by paclitaxel weekly 12  (2) definitive surgery to follow with consideration of the Alliance trial  (3) adjuvant radiation to follow surgery  (4) adjuvant antiestrogen to begin at the completion of local therapy  PLAN: Michele Alvarez survived the week with minimal complications. She will try ginger, vinegar, or lemon juice in the food this week to see if she can spark a few taste buds. She has also been advised to eat with plasticware over silverware. The labs were reviewed in detail and were stable. She did not suffer nearly as large of a drop in her [place] as compared to cycle 1. She is borderline anemic at 11.5.  Michele Alvarez will return in 1 week for cycle 3 of treatment. She understands and agrees with this plan. She knows the goal of treatment in her case is cure. She has been encouraged to call with any issues that might arise before her next visit here.  Laurie Panda, NP   10/07/2015 8:57 AM

## 2015-10-14 ENCOUNTER — Ambulatory Visit (HOSPITAL_BASED_OUTPATIENT_CLINIC_OR_DEPARTMENT_OTHER): Payer: 59

## 2015-10-14 ENCOUNTER — Telehealth: Payer: Self-pay | Admitting: Nurse Practitioner

## 2015-10-14 ENCOUNTER — Ambulatory Visit (HOSPITAL_BASED_OUTPATIENT_CLINIC_OR_DEPARTMENT_OTHER): Payer: 59 | Admitting: Nurse Practitioner

## 2015-10-14 ENCOUNTER — Encounter: Payer: Self-pay | Admitting: *Deleted

## 2015-10-14 ENCOUNTER — Other Ambulatory Visit: Payer: Self-pay | Admitting: Hematology and Oncology

## 2015-10-14 ENCOUNTER — Encounter: Payer: Self-pay | Admitting: Nurse Practitioner

## 2015-10-14 ENCOUNTER — Other Ambulatory Visit (HOSPITAL_BASED_OUTPATIENT_CLINIC_OR_DEPARTMENT_OTHER): Payer: 59

## 2015-10-14 VITALS — BP 122/69 | HR 92 | Temp 98.2°F | Resp 18 | Wt 163.2 lb

## 2015-10-14 DIAGNOSIS — C773 Secondary and unspecified malignant neoplasm of axilla and upper limb lymph nodes: Secondary | ICD-10-CM

## 2015-10-14 DIAGNOSIS — C50811 Malignant neoplasm of overlapping sites of right female breast: Secondary | ICD-10-CM

## 2015-10-14 DIAGNOSIS — Z170 Estrogen receptor positive status [ER+]: Secondary | ICD-10-CM | POA: Diagnosis not present

## 2015-10-14 DIAGNOSIS — C50511 Malignant neoplasm of lower-outer quadrant of right female breast: Secondary | ICD-10-CM

## 2015-10-14 DIAGNOSIS — Z5111 Encounter for antineoplastic chemotherapy: Secondary | ICD-10-CM

## 2015-10-14 LAB — COMPREHENSIVE METABOLIC PANEL
ALT: 22 U/L (ref 0–55)
AST: 19 U/L (ref 5–34)
Albumin: 3.9 g/dL (ref 3.5–5.0)
Alkaline Phosphatase: 88 U/L (ref 40–150)
Anion Gap: 8 mEq/L (ref 3–11)
BUN: 14.7 mg/dL (ref 7.0–26.0)
CALCIUM: 9.5 mg/dL (ref 8.4–10.4)
CHLORIDE: 109 meq/L (ref 98–109)
CO2: 28 meq/L (ref 22–29)
Creatinine: 0.8 mg/dL (ref 0.6–1.1)
EGFR: 79 mL/min/{1.73_m2} — ABNORMAL LOW (ref >90)
Glucose: 94 mg/dl (ref 70–140)
Potassium: 3.9 mEq/L (ref 3.5–5.1)
SODIUM: 146 meq/L — AB (ref 136–145)
Total Bilirubin: <0.3 mg/dL (ref 0.20–1.20)
Total Protein: 6.5 g/dL (ref 6.4–8.3)

## 2015-10-14 LAB — CBC WITH DIFFERENTIAL/PLATELET
BASO%: 1.3 % (ref 0.0–2.0)
Basophils Absolute: 0.1 10*3/uL (ref 0.0–0.1)
EOS%: 0 % (ref 0.0–7.0)
Eosinophils Absolute: 0 10*3/uL (ref 0.0–0.5)
HEMATOCRIT: 36.4 % (ref 34.8–46.6)
HGB: 11.9 g/dL (ref 11.6–15.9)
LYMPH#: 1.3 10*3/uL (ref 0.9–3.3)
LYMPH%: 23.8 % (ref 14.0–49.7)
MCH: 31.3 pg (ref 25.1–34.0)
MCHC: 32.7 g/dL (ref 31.5–36.0)
MCV: 95.8 fL (ref 79.5–101.0)
MONO#: 0.6 10*3/uL (ref 0.1–0.9)
MONO%: 10.8 % (ref 0.0–14.0)
NEUT#: 3.4 10*3/uL (ref 1.5–6.5)
NEUT%: 64.1 % (ref 38.4–76.8)
Platelets: 226 10*3/uL (ref 145–400)
RBC: 3.8 10*6/uL (ref 3.70–5.45)
RDW: 14.1 % (ref 11.2–14.5)
WBC: 5.4 10*3/uL (ref 3.9–10.3)

## 2015-10-14 MED ORDER — SODIUM CHLORIDE 0.9% FLUSH
10.0000 mL | INTRAVENOUS | Status: DC | PRN
  Administered 2015-10-14: 10 mL
  Filled 2015-10-14: qty 10

## 2015-10-14 MED ORDER — SODIUM CHLORIDE 0.9 % IV SOLN
Freq: Once | INTRAVENOUS | Status: AC
  Administered 2015-10-14: 11:00:00 via INTRAVENOUS
  Filled 2015-10-14: qty 5

## 2015-10-14 MED ORDER — PALONOSETRON HCL INJECTION 0.25 MG/5ML
0.2500 mg | Freq: Once | INTRAVENOUS | Status: AC
  Administered 2015-10-14: 0.25 mg via INTRAVENOUS

## 2015-10-14 MED ORDER — PALONOSETRON HCL INJECTION 0.25 MG/5ML
INTRAVENOUS | Status: AC
  Filled 2015-10-14: qty 5

## 2015-10-14 MED ORDER — HEPARIN SOD (PORK) LOCK FLUSH 100 UNIT/ML IV SOLN
500.0000 [IU] | Freq: Once | INTRAVENOUS | Status: AC | PRN
  Administered 2015-10-14: 500 [IU]
  Filled 2015-10-14: qty 5

## 2015-10-14 MED ORDER — SODIUM CHLORIDE 0.9 % IV SOLN
600.0000 mg/m2 | Freq: Once | INTRAVENOUS | Status: AC
  Administered 2015-10-14: 1140 mg via INTRAVENOUS
  Filled 2015-10-14: qty 57

## 2015-10-14 MED ORDER — DOXORUBICIN HCL CHEMO IV INJECTION 2 MG/ML
60.0000 mg/m2 | Freq: Once | INTRAVENOUS | Status: AC
  Administered 2015-10-14: 114 mg via INTRAVENOUS
  Filled 2015-10-14: qty 57

## 2015-10-14 MED ORDER — SODIUM CHLORIDE 0.9 % IV SOLN
Freq: Once | INTRAVENOUS | Status: AC
  Administered 2015-10-14: 10:00:00 via INTRAVENOUS

## 2015-10-14 MED ORDER — PEGFILGRASTIM 6 MG/0.6ML ~~LOC~~ PSKT
6.0000 mg | PREFILLED_SYRINGE | Freq: Once | SUBCUTANEOUS | Status: AC
  Administered 2015-10-14: 6 mg via SUBCUTANEOUS
  Filled 2015-10-14: qty 0.6

## 2015-10-14 NOTE — Progress Notes (Signed)
[place]  [phone]) [phone] [phone]) [phone]     ID: Michele Alvarez DOB: [birth year]  MR#: [id number]  TKZ#:[id number]  Patient Care Team: Jonathon Jordan, MD as PCP - General (Family Medicine) Avon Gully, NP as Nurse Practitioner (Obstetrics and Gynecology) [name], MD as Consulting Physician (General Surgery) Chauncey Cruel, MD as Consulting Physician (Oncology) Eppie Gibson, MD as Attending Physician (Radiation Oncology) Sylvan Cheese, NP as Nurse Practitioner (Hematology and Oncology) Jovita Gamma, MD as Consulting Physician (Neurosurgery) PCP: Lilian Coma, MD OTHER MD:  CHIEF COMPLAINT: Node-positive breast cancer  CURRENT TREATMENT: Neoadjuvant chemotherapy   BREAST CANCER HISTORY: From the original intake note:  "Michele Alvarez" had routine screening mammography at [facility] 08/07/2015. There was an area of calcification in the right breast 2:00 position. There was also an area of calcification in the left breast 7:00 position. She was recalled for bilateral diagnostic mammography 08/12/2015. The left breast calcifications were felt to be likely benign and repeat mammography in 6 months was suggested.  On the right, breast density was category D. There was a 1.2 cm irregular mass at the 6:00, inframammary position and also suspicious calcifications. Adding those together the area measured 2.5 cm. Ultrasound of the right breast on the same day found a 1.3 cm lobulated mass in the right breast at the 6:00 position. There was a 1.7 cm axillary lymph node with a fatty hilum.   On 08/30/2015 the patient underwent biopsy of the breast mass, the abnormal lymph node, and the area of calcifications. The calcifications ([id number]) were associated with ductal carcinoma in situ, with the prognostic panel pending. 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 The lymph node also was positive.  Note that the clip to the patient's breast mass did not deploy.  The patient's subsequent history is as detailed below  INTERVAL HISTORY: Michele Alvarez returns for follow up of her estrogen receptor positive breast cancer, accompanied by her sister. Today is day 1, cycle 3 of 4 planned cycles of doxorubicin and cyclophosphamide, with neulasta for granulocyte support.  REVIEW OF SYSTEMS: Michele Alvarez has her taste buds and appetite back. As a result she is feeling stronger. She denies fevers, chills, nausea, or vomiting. She is moving her bowels every other day. She had a singular oral lesion that resolved quickly by rinsing with warm salt water. Her energy level is good. She continues to sleep well without the use of lorazepam. A detailed review of systems is otherwise stable.  PAST MEDICAL HISTORY: Past Medical History  Diagnosis Date  . Breast cancer of lower-outer quadrant of right female breast ([place]) 08/19/2015  . Hot flashes   . PONV (postoperative nausea and vomiting)     PAST SURGICAL HISTORY: Past Surgical History  Procedure Laterality Date  . Neck surgery  Sep 13, 1999  . Back surgery      x2, lumbar  . Portacath placement Right 09/09/2015    Procedure: INSERTION PORT-A-CATH WITH [place] ;  Surgeon: [name], MD;  Location: [facility];  Service: General;  Laterality: Right;    FAMILY HISTORY No family history on file. The patient's father died in 09/12/2014 at the age of 55. He had significant COPD. The patient's mother died at the age of 52 from Parkinson's disease. The patient has 1 brother, 1 sister. There is no cancer history in the family to her knowledge.  GYNECOLOGIC HISTORY:  No  LMP recorded. Patient is postmenopausal. Menarche age 16. She has GI asked P0. She stopped having periods in January 2014. She never took hormone replacement or oral contraceptives.  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 She lives with her partner, [name], and 2 cats.     ADVANCED DIRECTIVES: Not in place. At the 09/01/2015 clinic visit the patient was given the appropriate forms to complete and notarize at her discretion.   HEALTH MAINTENANCE: Social History  Substance Use Topics  . Smoking status: Never Smoker   . Smokeless tobacco: Not on file  . Alcohol Use: Yes     Comment: social     Colonoscopy: Never   PAP:  Bone density: Never  Lipid panel:  Allergies  Allergen Reactions  . Latex Other (See Comments)    Fever blisters  . Acyclovir And Related   . Skin Adhesives Dermatitis    Reaction to dermabond, do not use    Current Outpatient Prescriptions  Medication Sig Dispense Refill  . dexamethasone (DECADRON) 4 MG tablet Take 2 tablets by mouth once a day on the day after chemotherapy and then take 2 tablets two times a day for 2 days. Take with food. 30 tablet 1  . lidocaine-prilocaine (EMLA) cream Apply to affected area once 30 g 3  . omeprazole (PRILOSEC) 40 MG capsule Take 1 capsule (40 mg total) by mouth daily. 60 capsule 3  . ondansetron (ZOFRAN) 8 MG tablet Take 1 tablet (8 mg total) by mouth 2 (two) times daily as needed. Start on the third day after chemotherapy. 30 tablet 1  . LORazepam (ATIVAN) 0.5 MG tablet Take 1 tablet (0.5 mg total) by mouth every 6 (six) hours as needed (Nausea or vomiting). (Patient not taking: Reported on 10/14/2015) 30 tablet 0  . prochlorperazine (COMPAZINE) 10 MG tablet Take 1 tablet (10 mg total) by mouth every 6 (six) hours as needed (Nausea or vomiting). (Patient not taking: Reported on 09/30/2015) 30 tablet 1   No current facility-administered medications for this visit.    OBJECTIVE: Middle-aged white woman In no acute distress Filed Vitals:   10/14/15 0925  BP: 122/69  Pulse: 92  Temp: 98.2 F (36.8 C)  Resp: 18     Body mass index is 24.82 kg/(m^2).    ECOG FS:1 - Symptomatic but completely ambulatory  Sclerae unicteric,  pupils round and equal Oropharynx clear and moist-- no thrush or other lesions No cervical or supraclavicular adenopathy Lungs no rales or rhonchi Heart regular rate and rhythm Abd soft, nontender, positive bowel sounds MSK no focal spinal tenderness, no upper extremity lymphedema Neuro: nonfocal, well oriented, appropriate affect Breasts: deferred  LAB RESULTS:  CMP     Component Value Date/Time   NA 146* 10/14/2015 0859   K 3.9 10/14/2015 0859   CO2 28 10/14/2015 0859   GLUCOSE 94 10/14/2015 0859   BUN 14.7 10/14/2015 0859   CREATININE 0.8 10/14/2015 0859   CALCIUM 9.5 10/14/2015 0859   PROT 6.5 10/14/2015 0859   ALBUMIN 3.9 10/14/2015 0859   AST 19 10/14/2015 0859   ALT 22 10/14/2015 0859   ALKPHOS 88 10/14/2015 0859   BILITOT <0.30 10/14/2015 0859    INo results found for: SPEP, UPEP  Lab Results  Component Value Date   WBC 5.4 10/14/2015   NEUTROABS 3.4 10/14/2015   HGB 11.9 10/14/2015   HCT 36.4 10/14/2015   MCV 95.8 10/14/2015   PLT 226 10/14/2015      Chemistry  Component Value Date/Time   NA 146* 10/14/2015 0859   K 3.9 10/14/2015 0859   CO2 28 10/14/2015 0859   BUN 14.7 10/14/2015 0859   CREATININE 0.8 10/14/2015 0859      Component Value Date/Time   CALCIUM 9.5 10/14/2015 0859   ALKPHOS 88 10/14/2015 0859   AST 19 10/14/2015 0859   ALT 22 10/14/2015 0859   BILITOT <0.30 10/14/2015 0859       No results found for: LABCA2  No components found for: LABCA125  No results for input(s): INR in the last 168 hours.  Urinalysis No results found for: COLORURINE, APPEARANCEUR, LABSPEC, PHURINE, GLUCOSEU, HGBUR, BILIRUBINUR, KETONESUR, PROTEINUR, UROBILINOGEN, NITRITE, LEUKOCYTESUR  ELIGIBLE FOR AVAILABLE RESEARCH PROTOCOL: Alliance, PREVENT  STUDIES: No results found.   ASSESSMENT: 55 y.o. [place] woman status post right breast and axillary lymph node biopsy 08/30/2015, both positive for a clinical T1-2 N1, stage II invasive ductal  carcinoma, grade 3, estrogen and progesterone receptor positive, HER-2/neu nonamplified, with an MIB-1 of 80%  (1) neoadjuvant chemotherapy started 09/16/2015 consisting of doxorubicin and cyclophosphamide in dose dense fashion 4, to be followed by paclitaxel weekly 12  (2) definitive surgery to follow with consideration of the Alliance trial  (3) adjuvant radiation to follow surgery  (4) adjuvant antiestrogen to begin at the completion of local therapy  PLAN: Michele Alvarez feels more like herself this week. The labs were reviewed in detail and were entirely stable. She will proceed with cycle 3 of doxorubicin and cyclophosphamide as planned today.   I have ordered her midpoint breast MRI which will be completed after the 4th cycle is finished.   Michele Alvarez will return in 1 week for labs and a nadir visit. She understands and agrees with this plan. She knows the goal of treatment in her case is cure. She has been encouraged to call with any issues that might arise before her next visit here.  Laurie Panda, NP   10/14/2015 10:09 AM

## 2015-10-14 NOTE — Telephone Encounter (Signed)
appt made and avs will print in treatment room °

## 2015-10-14 NOTE — Patient Instructions (Signed)
[facility] Discharge Instructions for Patients Receiving Chemotherapy  Today you received the following chemotherapy agents: Adriamycin and Cytoxan.  To help prevent nausea and vomiting after your treatment, we encourage you to take your nausea medication as prescribed.   If you develop nausea and vomiting that is not controlled by your nausea medication, call the clinic.   BELOW ARE SYMPTOMS THAT SHOULD BE REPORTED IMMEDIATELY:  *FEVER GREATER THAN 100.5 F  *CHILLS WITH OR WITHOUT FEVER  NAUSEA AND VOMITING THAT IS NOT CONTROLLED WITH YOUR NAUSEA MEDICATION  *UNUSUAL SHORTNESS OF BREATH  *UNUSUAL BRUISING OR BLEEDING  TENDERNESS IN MOUTH AND THROAT WITH OR WITHOUT PRESENCE OF ULCERS  *URINARY PROBLEMS  *BOWEL PROBLEMS  UNUSUAL RASH Items with * indicate a potential emergency and should be followed up as soon as possible.  Feel free to call the clinic you have any questions or concerns. The clinic phone number is [phone].  Please show the [place] at check-in to the Emergency Department and triage nurse.  Doxorubicin injection What is this medicine? DOXORUBICIN (dox oh ROO bi sin) is a chemotherapy drug. It is used to treat many kinds of cancer like Hodgkin's disease, leukemia, non-Hodgkin's lymphoma, neuroblastoma, sarcoma, and Wilms' tumor. It is also used to treat bladder cancer, breast cancer, lung cancer, ovarian cancer, stomach cancer, and thyroid cancer. This medicine may be used for other purposes; ask your health care provider or pharmacist if you have questions. What should I tell my health care provider before I take this medicine? They need to know if you have any of these conditions: -blood disorders -heart disease, recent heart attack -infection (especially a virus infection such as chickenpox, cold sores, or herpes) -irregular heartbeat -liver disease -recent or ongoing radiation therapy -an unusual or allergic reaction to  doxorubicin, other chemotherapy agents, other medicines, foods, dyes, or preservatives -pregnant or trying to get pregnant -breast-feeding How should I use this medicine? This drug is given as an infusion into a vein. It is administered in a hospital or clinic by a specially trained health care professional. If you have pain, swelling, burning or any unusual feeling around the site of your injection, tell your health care professional right away. Talk to your pediatrician regarding the use of this medicine in children. Special care may be needed. Overdosage: If you think you have taken too much of this medicine contact a poison control center or emergency room at once. NOTE: This medicine is only for you. Do not share this medicine with others. What if I miss a dose? It is important not to miss your dose. Call your doctor or health care professional if you are unable to keep an appointment. What may interact with this medicine? Do not take this medicine with any of the following medications: -cisapride -droperidol -halofantrine -pimozide -zidovudine This medicine may also interact with the following medications: -chloroquine -chlorpromazine -clarithromycin -cyclophosphamide -cyclosporine -erythromycin -medicines for depression, anxiety, or psychotic disturbances -medicines for irregular heart beat like amiodarone, bepridil, dofetilide, encainide, flecainide, propafenone, quinidine -medicines for seizures like ethotoin, fosphenytoin, phenytoin -medicines for nausea, vomiting like dolasetron, ondansetron, palonosetron -medicines to increase blood counts like filgrastim, pegfilgrastim, sargramostim -methadone -methotrexate -pentamidine -progesterone -vaccines -verapamil Talk to your doctor or health care professional before taking any of these medicines: -acetaminophen -aspirin -ibuprofen -ketoprofen -naproxen This list may not describe all possible interactions. Give your  health care provider a list of all the medicines, herbs, non-prescription drugs, or dietary supplements you use. Also tell  them if you smoke, drink alcohol, or use illegal drugs. Some items may interact with your medicine. What should I watch for while using this medicine? Your condition will be monitored carefully while you are receiving this medicine. You will need important blood work done while you are taking this medicine. This drug may make you feel generally unwell. This is not uncommon, as chemotherapy can affect healthy cells as well as cancer cells. Report any side effects. Continue your course of treatment even though you feel ill unless your doctor tells you to stop. Your urine may turn red for a few days after your dose. This is not blood. If your urine is dark or brown, call your doctor. In some cases, you may be given additional medicines to help with side effects. Follow all directions for their use. Call your doctor or health care professional for advice if you get a fever, chills or sore throat, or other symptoms of a cold or flu. Do not treat yourself. This drug decreases your body's ability to fight infections. Try to avoid being around people who are sick. This medicine may increase your risk to bruise or bleed. Call your doctor or health care professional if you notice any unusual bleeding. Be careful brushing and flossing your teeth or using a toothpick because you may get an infection or bleed more easily. If you have any dental work done, tell your dentist you are receiving this medicine. Avoid taking products that contain aspirin, acetaminophen, ibuprofen, naproxen, or ketoprofen unless instructed by your doctor. These medicines may hide a fever. Men and women of childbearing age should use effective birth control methods while using taking this medicine. Do not become pregnant while taking this medicine. There is a potential for serious side effects to an unborn child. Talk to  your health care professional or pharmacist for more information. Do not breast-feed an infant while taking this medicine. Do not let others touch your urine or other body fluids for 5 days after each treatment with this medicine. Caregivers should wear latex gloves to avoid touching body fluids during this time. There is a maximum amount of this medicine you should receive throughout your life. The amount depends on the medical condition being treated and your overall health. Your doctor will watch how much of this medicine you receive in your lifetime. Tell your doctor if you have taken this medicine before. What side effects may I notice from receiving this medicine? Side effects that you should report to your doctor or health care professional as soon as possible: -allergic reactions like skin rash, itching or hives, swelling of the face, lips, or tongue -low blood counts - this medicine may decrease the number of white blood cells, red blood cells and platelets. You may be at increased risk for infections and bleeding. -signs of infection - fever or chills, cough, sore throat, pain or difficulty passing urine -signs of decreased platelets or bleeding - bruising, pinpoint red spots on the skin, black, tarry stools, blood in the urine -signs of decreased red blood cells - unusually weak or tired, fainting spells, lightheadedness -breathing problems -chest pain -fast, irregular heartbeat -mouth sores -nausea, vomiting -pain, swelling, redness at site where injected -pain, tingling, numbness in the hands or feet -swelling of ankles, feet, or hands -unusual bleeding or bruising Side effects that usually do not require medical attention (report to your doctor or health care professional if they continue or are bothersome): -diarrhea -facial flushing -hair loss -loss of appetite -  missed menstrual periods -nail discoloration or damage -red or watery eyes -red colored urine -stomach  upset This list may not describe all possible side effects. Call your doctor for medical advice about side effects. You may report side effects to FDA at 1-800-FDA-1088. Where should I keep my medicine? This drug is given in a hospital or clinic and will not be stored at home. NOTE: This sheet is a summary. It may not cover all possible information. If you have questions about this medicine, talk to your doctor, pharmacist, or health care provider.    2016, Elsevier/Gold Standard. (2012-10-29 09:54:34)   Cyclophosphamide injection What is this medicine? CYCLOPHOSPHAMIDE (sye kloe FOSS fa mide) is a chemotherapy drug. It slows the growth of cancer cells. This medicine is used to treat many types of cancer like lymphoma, myeloma, leukemia, breast cancer, and ovarian cancer, to name a few. This medicine may be used for other purposes; ask your health care provider or pharmacist if you have questions. What should I tell my health care provider before I take this medicine? They need to know if you have any of these conditions: -blood disorders -history of other chemotherapy -infection -kidney disease -liver disease -recent or ongoing radiation therapy -tumors in the bone marrow -an unusual or allergic reaction to cyclophosphamide, other chemotherapy, other medicines, foods, dyes, or preservatives -pregnant or trying to get pregnant -breast-feeding How should I use this medicine? This drug is usually given as an injection into a vein or muscle or by infusion into a vein. It is administered in a hospital or clinic by a specially trained health care professional. Talk to your pediatrician regarding the use of this medicine in children. Special care may be needed. Overdosage: If you think you have taken too much of this medicine contact a poison control center or emergency room at once. NOTE: This medicine is only for you. Do not share this medicine with others. What if I miss a dose? It is  important not to miss your dose. Call your doctor or health care professional if you are unable to keep an appointment. What may interact with this medicine? This medicine may interact with the following medications: -amiodarone -amphotericin B -azathioprine -certain antiviral medicines for HIV or AIDS such as protease inhibitors (e.g., indinavir, ritonavir) and zidovudine -certain blood pressure medications such as benazepril, captopril, enalapril, fosinopril, lisinopril, moexipril, monopril, perindopril, quinapril, ramipril, trandolapril -certain cancer medications such as anthracyclines (e.g., daunorubicin, doxorubicin), busulfan, cytarabine, paclitaxel, pentostatin, tamoxifen, trastuzumab -certain diuretics such as chlorothiazide, chlorthalidone, hydrochlorothiazide, indapamide, metolazone -certain medicines that treat or prevent blood clots like warfarin -certain muscle relaxants such as succinylcholine -cyclosporine -etanercept -indomethacin -medicines to increase blood counts like filgrastim, pegfilgrastim, sargramostim -medicines used as general anesthesia -metronidazole -natalizumab This list may not describe all possible interactions. Give your health care provider a list of all the medicines, herbs, non-prescription drugs, or dietary supplements you use. Also tell them if you smoke, drink alcohol, or use illegal drugs. Some items may interact with your medicine. What should I watch for while using this medicine? Visit your doctor for checks on your progress. This drug may make you feel generally unwell. This is not uncommon, as chemotherapy can affect healthy cells as well as cancer cells. Report any side effects. Continue your course of treatment even though you feel ill unless your doctor tells you to stop. Drink water or other fluids as directed. Urinate often, even at night. In some cases, you may be given additional medicines to help  with side effects. Follow all directions for  their use. Call your doctor or health care professional for advice if you get a fever, chills or sore throat, or other symptoms of a cold or flu. Do not treat yourself. This drug decreases your body's ability to fight infections. Try to avoid being around people who are sick. This medicine may increase your risk to bruise or bleed. Call your doctor or health care professional if you notice any unusual bleeding. Be careful brushing and flossing your teeth or using a toothpick because you may get an infection or bleed more easily. If you have any dental work done, tell your dentist you are receiving this medicine. You may get drowsy or dizzy. Do not drive, use machinery, or do anything that needs mental alertness until you know how this medicine affects you. Do not become pregnant while taking this medicine or for 1 year after stopping it. Women should inform their doctor if they wish to become pregnant or think they might be pregnant. Men should not father a child while taking this medicine and for 4 months after stopping it. There is a potential for serious side effects to an unborn child. Talk to your health care professional or pharmacist for more information. Do not breast-feed an infant while taking this medicine. This medicine may interfere with the ability to have a child. This medicine has caused ovarian failure in some women. This medicine has caused reduced sperm counts in some men. You should talk with your doctor or health care professional if you are concerned about your fertility. If you are going to have surgery, tell your doctor or health care professional that you have taken this medicine. What side effects may I notice from receiving this medicine? Side effects that you should report to your doctor or health care professional as soon as possible: -allergic reactions like skin rash, itching or hives, swelling of the face, lips, or tongue -low blood counts - this medicine may decrease the  number of white blood cells, red blood cells and platelets. You may be at increased risk for infections and bleeding. -signs of infection - fever or chills, cough, sore throat, pain or difficulty passing urine -signs of decreased platelets or bleeding - bruising, pinpoint red spots on the skin, black, tarry stools, blood in the urine -signs of decreased red blood cells - unusually weak or tired, fainting spells, lightheadedness -breathing problems -dark urine -dizziness -palpitations -swelling of the ankles, feet, hands -trouble passing urine or change in the amount of urine -weight gain -yellowing of the eyes or skin Side effects that usually do not require medical attention (report to your doctor or health care professional if they continue or are bothersome): -changes in nail or skin color -hair loss -missed menstrual periods -mouth sores -nausea, vomiting This list may not describe all possible side effects. Call your doctor for medical advice about side effects. You may report side effects to FDA at 1-800-FDA-1088. Where should I keep my medicine? This drug is given in a hospital or clinic and will not be stored at home. NOTE: This sheet is a summary. It may not cover all possible information. If you have questions about this medicine, talk to your doctor, pharmacist, or health care provider.    2016, Elsevier/Gold Standard. (2012-05-17 16:22:58)

## 2015-10-21 ENCOUNTER — Ambulatory Visit (HOSPITAL_BASED_OUTPATIENT_CLINIC_OR_DEPARTMENT_OTHER): Payer: 59 | Admitting: Nurse Practitioner

## 2015-10-21 ENCOUNTER — Other Ambulatory Visit (HOSPITAL_BASED_OUTPATIENT_CLINIC_OR_DEPARTMENT_OTHER): Payer: 59

## 2015-10-21 ENCOUNTER — Telehealth: Payer: Self-pay | Admitting: *Deleted

## 2015-10-21 ENCOUNTER — Telehealth: Payer: Self-pay | Admitting: Nurse Practitioner

## 2015-10-21 ENCOUNTER — Encounter: Payer: Self-pay | Admitting: Nurse Practitioner

## 2015-10-21 VITALS — BP 107/65 | HR 75 | Temp 97.9°F | Resp 18 | Ht 68.0 in | Wt 95.1 lb

## 2015-10-21 DIAGNOSIS — C50511 Malignant neoplasm of lower-outer quadrant of right female breast: Secondary | ICD-10-CM

## 2015-10-21 DIAGNOSIS — C773 Secondary and unspecified malignant neoplasm of axilla and upper limb lymph nodes: Secondary | ICD-10-CM

## 2015-10-21 DIAGNOSIS — Z170 Estrogen receptor positive status [ER+]: Secondary | ICD-10-CM | POA: Diagnosis not present

## 2015-10-21 DIAGNOSIS — C50811 Malignant neoplasm of overlapping sites of right female breast: Secondary | ICD-10-CM | POA: Diagnosis not present

## 2015-10-21 DIAGNOSIS — K5909 Other constipation: Secondary | ICD-10-CM

## 2015-10-21 DIAGNOSIS — T451X5A Adverse effect of antineoplastic and immunosuppressive drugs, initial encounter: Secondary | ICD-10-CM

## 2015-10-21 DIAGNOSIS — D6959 Other secondary thrombocytopenia: Secondary | ICD-10-CM

## 2015-10-21 DIAGNOSIS — K5900 Constipation, unspecified: Secondary | ICD-10-CM

## 2015-10-21 DIAGNOSIS — D696 Thrombocytopenia, unspecified: Secondary | ICD-10-CM

## 2015-10-21 DIAGNOSIS — D6481 Anemia due to antineoplastic chemotherapy: Secondary | ICD-10-CM

## 2015-10-21 LAB — CBC WITH DIFFERENTIAL/PLATELET
BASO%: 0.4 % (ref 0.0–2.0)
BASOS ABS: 0 10*3/uL (ref 0.0–0.1)
EOS ABS: 0 10*3/uL (ref 0.0–0.5)
EOS%: 0.4 % (ref 0.0–7.0)
HCT: 31.6 % — ABNORMAL LOW (ref 34.8–46.6)
HEMOGLOBIN: 10.3 g/dL — AB (ref 11.6–15.9)
LYMPH%: 20 % (ref 14.0–49.7)
MCH: 30.7 pg (ref 25.1–34.0)
MCHC: 32.6 g/dL (ref 31.5–36.0)
MCV: 94.2 fL (ref 79.5–101.0)
MONO#: 0.2 10*3/uL (ref 0.1–0.9)
MONO%: 7.4 % (ref 0.0–14.0)
NEUT%: 71.8 % (ref 38.4–76.8)
NEUTROS ABS: 1.7 10*3/uL (ref 1.5–6.5)
PLATELETS: 109 10*3/uL — AB (ref 145–400)
RBC: 3.35 10*6/uL — ABNORMAL LOW (ref 3.70–5.45)
RDW: 13.9 % (ref 11.2–14.5)
WBC: 2.4 10*3/uL — AB (ref 3.9–10.3)
lymph#: 0.5 10*3/uL — ABNORMAL LOW (ref 0.9–3.3)

## 2015-10-21 LAB — COMPREHENSIVE METABOLIC PANEL
ALBUMIN: 3.5 g/dL (ref 3.5–5.0)
ALK PHOS: 113 U/L (ref 40–150)
ALT: 35 U/L (ref 0–55)
ANION GAP: 5 meq/L (ref 3–11)
AST: 16 U/L (ref 5–34)
BUN: 18 mg/dL (ref 7.0–26.0)
CALCIUM: 9.2 mg/dL (ref 8.4–10.4)
CO2: 30 mEq/L — ABNORMAL HIGH (ref 22–29)
Chloride: 108 mEq/L (ref 98–109)
Creatinine: 0.7 mg/dL (ref 0.6–1.1)
Glucose: 108 mg/dl (ref 70–140)
POTASSIUM: 4.8 meq/L (ref 3.5–5.1)
Sodium: 143 mEq/L (ref 136–145)
TOTAL PROTEIN: 5.9 g/dL — AB (ref 6.4–8.3)
Total Bilirubin: 0.51 mg/dL (ref 0.20–1.20)

## 2015-10-21 NOTE — Progress Notes (Signed)
[place]  Telephone:(336) [phone] [phone]) [phone]     ID: Michele Alvarez DOB: Dec 10, 1960  MR#: [id number]  CBU#:[id number]  Patient Care Team: Jonathon Jordan, MD as PCP - General (Family Medicine) Avon Gully, NP as Nurse Practitioner (Obstetrics and Gynecology) [name], MD as Consulting Physician (General Surgery) Chauncey Cruel, MD as Consulting Physician (Oncology) Eppie Gibson, MD as Attending Physician (Radiation Oncology) Sylvan Cheese, NP as Nurse Practitioner (Hematology and Oncology) Jovita Gamma, MD as Consulting Physician (Neurosurgery) PCP: Lilian Coma, MD OTHER MD:  CHIEF COMPLAINT: Node-positive breast cancer  CURRENT TREATMENT: Neoadjuvant chemotherapy   BREAST CANCER HISTORY: From the original intake note:  "Michele Alvarez" had routine screening mammography at [facility] 08/07/2015. There was an area of calcification in the right breast 2:00 position. There was also an area of calcification in the left breast 7:00 position. She was recalled for bilateral diagnostic mammography 08/12/2015. The left breast calcifications were felt to be likely benign and repeat mammography in 6 months was suggested.  On the right, breast density was category D. There was a 1.2 cm irregular mass at the 6:00, inframammary position and also suspicious calcifications. Adding those together the area measured 2.5 cm. Ultrasound of the right breast on the same day found a 1.3 cm lobulated mass in the right breast at the 6:00 position. There was a 1.7 cm axillary lymph node with a fatty hilum.   On 08/30/2015 the patient underwent biopsy of the breast mass, the abnormal lymph node, and the area of calcifications. The calcifications ([id number]) were associated with ductal carcinoma in situ, with the prognostic panel pending. 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 The lymph node also was positive.  Note that the clip to the patient's breast mass did not deploy.  The patient's subsequent history is as detailed below  INTERVAL HISTORY: Michele Alvarez returns for follow up of her estrogen receptor positive breast cancer, accompanied by her partner, [name]. Today is day 8, cycle 3 of 4 planned cycles of doxorubicin and cyclophosphamide, with neulasta for granulocyte support.  REVIEW OF SYSTEMS: Michele Alvarez has noticed that on days 3-4 her voice gets softer and weaker. She denies sore throat, difficulty breathing, or wheezing. It improved on its own by day 5 and does not occur again until the next cycle. She denies fever, chills, nausea, or vomiting. She has been constipated this week, and take miralax but not regularly. She is still moving her bowels but in small quantities. She feels bloated. Her appetite is fair secondary to taste changes. She is hardly interested in water. She denies mouth sores, rashes, or neuropathy symptoms beyond her baseline. She continues to work most days with decent endurance. A detailed review of systems is otherwise stable.  PAST MEDICAL HISTORY: Past Medical History  Diagnosis Date  . Breast cancer of lower-outer quadrant of right female breast ([place]) 08/19/2015  . Hot flashes   . PONV (postoperative nausea and vomiting)     PAST SURGICAL HISTORY: Past Surgical History  Procedure Laterality Date  . Neck surgery  2001  . Back surgery      x2, lumbar  . Portacath placement Right 09/09/2015    Procedure: INSERTION PORT-A-CATH WITH [place] ;  Surgeon: [name], MD;  Location: [facility];  Service: General;  Laterality: Right;    FAMILY HISTORY No family history on file. The patient's father died in  2016 at the age of 27. He had significant COPD. The patient's mother died at the age of 61 from Parkinson's disease. The patient has 1 brother, 1 sister. There is no cancer history in the  family to her knowledge.  GYNECOLOGIC HISTORY:  No LMP recorded. Patient is postmenopausal. Menarche age 50. She has GI asked P0. She stopped having periods in January 2014. She never took hormone replacement or oral contraceptives.  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 She lives with her partner, [name], and 2 cats.     ADVANCED DIRECTIVES: Not in place. At the 09/01/2015 clinic visit the patient was given the appropriate forms to complete and notarize at her discretion.   HEALTH MAINTENANCE: Social History  Substance Use Topics  . Smoking status: Never Smoker   . Smokeless tobacco: Not on file  . Alcohol Use: Yes     Comment: social     Colonoscopy: Never   PAP:  Bone density: Never  Lipid panel:  Allergies  Allergen Reactions  . Latex Other (See Comments)    Fever blisters  . Acyclovir And Related   . Skin Adhesives Dermatitis    Reaction to dermabond, do not use    Current Outpatient Prescriptions  Medication Sig Dispense Refill  . dexamethasone (DECADRON) 4 MG tablet Take 2 tablets by mouth once a day on the day after chemotherapy and then take 2 tablets two times a day for 2 days. Take with food. 30 tablet 1  . lidocaine-prilocaine (EMLA) cream Apply to affected area once 30 g 3  . omeprazole (PRILOSEC) 40 MG capsule Take 1 capsule (40 mg total) by mouth daily. 60 capsule 3  . ondansetron (ZOFRAN) 8 MG tablet Take 1 tablet (8 mg total) by mouth 2 (two) times daily as needed. Start on the third day after chemotherapy. 30 tablet 1  . LORazepam (ATIVAN) 0.5 MG tablet Take 1 tablet (0.5 mg total) by mouth every 6 (six) hours as needed (Nausea or vomiting). (Patient not taking: Reported on 10/14/2015) 30 tablet 0  . prochlorperazine (COMPAZINE) 10 MG tablet Take 1 tablet (10 mg total) by mouth every 6 (six) hours as needed (Nausea or vomiting). (Patient not taking: Reported on 09/30/2015) 30 tablet 1   No current facility-administered medications for  this visit.    OBJECTIVE: Middle-aged white woman In no acute distress Filed Vitals:   10/21/15 0834  BP: 107/65  Pulse: 75  Temp: 97.9 F (36.6 C)  Resp: 18     Body mass index is 14.46 kg/(m^2).    ECOG FS:1 - Symptomatic but completely ambulatory  Skin: warm, dry  HEENT: sclerae anicteric, conjunctivae pink, oropharynx clear. No thrush or mucositis.  Lymph Nodes: No cervical or supraclavicular lymphadenopathy  Lungs: clear to auscultation bilaterally, no rales, wheezes, or rhonci  Heart: regular rate and rhythm  Abdomen: round, soft, non tender, positive bowel sounds  Musculoskeletal: No focal spinal tenderness, no peripheral edema  Neuro: non focal, well oriented, positive affect  Breasts: deferred  LAB RESULTS:  CMP     Component Value Date/Time   NA 143 10/21/2015 0751   K 4.8 10/21/2015 0751   CO2 30* 10/21/2015 0751   GLUCOSE 108 10/21/2015 0751   BUN 18.0 10/21/2015 0751   CREATININE 0.7 10/21/2015 0751   CALCIUM 9.2 10/21/2015 0751   PROT 5.9* 10/21/2015 0751   ALBUMIN 3.5 10/21/2015 0751   AST 16 10/21/2015 0751   ALT 35 10/21/2015 0751   ALKPHOS 113  10/21/2015 0751   BILITOT 0.51 10/21/2015 0751    INo results found for: SPEP, UPEP  Lab Results  Component Value Date   WBC 2.4* 10/21/2015   NEUTROABS 1.7 10/21/2015   HGB 10.3* 10/21/2015   HCT 31.6* 10/21/2015   MCV 94.2 10/21/2015   PLT 109* 10/21/2015      Chemistry      Component Value Date/Time   NA 143 10/21/2015 0751   K 4.8 10/21/2015 0751   CO2 30* 10/21/2015 0751   BUN 18.0 10/21/2015 0751   CREATININE 0.7 10/21/2015 0751      Component Value Date/Time   CALCIUM 9.2 10/21/2015 0751   ALKPHOS 113 10/21/2015 0751   AST 16 10/21/2015 0751   ALT 35 10/21/2015 0751   BILITOT 0.51 10/21/2015 0751       No results found for: LABCA2  No components found for: LABCA125  No results for input(s): INR in the last 168 hours.  Urinalysis No results found for: COLORURINE,  APPEARANCEUR, LABSPEC, PHURINE, GLUCOSEU, HGBUR, BILIRUBINUR, KETONESUR, PROTEINUR, UROBILINOGEN, NITRITE, LEUKOCYTESUR  ELIGIBLE FOR AVAILABLE RESEARCH PROTOCOL: Alliance, PREVENT  STUDIES: No results found.   ASSESSMENT: 55 y.o. [place] woman status post right breast and axillary lymph node biopsy 08/30/2015, both positive for a clinical T1-2 N1, stage II invasive ductal carcinoma, grade 3, estrogen and progesterone receptor positive, HER-2/neu nonamplified, with an MIB-1 of 80%  (1) neoadjuvant chemotherapy started 09/16/2015 consisting of doxorubicin and cyclophosphamide in dose dense fashion 4, to be followed by paclitaxel weekly 12  (2) definitive surgery to follow with consideration of the Alliance trial  (3) adjuvant radiation to follow surgery  (4) adjuvant antiestrogen to begin at the completion of local therapy  PLAN: Michele Alvarez continues to tolerate treatment well. The labs were reviewed in detail and she has treatment related anemia and thrombocytopenia, but both values should improve by next week.   For constipation she will start on stool softeners in addition to miralax. We talked about ways to improve her water intake as well.  Michele Alvarez received rejection letters for her pretreatment mammogram, biopsy, and MRI. We are working with her breast navigator, Iris Pert, to contact [place] about possible coding errors.  Michele Alvarez will return in 1 week for cycle 4 of cyclophosphamide and doxorubicin. She understands and agrees with this plan. She knows the goal of treatment in her case is cure. She has been encouraged to call with any issues that might arise before her next visit here.  Laurie Panda, NP   10/21/2015 9:14 AM

## 2015-10-21 NOTE — Telephone Encounter (Signed)
Per staff message and POF I have scheduled appts. Advised scheduler of appts. JMW  

## 2015-10-21 NOTE — Telephone Encounter (Signed)
appt made and avs printed. Staff message sent to chemo scheduler to sch rx on 4/26

## 2015-10-28 ENCOUNTER — Telehealth: Payer: Self-pay | Admitting: Nurse Practitioner

## 2015-10-28 ENCOUNTER — Ambulatory Visit (HOSPITAL_BASED_OUTPATIENT_CLINIC_OR_DEPARTMENT_OTHER): Payer: 59

## 2015-10-28 ENCOUNTER — Ambulatory Visit (HOSPITAL_BASED_OUTPATIENT_CLINIC_OR_DEPARTMENT_OTHER): Payer: 59 | Admitting: Nurse Practitioner

## 2015-10-28 ENCOUNTER — Encounter: Payer: Self-pay | Admitting: *Deleted

## 2015-10-28 ENCOUNTER — Other Ambulatory Visit: Payer: Self-pay | Admitting: Oncology

## 2015-10-28 ENCOUNTER — Other Ambulatory Visit (HOSPITAL_BASED_OUTPATIENT_CLINIC_OR_DEPARTMENT_OTHER): Payer: 59

## 2015-10-28 ENCOUNTER — Encounter: Payer: Self-pay | Admitting: Nurse Practitioner

## 2015-10-28 VITALS — BP 105/67 | HR 86 | Temp 97.9°F | Resp 18 | Ht 68.0 in | Wt 164.5 lb

## 2015-10-28 DIAGNOSIS — C50511 Malignant neoplasm of lower-outer quadrant of right female breast: Secondary | ICD-10-CM

## 2015-10-28 DIAGNOSIS — C773 Secondary and unspecified malignant neoplasm of axilla and upper limb lymph nodes: Secondary | ICD-10-CM

## 2015-10-28 DIAGNOSIS — Z5111 Encounter for antineoplastic chemotherapy: Secondary | ICD-10-CM

## 2015-10-28 DIAGNOSIS — D6481 Anemia due to antineoplastic chemotherapy: Secondary | ICD-10-CM | POA: Diagnosis not present

## 2015-10-28 DIAGNOSIS — C50811 Malignant neoplasm of overlapping sites of right female breast: Secondary | ICD-10-CM | POA: Diagnosis not present

## 2015-10-28 DIAGNOSIS — T451X5A Adverse effect of antineoplastic and immunosuppressive drugs, initial encounter: Secondary | ICD-10-CM

## 2015-10-28 LAB — CBC WITH DIFFERENTIAL/PLATELET
BASO%: 1.2 % (ref 0.0–2.0)
Basophils Absolute: 0.1 10*3/uL (ref 0.0–0.1)
EOS ABS: 0 10*3/uL (ref 0.0–0.5)
EOS%: 0 % (ref 0.0–7.0)
HCT: 33.6 % — ABNORMAL LOW (ref 34.8–46.6)
HEMOGLOBIN: 10.8 g/dL — AB (ref 11.6–15.9)
LYMPH#: 0.8 10*3/uL — AB (ref 0.9–3.3)
LYMPH%: 19.7 % (ref 14.0–49.7)
MCH: 30.9 pg (ref 25.1–34.0)
MCHC: 32.1 g/dL (ref 31.5–36.0)
MCV: 96 fL (ref 79.5–101.0)
MONO#: 0.4 10*3/uL (ref 0.1–0.9)
MONO%: 10.7 % (ref 0.0–14.0)
NEUT%: 68.4 % (ref 38.4–76.8)
NEUTROS ABS: 2.8 10*3/uL (ref 1.5–6.5)
PLATELETS: 219 10*3/uL (ref 145–400)
RBC: 3.5 10*6/uL — ABNORMAL LOW (ref 3.70–5.45)
RDW: 15.3 % — AB (ref 11.2–14.5)
WBC: 4.1 10*3/uL (ref 3.9–10.3)

## 2015-10-28 LAB — COMPREHENSIVE METABOLIC PANEL
ALBUMIN: 3.8 g/dL (ref 3.5–5.0)
ALK PHOS: 83 U/L (ref 40–150)
ALT: 22 U/L (ref 0–55)
ANION GAP: 6 meq/L (ref 3–11)
AST: 19 U/L (ref 5–34)
BUN: 13.8 mg/dL (ref 7.0–26.0)
CO2: 30 meq/L — AB (ref 22–29)
CREATININE: 0.8 mg/dL (ref 0.6–1.1)
Calcium: 9.4 mg/dL (ref 8.4–10.4)
Chloride: 108 mEq/L (ref 98–109)
EGFR: 82 mL/min/{1.73_m2} — AB (ref >90)
GLUCOSE: 80 mg/dL (ref 70–140)
Potassium: 4.2 mEq/L (ref 3.5–5.1)
Sodium: 144 mEq/L (ref 136–145)
TOTAL PROTEIN: 6.2 g/dL — AB (ref 6.4–8.3)

## 2015-10-28 MED ORDER — SODIUM CHLORIDE 0.9 % IV SOLN
Freq: Once | INTRAVENOUS | Status: AC
  Administered 2015-10-28: 11:00:00 via INTRAVENOUS

## 2015-10-28 MED ORDER — PEGFILGRASTIM 6 MG/0.6ML ~~LOC~~ PSKT
6.0000 mg | PREFILLED_SYRINGE | Freq: Once | SUBCUTANEOUS | Status: AC
  Administered 2015-10-28: 6 mg via SUBCUTANEOUS
  Filled 2015-10-28: qty 0.6

## 2015-10-28 MED ORDER — PALONOSETRON HCL INJECTION 0.25 MG/5ML
0.2500 mg | Freq: Once | INTRAVENOUS | Status: AC
  Administered 2015-10-28: 0.25 mg via INTRAVENOUS

## 2015-10-28 MED ORDER — SODIUM CHLORIDE 0.9% FLUSH
10.0000 mL | INTRAVENOUS | Status: DC | PRN
  Administered 2015-10-28: 10 mL
  Filled 2015-10-28: qty 10

## 2015-10-28 MED ORDER — PALONOSETRON HCL INJECTION 0.25 MG/5ML
INTRAVENOUS | Status: AC
  Filled 2015-10-28: qty 5

## 2015-10-28 MED ORDER — HEPARIN SOD (PORK) LOCK FLUSH 100 UNIT/ML IV SOLN
500.0000 [IU] | Freq: Once | INTRAVENOUS | Status: AC | PRN
  Administered 2015-10-28: 500 [IU]
  Filled 2015-10-28: qty 5

## 2015-10-28 MED ORDER — SODIUM CHLORIDE 0.9 % IV SOLN
600.0000 mg/m2 | Freq: Once | INTRAVENOUS | Status: AC
  Administered 2015-10-28: 1140 mg via INTRAVENOUS
  Filled 2015-10-28: qty 57

## 2015-10-28 MED ORDER — SODIUM CHLORIDE 0.9 % IV SOLN
Freq: Once | INTRAVENOUS | Status: AC
  Administered 2015-10-28: 11:00:00 via INTRAVENOUS
  Filled 2015-10-28: qty 5

## 2015-10-28 MED ORDER — DOXORUBICIN HCL CHEMO IV INJECTION 2 MG/ML
60.0000 mg/m2 | Freq: Once | INTRAVENOUS | Status: AC
  Administered 2015-10-28: 114 mg via INTRAVENOUS
  Filled 2015-10-28: qty 57

## 2015-10-28 NOTE — Progress Notes (Signed)
[name]  [phone] Fax:(336) [phone]     ID: Michele Alvarez DOB: 03-31-1961  MR#: [id number]  XQJ#:[id number]  Patient Care Team: Jonathon Jordan, MD as PCP - General (Family Medicine) Avon Gully, NP as Nurse Practitioner (Obstetrics and Gynecology) [name], MD as Consulting Physician (General Surgery) Chauncey Cruel, MD as Consulting Physician (Oncology) Eppie Gibson, MD as Attending Physician (Radiation Oncology) Sylvan Cheese, NP as Nurse Practitioner (Hematology and Oncology) Jovita Gamma, MD as Consulting Physician (Neurosurgery) PCP: Lilian Coma, MD OTHER MD:  CHIEF COMPLAINT: Node-positive breast cancer  CURRENT TREATMENT: Neoadjuvant chemotherapy  BREAST CANCER HISTORY: From the original intake note:  "[name]" had routine screening mammography at [facility] 08/07/2015. There was an area of calcification in the right breast 2:00 position. There was also an area of calcification in the left breast 7:00 position. She was recalled for bilateral diagnostic mammography 08/12/2015. The left breast calcifications were felt to be likely benign and repeat mammography in 6 months was suggested.  On the right, breast density was category D. There was a 1.2 cm irregular mass at the 6:00, inframammary position and also suspicious calcifications. Adding those together the area measured 2.5 cm. Ultrasound of the right breast on the same day found a 1.3 cm lobulated mass in the right breast at the 6:00 position. There was a 1.7 cm axillary lymph node with a fatty hilum.   On 08/30/2015 the patient underwent biopsy of the breast mass, the abnormal lymph node, and the area of calcifications. The calcifications ([id number]) were associated with ductal carcinoma in situ, with the prognostic panel pending. 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 The lymph node also was positive.  Note that the clip to the patient's breast mass did not deploy.  The patient's subsequent history is as detailed below  INTERVAL HISTORY: Michele Alvarez returns for follow up of her estrogen receptor positive breast cancer, accompanied by her partner, [name]. Today is day 1, cycle 4 of 4 planned cycles of doxorubicin and cyclophosphamide, with neulasta for granulocyte support.  REVIEW OF SYSTEMS: Michele Alvarez has felt well the past couple of days. She denies fevers chills, nausea, or vomiting. She has increased her water intake to at least 50 oz daily and is now moving her bowels at least once daily. It is not a large quantity, but enough to keep her from feeling bloated. Her appetite is fair secondary to taste changes, but she does not miss any meals. She denies mouth sores, rashes, or neuropathy symptoms beyond her baseline. She continues to work most days with decent endurance. A detailed review of systems is otherwise stable.  PAST MEDICAL HISTORY: Past Medical History  Diagnosis Date  . Breast cancer of lower-outer quadrant of right female breast ([place]) 08/19/2015  . Hot flashes   . PONV (postoperative nausea and vomiting)     PAST SURGICAL HISTORY: Past Surgical History  Procedure Laterality Date  . Neck surgery  September 18, 1999  . Back surgery      x2, lumbar  . Portacath placement Right 09/09/2015    Procedure: INSERTION PORT-A-CATH WITH [place] ;  Surgeon: [name], MD;  Location: [facility];  Service: General;  Laterality: Right;    FAMILY HISTORY No family history on file. The patient's father died in 09-17-14 at the age of 84. He had significant COPD. The patient's mother died at the age of  77 from Parkinson's disease. The patient has 1 brother, 1 sister. There is no cancer history in the family to her knowledge.  GYNECOLOGIC HISTORY:  No LMP recorded. Patient is postmenopausal. Menarche age 67. She has GI asked P0.  She stopped having periods in January 2014. She never took hormone replacement or oral contraceptives.  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 She lives with her partner, [name], and 2 cats.     ADVANCED DIRECTIVES: Not in place. At the 09/01/2015 clinic visit the patient was given the appropriate forms to complete and notarize at her discretion.   HEALTH MAINTENANCE: Social History  Substance Use Topics  . Smoking status: Never Smoker   . Smokeless tobacco: Not on file  . Alcohol Use: Yes     Comment: social     Colonoscopy: Never   PAP:  Bone density: Never  Lipid panel:  Allergies  Allergen Reactions  . Latex Other (See Comments)    Fever blisters  . Acyclovir And Related   . Skin Adhesives Dermatitis    Reaction to dermabond, do not use    Current Outpatient Prescriptions  Medication Sig Dispense Refill  . dexamethasone (DECADRON) 4 MG tablet Take 2 tablets by mouth once a day on the day after chemotherapy and then take 2 tablets two times a day for 2 days. Take with food. 30 tablet 1  . lidocaine-prilocaine (EMLA) cream Apply to affected area once 30 g 3  . omeprazole (PRILOSEC) 40 MG capsule Take 1 capsule (40 mg total) by mouth daily. 60 capsule 3  . ondansetron (ZOFRAN) 8 MG tablet Take 1 tablet (8 mg total) by mouth 2 (two) times daily as needed. Start on the third day after chemotherapy. 30 tablet 1  . LORazepam (ATIVAN) 0.5 MG tablet Take 1 tablet (0.5 mg total) by mouth every 6 (six) hours as needed (Nausea or vomiting). (Patient not taking: Reported on 10/14/2015) 30 tablet 0  . prochlorperazine (COMPAZINE) 10 MG tablet Take 1 tablet (10 mg total) by mouth every 6 (six) hours as needed (Nausea or vomiting). (Patient not taking: Reported on 09/30/2015) 30 tablet 1   No current facility-administered medications for this visit.    OBJECTIVE: Middle-aged white woman In no acute distress Filed Vitals:   10/28/15 0927  BP: 105/67  Pulse: 86    Temp: 97.9 F (36.6 C)  Resp: 18     Body mass index is 25.02 kg/(m^2).    ECOG FS:1 - Symptomatic but completely ambulatory  Skin: warm, dry  HEENT: sclerae anicteric, conjunctivae pink, oropharynx clear. No thrush or mucositis.  Lymph Nodes: No cervical or supraclavicular lymphadenopathy  Lungs: clear to auscultation bilaterally, no rales, wheezes, or rhonci  Heart: regular rate and rhythm  Abdomen: round, soft, non tender, positive bowel sounds  Musculoskeletal: No focal spinal tenderness, no peripheral edema  Neuro: non focal, well oriented, positive affect  Breasts: deferred  LAB RESULTS:  CMP     Component Value Date/Time   NA 144 10/28/2015 0907   K 4.2 10/28/2015 0907   CO2 30* 10/28/2015 0907   GLUCOSE 80 10/28/2015 0907   BUN 13.8 10/28/2015 0907   CREATININE 0.8 10/28/2015 0907   CALCIUM 9.4 10/28/2015 0907   PROT 6.2* 10/28/2015 0907   ALBUMIN 3.8 10/28/2015 0907   AST 19 10/28/2015 0907   ALT 22 10/28/2015 0907   ALKPHOS 83 10/28/2015 0907   BILITOT <0.30 10/28/2015 0907    INo results found for: SPEP, UPEP  Lab Results  Component Value Date   WBC 4.1 10/28/2015   NEUTROABS 2.8 10/28/2015   HGB 10.8* 10/28/2015   HCT 33.6* 10/28/2015   MCV 96.0 10/28/2015   PLT 219 10/28/2015      Chemistry      Component Value Date/Time   NA 144 10/28/2015 0907   K 4.2 10/28/2015 0907   CO2 30* 10/28/2015 0907   BUN 13.8 10/28/2015 0907   CREATININE 0.8 10/28/2015 0907      Component Value Date/Time   CALCIUM 9.4 10/28/2015 0907   ALKPHOS 83 10/28/2015 0907   AST 19 10/28/2015 0907   ALT 22 10/28/2015 0907   BILITOT <0.30 10/28/2015 0907       No results found for: LABCA2  No components found for: [id number]  No results for input(s): INR in the last 168 hours.  Urinalysis No results found for: COLORURINE, APPEARANCEUR, LABSPEC, PHURINE, GLUCOSEU, HGBUR, BILIRUBINUR, KETONESUR, PROTEINUR, UROBILINOGEN, NITRITE, LEUKOCYTESUR  ELIGIBLE FOR  AVAILABLE RESEARCH PROTOCOL: Alliance, PREVENT  STUDIES: No results found.   ASSESSMENT: 55 y.o. [place] woman status post right breast and axillary lymph node biopsy 08/30/2015, both positive for a clinical T1-2 N1, stage II invasive ductal carcinoma, grade 3, estrogen and progesterone receptor positive, HER-2/neu nonamplified, with an MIB-1 of 80%  (1) neoadjuvant chemotherapy started 09/16/2015 consisting of doxorubicin and cyclophosphamide in dose dense fashion 4, to be followed by paclitaxel weekly 12  (2) definitive surgery to follow with consideration of the Alliance trial  (3) adjuvant radiation to follow surgery  (4) adjuvant antiestrogen to begin at the completion of local therapy  PLAN: Michele Alvarez continues to manage treatment well with no major side effects. The labs were reviewed in detail and were stable. She has mild treatment related anemia with an hgb of 10.8. She will proceed with her 4th and final cycle of doxorubicin and cyclophosphamide as planned today.  Michele Alvarez will have a repeat echocardiogram and breast MRI on 4/19. She will return 2 days later to review the results with Dr. Jana Hakim. During this visit they will also discuss her next treatment regimen, paclitaxel weekly x12.  She understands and agrees with this plan. She knows the goal of treatment in her case is cure. She has been encouraged to call with any issues that might arise before her next visit here.  Laurie Panda, NP   10/28/2015 10:02 AM

## 2015-10-28 NOTE — Telephone Encounter (Signed)
appt made and pt will get sch printed in treatment room

## 2015-10-28 NOTE — Patient Instructions (Signed)
[facility] Discharge Instructions for Patients Receiving Chemotherapy  Today you received the following chemotherapy agents: Cytoxan, Adriamycin  To help prevent nausea and vomiting after your treatment, we encourage you to take your nausea medication as prescribed.   If you develop nausea and vomiting that is not controlled by your nausea medication, call the clinic.   BELOW ARE SYMPTOMS THAT SHOULD BE REPORTED IMMEDIATELY:  *FEVER GREATER THAN 100.5 F  *CHILLS WITH OR WITHOUT FEVER  NAUSEA AND VOMITING THAT IS NOT CONTROLLED WITH YOUR NAUSEA MEDICATION  *UNUSUAL SHORTNESS OF BREATH  *UNUSUAL BRUISING OR BLEEDING  TENDERNESS IN MOUTH AND THROAT WITH OR WITHOUT PRESENCE OF ULCERS  *URINARY PROBLEMS  *BOWEL PROBLEMS  UNUSUAL RASH Items with * indicate a potential emergency and should be followed up as soon as possible.  Feel free to call the clinic you have any questions or concerns. The clinic phone number is [phone].  Please show the CHEMO ALERT CARD at check-in to the Emergency Department and triage nurse.   

## 2015-10-28 NOTE — Progress Notes (Signed)
On 10/21/15 weight was entered  in  error of 95 lbs 1.6 ozs

## 2015-10-29 ENCOUNTER — Encounter (HOSPITAL_COMMUNITY): Payer: Self-pay | Admitting: Emergency Medicine

## 2015-10-29 ENCOUNTER — Emergency Department (HOSPITAL_COMMUNITY)
Admission: EM | Admit: 2015-10-29 | Discharge: 2015-10-29 | Disposition: A | Discharge status: Home / Self Care | Payer: 59 | Attending: Emergency Medicine | Admitting: Emergency Medicine

## 2015-10-29 DIAGNOSIS — Y658 Other specified misadventures during surgical and medical care: Secondary | ICD-10-CM | POA: Insufficient documentation

## 2015-10-29 DIAGNOSIS — Z8742 Personal history of other diseases of the female genital tract: Secondary | ICD-10-CM | POA: Insufficient documentation

## 2015-10-29 DIAGNOSIS — C50511 Malignant neoplasm of lower-outer quadrant of right female breast: Secondary | ICD-10-CM | POA: Diagnosis not present

## 2015-10-29 DIAGNOSIS — Z91040 Latex allergy status: Secondary | ICD-10-CM | POA: Insufficient documentation

## 2015-10-29 DIAGNOSIS — T85898A Other specified complication of other internal prosthetic devices, implants and grafts, initial encounter: Secondary | ICD-10-CM | POA: Diagnosis present

## 2015-10-29 DIAGNOSIS — Z79899 Other long term (current) drug therapy: Secondary | ICD-10-CM | POA: Diagnosis not present

## 2015-10-29 DIAGNOSIS — Y829 Unspecified medical devices associated with adverse incidents: Secondary | ICD-10-CM

## 2015-10-29 MED ORDER — PEGFILGRASTIM INJECTION 6 MG/0.6ML ~~LOC~~
6.0000 mg | PREFILLED_SYRINGE | Freq: Once | SUBCUTANEOUS | Status: AC
  Administered 2015-10-29: 6 mg via SUBCUTANEOUS
  Filled 2015-10-29: qty 0.6

## 2015-10-29 NOTE — Discharge Instructions (Signed)
Follow up with your Oncologist as scheduled.  Return if any problems.

## 2015-10-29 NOTE — ED Notes (Signed)
Pt complaint of neulasta onpro machine blinking red as if malfunctioning 1730 today; denies other complaint; last chemo yesterday.

## 2015-10-29 NOTE — ED Provider Notes (Signed)
CSN: [id number]     Arrival date & time 10/29/15  1838 History   First MD Initiated Contact with Patient 10/29/15 1852     Chief Complaint  Patient presents with  . Neulasta Onpro Problem      (Consider location/radiation/quality/duration/timing/severity/associated sxs/prior Treatment) HPI Comments: Pt is a 55 year old with a history of breast cancer currently on chemotherapy for breast cancer.  She is seeing Dr. Jana Hakim  In the oncology clinic.  Pt is on neulasta onpro.   Pt reports the device was suppose to inject neulasta at 5:30 today.   Pt reports the device beeped but then did not inject the medication.  (device is still reading full)  Pt denies any other complaints.  No sign pain.  No soreness  The history is provided by the patient. No language interpreter was used.    Past Medical History  Diagnosis Date  . Breast cancer of lower-outer quadrant of right female breast ([place]) 08/19/2015  . Hot flashes   . PONV (postoperative nausea and vomiting)    Past Surgical History  Procedure Laterality Date  . Neck surgery  2001  . Back surgery      x2, lumbar  . Portacath placement Right 09/09/2015    Procedure: INSERTION PORT-A-CATH WITH [place] ;  Surgeon: [name], MD;  Location: [facility];  Service: General;  Laterality: Right;   No family history on file. Social History  Substance Use Topics  . Smoking status: Never Smoker   . Smokeless tobacco: None  . Alcohol Use: Yes     Comment: social   OB History    No data available     Review of Systems  All other systems reviewed and are negative.     Allergies  Latex; Acyclovir and related; and Skin adhesives  Home Medications   Prior to Admission medications   Medication Sig Start Date End Date Taking? Authorizing Provider  dexamethasone (DECADRON) 4 MG tablet Take 2 tablets by mouth once a day on the day after chemotherapy and then take 2 tablets two times a day for 2 days. Take with food. 09/10/15   Yes Laurie Panda, NP  lidocaine-prilocaine (EMLA) cream Apply to affected area once 09/10/15  Yes Laurie Panda, NP  omeprazole (PRILOSEC) 40 MG capsule Take 1 capsule (40 mg total) by mouth daily. 09/23/15  Yes Chauncey Cruel, MD  prochlorperazine (COMPAZINE) 10 MG tablet Take 1 tablet (10 mg total) by mouth every 6 (six) hours as needed (Nausea or vomiting). 09/10/15  Yes Laurie Panda, NP  LORazepam (ATIVAN) 0.5 MG tablet Take 1 tablet (0.5 mg total) by mouth every 6 (six) hours as needed (Nausea or vomiting). Patient not taking: Reported on 10/14/2015 09/10/15   Laurie Panda, NP  ondansetron (ZOFRAN) 8 MG tablet Take 1 tablet (8 mg total) by mouth 2 (two) times daily as needed. Start on the third day after chemotherapy. Patient not taking: Reported on 10/29/2015 09/10/15   Genelle Gather Boelter, NP   BP 116/68 mmHg  Pulse 91  Temp(Src) 97.9 F (36.6 C) (Oral)  Resp 18  SpO2 99% Physical Exam  Constitutional: She is oriented to person, place, and time. She appears well-developed and well-nourished.  HENT:  Head: Normocephalic.  Eyes: EOM are normal.  Neck: Normal range of motion.  Cardiovascular: Normal rate.   Pulmonary/Chest: Effort normal.  Abdominal: She exhibits no distension.  Device inplace,  Read full,  Blinking red.    Musculoskeletal:  Normal range of motion.  Neurological: She is alert and oriented to person, place, and time.  Skin: Skin is warm.  Psychiatric: She has a normal mood and affect.  Nursing note and vitals reviewed.   ED Course  Procedures (including critical care time) Labs Review Labs Reviewed - No data to display  Imaging Review No results found. I have personally reviewed and evaluated these images and lab results as part of my medical decision-making.   EKG Interpretation None      MDM( ispoke to [employer].  He spoke to manufacturer.  Device removed and will be returned to credit pt.   Pt given neulasta injection here.  Pt is  advised to follow up with her oncologist.   Final diagnoses:  Medical device associated with adverse incidents    Meds ordered this encounter  Medications  . pegfilgrastim (NEULASTA) injection 6 mg    Sig:    An After Visit Summary was printed and given to the patient.   Hollace Kinnier [place], PA-C 10/29/15 2032  Harvel Quale, MD 10/31/15 [phone]

## 2015-11-03 ENCOUNTER — Ambulatory Visit (HOSPITAL_COMMUNITY)
Admission: RE | Admit: 2015-11-03 | Discharge: 2015-11-03 | Disposition: A | Discharge status: Home / Self Care | Payer: 59 | Source: Ambulatory Visit | Attending: Family Medicine | Admitting: Family Medicine

## 2015-11-03 ENCOUNTER — Other Ambulatory Visit (HOSPITAL_COMMUNITY): Payer: Self-pay | Admitting: *Deleted

## 2015-11-03 ENCOUNTER — Ambulatory Visit
Admission: RE | Admit: 2015-11-03 | Discharge: 2015-11-03 | Disposition: A | Discharge status: Home / Self Care | Payer: 59 | Source: Ambulatory Visit | Attending: Nurse Practitioner | Admitting: Nurse Practitioner

## 2015-11-03 ENCOUNTER — Ambulatory Visit (HOSPITAL_BASED_OUTPATIENT_CLINIC_OR_DEPARTMENT_OTHER)
Admission: RE | Admit: 2015-11-03 | Discharge: 2015-11-03 | Disposition: A | Discharge status: Home / Self Care | Payer: 59 | Source: Ambulatory Visit | Attending: Cardiology | Admitting: Cardiology

## 2015-11-03 VITALS — BP 104/56 | HR 87 | Wt 165.0 lb

## 2015-11-03 DIAGNOSIS — C50511 Malignant neoplasm of lower-outer quadrant of right female breast: Secondary | ICD-10-CM | POA: Diagnosis not present

## 2015-11-03 DIAGNOSIS — I340 Nonrheumatic mitral (valve) insufficiency: Secondary | ICD-10-CM | POA: Insufficient documentation

## 2015-11-03 DIAGNOSIS — I313 Pericardial effusion (noninflammatory): Secondary | ICD-10-CM | POA: Diagnosis not present

## 2015-11-03 DIAGNOSIS — Z09 Encounter for follow-up examination after completed treatment for conditions other than malignant neoplasm: Secondary | ICD-10-CM | POA: Diagnosis present

## 2015-11-03 MED ORDER — GADOBENATE DIMEGLUMINE 529 MG/ML IV SOLN
15.0000 mL | Freq: Once | INTRAVENOUS | Status: AC | PRN
  Administered 2015-11-03: 15 mL via INTRAVENOUS

## 2015-11-03 NOTE — Progress Notes (Signed)
  Echocardiogram 2D Echocardiogram has been performed.  Michele Alvarez M 11/03/2015, 9:57 AM

## 2015-11-03 NOTE — Progress Notes (Signed)
Patient ID: Michele Alvarez, female   DOB: Aug 05, 1960, 55 y.o.   MRN: [id number] Oncologist: Dr. Jana Hakim  55 yo with minimal past medical history was diagnosed with breast cancer in 2/17.  ER+/PR+/HER2-.  Plan for neoadjuvant chemo with doxorubicin/cyclophosphamide x 4 cycles then paclitaxel weekly x 12 cycles.  This will be followed by definitive surgery and radiation.  She has completed doxorubicin + cyclophosphamide.   She does not have any exertional dyspnea or chest pain. No palpitations.    I reviewed the echo done today: no significant change compared to prior.   PMH: 1. Back pain. 2. Breast cancer: Diagnosed 2/17.  ER+/PR+/HER2-.  Plan for neoadjuvant chemo with doxorubicin/cyclophosphamide x 4 cycles then paclitaxel weekly x 12 cycles.  This will be followed by definitive surgery and radiation.  - Echo (3/17) with EF 55-60%, lateral s' 9.6, GLS -91.6%, normal diastolic function, normal RV size and systolic function.  - Echo (4/17) with EF 38-46%, normal diastolic function, lateral s' 13.3, GLS -19.7%, normal RV size and systolic function, mild MR.   SH: Lives in [place], nonsmoker.    FH: No family history of heart problems.   ROS: All systems reviewed and negative except as per HPI.   Current Outpatient Prescriptions  Medication Sig Dispense Refill  . dexamethasone (DECADRON) 4 MG tablet Take 2 tablets by mouth once a day on the day after chemotherapy and then take 2 tablets two times a day for 2 days. Take with food. 30 tablet 1  . lidocaine-prilocaine (EMLA) cream Apply to affected area once 30 g 3  . magnesium hydroxide (MILK OF MAGNESIA) 400 MG/5ML suspension Take 15 mLs by mouth daily as needed for mild constipation.    Marland Kitchen omeprazole (PRILOSEC) 40 MG capsule Take 1 capsule (40 mg total) by mouth daily. 60 capsule 3  . LORazepam (ATIVAN) 0.5 MG tablet Take 1 tablet (0.5 mg total) by mouth every 6 (six) hours as needed (Nausea or vomiting). (Patient not taking: Reported  on 10/14/2015) 30 tablet 0  . ondansetron (ZOFRAN) 8 MG tablet Take 1 tablet (8 mg total) by mouth 2 (two) times daily as needed. Start on the third day after chemotherapy. (Patient not taking: Reported on 10/29/2015) 30 tablet 1  . prochlorperazine (COMPAZINE) 10 MG tablet Take 1 tablet (10 mg total) by mouth every 6 (six) hours as needed (Nausea or vomiting). (Patient not taking: Reported on 11/03/2015) 30 tablet 1   No current facility-administered medications for this encounter.   BP 104/56 mmHg  Pulse 87  Wt 165 lb (74.844 kg)  SpO2 98% General: NAD Neck: No JVD, no thyromegaly or thyroid nodule.  Lungs: Clear to auscultation bilaterally with normal respiratory effort. CV: Nondisplaced PMI.  Heart regular S1/S2, no S3/S4, no murmur.  No peripheral edema.  No carotid bruit.  Normal pedal pulses.  Abdomen: Soft, nontender, no hepatosplenomegaly, no distention.  Skin: Intact without lesions or rashes.  Neurologic: Alert and oriented x 3.  Psych: Normal affect. Extremities: No clubbing or cyanosis.  HEENT: Normal.   Assessment/Plan: Ms Marinello has completed 4 cycles of doxorubicin/cyclophosphamide.  Echo was done today and reviewed: LV function remains stable.  I will have her get another echo in 6 months to assess for late effects of doxorubicin.   Loralie Champagne 11/03/2015

## 2015-11-03 NOTE — Patient Instructions (Signed)
We will contact you in 6 months to schedule your next appointment and echocardiogram  

## 2015-11-04 ENCOUNTER — Ambulatory Visit: Payer: 59 | Admitting: Nurse Practitioner

## 2015-11-04 ENCOUNTER — Other Ambulatory Visit: Payer: 59

## 2015-11-05 ENCOUNTER — Other Ambulatory Visit (HOSPITAL_BASED_OUTPATIENT_CLINIC_OR_DEPARTMENT_OTHER): Payer: 59

## 2015-11-05 ENCOUNTER — Ambulatory Visit (HOSPITAL_BASED_OUTPATIENT_CLINIC_OR_DEPARTMENT_OTHER): Payer: 59 | Admitting: Oncology

## 2015-11-05 VITALS — BP 115/65 | HR 101 | Temp 97.6°F | Resp 18 | Ht 68.0 in | Wt 163.9 lb

## 2015-11-05 DIAGNOSIS — C773 Secondary and unspecified malignant neoplasm of axilla and upper limb lymph nodes: Secondary | ICD-10-CM | POA: Diagnosis not present

## 2015-11-05 DIAGNOSIS — C50511 Malignant neoplasm of lower-outer quadrant of right female breast: Secondary | ICD-10-CM

## 2015-11-05 DIAGNOSIS — C50811 Malignant neoplasm of overlapping sites of right female breast: Secondary | ICD-10-CM

## 2015-11-05 LAB — COMPREHENSIVE METABOLIC PANEL
ALBUMIN: 3.7 g/dL (ref 3.5–5.0)
ALK PHOS: 101 U/L (ref 40–150)
ALT: 14 U/L (ref 0–55)
AST: 13 U/L (ref 5–34)
Anion Gap: 8 mEq/L (ref 3–11)
BUN: 17.6 mg/dL (ref 7.0–26.0)
CALCIUM: 9.3 mg/dL (ref 8.4–10.4)
CO2: 29 mEq/L (ref 22–29)
Chloride: 105 mEq/L (ref 98–109)
Creatinine: 0.8 mg/dL (ref 0.6–1.1)
EGFR: 84 mL/min/{1.73_m2} — AB (ref >90)
GLUCOSE: 114 mg/dL (ref 70–140)
POTASSIUM: 4.2 meq/L (ref 3.5–5.1)
Sodium: 141 mEq/L (ref 136–145)
TOTAL PROTEIN: 6.1 g/dL — AB (ref 6.4–8.3)

## 2015-11-05 LAB — CBC WITH DIFFERENTIAL/PLATELET
BASO%: 0.3 % (ref 0.0–2.0)
BASOS ABS: 0 10*3/uL (ref 0.0–0.1)
EOS ABS: 0 10*3/uL (ref 0.0–0.5)
EOS%: 0.3 % (ref 0.0–7.0)
HEMATOCRIT: 30.5 % — AB (ref 34.8–46.6)
HEMOGLOBIN: 9.8 g/dL — AB (ref 11.6–15.9)
LYMPH#: 0.3 10*3/uL — AB (ref 0.9–3.3)
LYMPH%: 13 % — ABNORMAL LOW (ref 14.0–49.7)
MCH: 30.3 pg (ref 25.1–34.0)
MCHC: 32.2 g/dL (ref 31.5–36.0)
MCV: 94.2 fL (ref 79.5–101.0)
MONO#: 0.3 10*3/uL (ref 0.1–0.9)
MONO%: 10.9 % (ref 0.0–14.0)
NEUT%: 75.5 % (ref 38.4–76.8)
NEUTROS ABS: 1.8 10*3/uL (ref 1.5–6.5)
PLATELETS: 144 10*3/uL — AB (ref 145–400)
RBC: 3.24 10*6/uL — ABNORMAL LOW (ref 3.70–5.45)
RDW: 15.1 % — AB (ref 11.2–14.5)
WBC: 2.4 10*3/uL — AB (ref 3.9–10.3)
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
## 2015-11-09 ENCOUNTER — Telehealth: Payer: Self-pay | Admitting: *Deleted

## 2015-11-09 ENCOUNTER — Other Ambulatory Visit: Payer: Self-pay | Admitting: Nurse Practitioner

## 2015-11-09 DIAGNOSIS — J019 Acute sinusitis, unspecified: Secondary | ICD-10-CM

## 2015-11-09 MED ORDER — AMOXICILLIN-POT CLAVULANATE 875-125 MG PO TABS
1.0000 | ORAL_TABLET | Freq: Two times a day (BID) | ORAL | Status: DC
Start: 2015-11-09 — End: 2015-11-19

## 2015-11-09 NOTE — Telephone Encounter (Signed)
Received call from patient stating she has been sick since Friday.  She is now at her PCP's office. They have tested for the flu and strep throat which is negative.  She stated that the MD gave her tamiflu and state he was more concerned about her cough. She states she is having some green sputum and nasal drainage.  Per Nira Conn do not take Tamiflu and use Mucinex and Robitussin.  Also will call her in some antibiotics.  Instructed her to call if she does not feel any better in couple of days.

## 2015-11-10 ENCOUNTER — Ambulatory Visit: Payer: 59 | Admitting: Nurse Practitioner

## 2015-11-10 ENCOUNTER — Ambulatory Visit: Payer: 59

## 2015-11-10 ENCOUNTER — Other Ambulatory Visit: Payer: 59

## 2015-11-17 ENCOUNTER — Telehealth: Payer: Self-pay | Admitting: *Deleted

## 2015-11-17 NOTE — Telephone Encounter (Signed)
Received call from patient stating she is feeling much better but still has a cough and some congestion.  Informed her to keep appointment for Friday 5/5.  She verbalized understanding.

## 2015-11-19 ENCOUNTER — Ambulatory Visit (HOSPITAL_BASED_OUTPATIENT_CLINIC_OR_DEPARTMENT_OTHER): Payer: 59 | Admitting: Nurse Practitioner

## 2015-11-19 ENCOUNTER — Encounter: Payer: Self-pay | Admitting: Nurse Practitioner

## 2015-11-19 ENCOUNTER — Other Ambulatory Visit (HOSPITAL_BASED_OUTPATIENT_CLINIC_OR_DEPARTMENT_OTHER): Payer: 59

## 2015-11-19 ENCOUNTER — Ambulatory Visit (HOSPITAL_BASED_OUTPATIENT_CLINIC_OR_DEPARTMENT_OTHER): Payer: 59

## 2015-11-19 ENCOUNTER — Other Ambulatory Visit: Payer: Self-pay | Admitting: Oncology

## 2015-11-19 VITALS — BP 106/75 | HR 89 | Temp 98.2°F | Resp 16

## 2015-11-19 VITALS — BP 109/67 | HR 103 | Temp 97.8°F | Resp 18 | Ht 68.0 in | Wt 163.7 lb

## 2015-11-19 DIAGNOSIS — C773 Secondary and unspecified malignant neoplasm of axilla and upper limb lymph nodes: Secondary | ICD-10-CM

## 2015-11-19 DIAGNOSIS — C50511 Malignant neoplasm of lower-outer quadrant of right female breast: Secondary | ICD-10-CM

## 2015-11-19 DIAGNOSIS — C50811 Malignant neoplasm of overlapping sites of right female breast: Secondary | ICD-10-CM

## 2015-11-19 DIAGNOSIS — R05 Cough: Secondary | ICD-10-CM | POA: Diagnosis not present

## 2015-11-19 DIAGNOSIS — Z170 Estrogen receptor positive status [ER+]: Secondary | ICD-10-CM

## 2015-11-19 DIAGNOSIS — Z5111 Encounter for antineoplastic chemotherapy: Secondary | ICD-10-CM | POA: Diagnosis not present

## 2015-11-19 DIAGNOSIS — J019 Acute sinusitis, unspecified: Secondary | ICD-10-CM

## 2015-11-19 LAB — COMPREHENSIVE METABOLIC PANEL
ALBUMIN: 3.6 g/dL (ref 3.5–5.0)
ALK PHOS: 87 U/L (ref 40–150)
ALT: 74 U/L — ABNORMAL HIGH (ref 0–55)
ANION GAP: 9 meq/L (ref 3–11)
AST: 52 U/L — ABNORMAL HIGH (ref 5–34)
BUN: 12.4 mg/dL (ref 7.0–26.0)
CALCIUM: 9.6 mg/dL (ref 8.4–10.4)
CHLORIDE: 105 meq/L (ref 98–109)
CO2: 28 mEq/L (ref 22–29)
Creatinine: 0.8 mg/dL (ref 0.6–1.1)
EGFR: 85 mL/min/{1.73_m2} — ABNORMAL LOW (ref >90)
Glucose: 108 mg/dl (ref 70–140)
POTASSIUM: 4.3 meq/L (ref 3.5–5.1)
Sodium: 142 mEq/L (ref 136–145)
Total Bilirubin: 0.41 mg/dL (ref 0.20–1.20)
Total Protein: 6.4 g/dL (ref 6.4–8.3)

## 2015-11-19 LAB — CBC WITH DIFFERENTIAL/PLATELET
BASO%: 0.4 % (ref 0.0–2.0)
BASOS ABS: 0 10*3/uL (ref 0.0–0.1)
EOS ABS: 0 10*3/uL (ref 0.0–0.5)
EOS%: 0.2 % (ref 0.0–7.0)
HEMATOCRIT: 33.4 % — AB (ref 34.8–46.6)
HEMOGLOBIN: 11 g/dL — AB (ref 11.6–15.9)
LYMPH#: 0.7 10*3/uL — AB (ref 0.9–3.3)
LYMPH%: 8.7 % — ABNORMAL LOW (ref 14.0–49.7)
MCH: 31.2 pg (ref 25.1–34.0)
MCHC: 33.1 g/dL (ref 31.5–36.0)
MCV: 94.3 fL (ref 79.5–101.0)
MONO#: 1.2 10*3/uL — ABNORMAL HIGH (ref 0.1–0.9)
MONO%: 15.4 % — ABNORMAL HIGH (ref 0.0–14.0)
NEUT#: 5.7 10*3/uL (ref 1.5–6.5)
NEUT%: 75.3 % (ref 38.4–76.8)
PLATELETS: 351 10*3/uL (ref 145–400)
RBC: 3.54 10*6/uL — ABNORMAL LOW (ref 3.70–5.45)
RDW: 16.9 % — AB (ref 11.2–14.5)
WBC: 7.6 10*3/uL (ref 3.9–10.3)

## 2015-11-19 MED ORDER — HEPARIN SOD (PORK) LOCK FLUSH 100 UNIT/ML IV SOLN
500.0000 [IU] | Freq: Once | INTRAVENOUS | Status: AC | PRN
  Administered 2015-11-19: 500 [IU]
  Filled 2015-11-19: qty 5

## 2015-11-19 MED ORDER — DIPHENHYDRAMINE HCL 50 MG/ML IJ SOLN
INTRAMUSCULAR | Status: AC
  Filled 2015-11-19: qty 1

## 2015-11-19 MED ORDER — FAMOTIDINE IN NACL 20-0.9 MG/50ML-% IV SOLN
INTRAVENOUS | Status: AC
  Filled 2015-11-19: qty 50

## 2015-11-19 MED ORDER — SODIUM CHLORIDE 0.9% FLUSH
10.0000 mL | INTRAVENOUS | Status: DC | PRN
  Administered 2015-11-19: 10 mL
  Filled 2015-11-19: qty 10

## 2015-11-19 MED ORDER — SODIUM CHLORIDE 0.9 % IV SOLN
80.0000 mg/m2 | Freq: Once | INTRAVENOUS | Status: AC
  Administered 2015-11-19: 150 mg via INTRAVENOUS
  Filled 2015-11-19: qty 25

## 2015-11-19 MED ORDER — DIPHENHYDRAMINE HCL 50 MG/ML IJ SOLN
25.0000 mg | Freq: Once | INTRAMUSCULAR | Status: AC
  Administered 2015-11-19: 25 mg via INTRAVENOUS

## 2015-11-19 MED ORDER — SODIUM CHLORIDE 0.9 % IV SOLN
4.0000 mg | Freq: Once | INTRAVENOUS | Status: AC
  Administered 2015-11-19: 4 mg via INTRAVENOUS
  Filled 2015-11-19: qty 0.4

## 2015-11-19 MED ORDER — FAMOTIDINE IN NACL 20-0.9 MG/50ML-% IV SOLN
20.0000 mg | Freq: Once | INTRAVENOUS | Status: AC
  Administered 2015-11-19: 20 mg via INTRAVENOUS

## 2015-11-19 MED ORDER — SODIUM CHLORIDE 0.9 % IV SOLN
Freq: Once | INTRAVENOUS | Status: AC
  Administered 2015-11-19: 10:00:00 via INTRAVENOUS

## 2015-11-19 NOTE — Patient Instructions (Addendum)
[facility] Discharge Instructions for Patients Receiving Chemotherapy  Today you received the following chemotherapy agents: Taxol.  To help prevent nausea and vomiting after your treatment, we encourage you to take your nausea medication: Zofran 8 mg every 12 hours as needed.   If you develop nausea and vomiting that is not controlled by your nausea medication, call the clinic.   BELOW ARE SYMPTOMS THAT SHOULD BE REPORTED IMMEDIATELY:  *FEVER GREATER THAN 100.5 F  *CHILLS WITH OR WITHOUT FEVER  NAUSEA AND VOMITING THAT IS NOT CONTROLLED WITH YOUR NAUSEA MEDICATION  *UNUSUAL SHORTNESS OF BREATH  *UNUSUAL BRUISING OR BLEEDING  TENDERNESS IN MOUTH AND THROAT WITH OR WITHOUT PRESENCE OF ULCERS  *URINARY PROBLEMS  *BOWEL PROBLEMS  UNUSUAL RASH Items with * indicate a potential emergency and should be followed up as soon as possible.  Feel free to call the clinic you have any questions or concerns. The clinic phone number is [phone].  Please show the [facility] at check-in to the Emergency Department and triage nurse.  Paclitaxel injection What is this medicine? PACLITAXEL (PAK li TAX el) is a chemotherapy drug. It targets fast dividing cells, like cancer cells, and causes these cells to die. This medicine is used to treat ovarian cancer, breast cancer, and other cancers. This medicine may be used for other purposes; ask your health care provider or pharmacist if you have questions. What should I tell my health care provider before I take this medicine? They need to know if you have any of these conditions: -blood disorders -irregular heartbeat -infection (especially a virus infection such as chickenpox, cold sores, or herpes) -liver disease -previous or ongoing radiation therapy -an unusual or allergic reaction to paclitaxel, alcohol, polyoxyethylated castor oil, other chemotherapy agents, other medicines, foods, dyes, or preservatives -pregnant  or trying to get pregnant -breast-feeding How should I use this medicine? This drug is given as an infusion into a vein. It is administered in a hospital or clinic by a specially trained health care professional. Talk to your pediatrician regarding the use of this medicine in children. Special care may be needed. Overdosage: If you think you have taken too much of this medicine contact a poison control center or emergency room at once. NOTE: This medicine is only for you. Do not share this medicine with others. What if I miss a dose? It is important not to miss your dose. Call your doctor or health care professional if you are unable to keep an appointment. What may interact with this medicine? Do not take this medicine with any of the following medications: -disulfiram -metronidazole This medicine may also interact with the following medications: -cyclosporine -diazepam -ketoconazole -medicines to increase blood counts like filgrastim, pegfilgrastim, sargramostim -other chemotherapy drugs like cisplatin, doxorubicin, epirubicin, etoposide, teniposide, vincristine -quinidine -testosterone -vaccines -verapamil Talk to your doctor or health care professional before taking any of these medicines: -acetaminophen -aspirin -ibuprofen -ketoprofen -naproxen This list may not describe all possible interactions. Give your health care provider a list of all the medicines, herbs, non-prescription drugs, or dietary supplements you use. Also tell them if you smoke, drink alcohol, or use illegal drugs. Some items may interact with your medicine. What should I watch for while using this medicine? Your condition will be monitored carefully while you are receiving this medicine. You will need important blood work done while you are taking this medicine. This drug may make you feel generally unwell. This is not uncommon, as chemotherapy can affect  healthy cells as well as cancer cells. Report any side  effects. Continue your course of treatment even though you feel ill unless your doctor tells you to stop. This medicine can cause serious allergic reactions. To reduce your risk you will need to take other medicine(s) before treatment with this medicine. In some cases, you may be given additional medicines to help with side effects. Follow all directions for their use. Call your doctor or health care professional for advice if you get a fever, chills or sore throat, or other symptoms of a cold or flu. Do not treat yourself. This drug decreases your body's ability to fight infections. Try to avoid being around people who are sick. This medicine may increase your risk to bruise or bleed. Call your doctor or health care professional if you notice any unusual bleeding. Be careful brushing and flossing your teeth or using a toothpick because you may get an infection or bleed more easily. If you have any dental work done, tell your dentist you are receiving this medicine. Avoid taking products that contain aspirin, acetaminophen, ibuprofen, naproxen, or ketoprofen unless instructed by your doctor. These medicines may hide a fever. Do not become pregnant while taking this medicine. Women should inform their doctor if they wish to become pregnant or think they might be pregnant. There is a potential for serious side effects to an unborn child. Talk to your health care professional or pharmacist for more information. Do not breast-feed an infant while taking this medicine. Men are advised not to father a child while receiving this medicine. This product may contain alcohol. Ask your pharmacist or healthcare provider if this medicine contains alcohol. Be sure to tell all healthcare providers you are taking this medicine. Certain medicines, like metronidazole and disulfiram, can cause an unpleasant reaction when taken with alcohol. The reaction includes flushing, headache, nausea, vomiting, sweating, and increased  thirst. The reaction can last from 30 minutes to several hours. What side effects may I notice from receiving this medicine? Side effects that you should report to your doctor or health care professional as soon as possible: -allergic reactions like skin rash, itching or hives, swelling of the face, lips, or tongue -low blood counts - This drug may decrease the number of white blood cells, red blood cells and platelets. You may be at increased risk for infections and bleeding. -signs of infection - fever or chills, cough, sore throat, pain or difficulty passing urine -signs of decreased platelets or bleeding - bruising, pinpoint red spots on the skin, black, tarry stools, nosebleeds -signs of decreased red blood cells - unusually weak or tired, fainting spells, lightheadedness -breathing problems -chest pain -high or low blood pressure -mouth sores -nausea and vomiting -pain, swelling, redness or irritation at the injection site -pain, tingling, numbness in the hands or feet -slow or irregular heartbeat -swelling of the ankle, feet, hands Side effects that usually do not require medical attention (report to your doctor or health care professional if they continue or are bothersome): -bone pain -complete hair loss including hair on your head, underarms, pubic hair, eyebrows, and eyelashes -changes in the color of fingernails -diarrhea -loosening of the fingernails -loss of appetite -muscle or joint pain -red flush to skin -sweating This list may not describe all possible side effects. Call your doctor for medical advice about side effects. You may report side effects to FDA at 1-800-FDA-1088. Where should I keep my medicine? This drug is given in a hospital or clinic and will  not be stored at home. NOTE: This sheet is a summary. It may not cover all possible information. If you have questions about this medicine, talk to your doctor, pharmacist, or health care provider.    2016,  Elsevier/Gold Standard. (2015-02-18 13:02:56)

## 2015-11-19 NOTE — Progress Notes (Signed)
[facility]  [phone] [phone]) [phone]     ID: Michele Alvarez DOB: 11-12-1960  MR#: [id number]  TOI#:[id number]  Patient Care Team: Jonathon Jordan, MD as PCP - General (Family Medicine) Avon Gully, NP as Nurse Practitioner (Obstetrics and Gynecology) [name], MD as Consulting Physician (General Surgery) Chauncey Cruel, MD as Consulting Physician (Oncology) Eppie Gibson, MD as Attending Physician (Radiation Oncology) Sylvan Cheese, NP as Nurse Practitioner (Hematology and Oncology) Jovita Gamma, MD as Consulting Physician (Neurosurgery) PCP: Lilian Coma, MD OTHER MD:  CHIEF COMPLAINT: Node-positive breast cancer  CURRENT TREATMENT: Neoadjuvant chemotherapy  BREAST CANCER HISTORY: From the original intake note:  "Michele Alvarez" had routine screening mammography at [facility] 08/07/2015. There was an area of calcification in the right breast 2:00 position. There was also an area of calcification in the left breast 7:00 position. She was recalled for bilateral diagnostic mammography 08/12/2015. The left breast calcifications were felt to be likely benign and repeat mammography in 6 months was suggested.  On the right, breast density was category D. There was a 1.2 cm irregular mass at the 6:00, inframammary position and also suspicious calcifications. Adding those together the area measured 2.5 cm. Ultrasound of the right breast on the same day found a 1.3 cm lobulated mass in the right breast at the 6:00 position. There was a 1.7 cm axillary lymph node with a fatty hilum.   On 08/30/2015 the patient underwent biopsy of the breast mass, the abnormal lymph node, and the area of calcifications. The calcifications ([id number]) were associated with ductal carcinoma in situ, with the prognostic panel pending. 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 The lymph node also was positive.  Note that the clip to the patient's breast mass did not deploy.  The patient's subsequent history is as detailed below  INTERVAL HISTORY: Michele Alvarez returns for follow up of her breast cancer, accompanied by her partner, Michele Alvarez. Today she is due for cycle 1 of 12 planned weekly doses of paclitaxel. The start of this new regimen was delayed by 1 week because of a severe sinus infection with headaches, green sputum, cough, and nasal congestion. We started her on augmentin and she has just finished her last dose yesterday. She was treating with sudafed for her residual symptoms but has recently switched to mucinex, which has been working better. She is still coughing and it makes her chest and rib cage hurt.   REVIEW OF SYSTEMS: A detailed review of systems is otherwise entirely stable, except where noted above.   PAST MEDICAL HISTORY: Past Medical History  Diagnosis Date  . Breast cancer of lower-outer quadrant of right female breast ([place]) 08/19/2015  . Hot flashes   . PONV (postoperative nausea and vomiting)     PAST SURGICAL HISTORY: Past Surgical History  Procedure Laterality Date  . Neck surgery  October 04, 1999  . Back surgery      x2, lumbar  . Portacath placement Right 09/09/2015    Procedure: INSERTION PORT-A-CATH WITH [place] ;  Surgeon: [name], MD;  Location: [facility];  Service: General;  Laterality: Right;    FAMILY HISTORY No family history on file. The patient's father died in 04-Oct-2014 at the age of 71. He had significant COPD. The patient's mother died at the age of 48 from Parkinson's disease. The patient has 1 brother, 1 sister. There is no cancer history  in the family to her knowledge.  GYNECOLOGIC HISTORY:  No LMP recorded. Patient is postmenopausal. Menarche age 55. She has GI asked P0. She stopped having periods in January 2014. She never took hormone replacement or oral  contraceptives.  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 She lives with her partner, [name], and 2 cats.     ADVANCED DIRECTIVES: Not in place. At the 09/01/2015 clinic visit the patient was given the appropriate forms to complete and notarize at her discretion.   HEALTH MAINTENANCE: Social History  Substance Use Topics  . Smoking status: Never Smoker   . Smokeless tobacco: Not on file  . Alcohol Use: Yes     Comment: social     Colonoscopy: Never   PAP:  Bone density: Never  Lipid panel:  Allergies  Allergen Reactions  . Latex Other (See Comments)    Fever blisters  . Acyclovir And Related   . Skin Adhesives Dermatitis    Reaction to dermabond, do not use    Current Outpatient Prescriptions  Medication Sig Dispense Refill  . lidocaine-prilocaine (EMLA) cream Apply to affected area once 30 g 3  . omeprazole (PRILOSEC) 40 MG capsule Take 1 capsule (40 mg total) by mouth daily. 60 capsule 3  . LORazepam (ATIVAN) 0.5 MG tablet Take 1 tablet (0.5 mg total) by mouth every 6 (six) hours as needed (Nausea or vomiting). (Patient not taking: Reported on 10/14/2015) 30 tablet 0  . magnesium hydroxide (MILK OF MAGNESIA) 400 MG/5ML suspension Take 15 mLs by mouth daily as needed for mild constipation. Reported on 11/19/2015    . ondansetron (ZOFRAN) 8 MG tablet Take 1 tablet (8 mg total) by mouth 2 (two) times daily as needed. Start on the third day after chemotherapy. (Patient not taking: Reported on 10/29/2015) 30 tablet 1   No current facility-administered medications for this visit.    OBJECTIVE: Middle-aged white woman  Who appears stated age 55 Vitals:   11/19/15 0829  BP: 109/67  Pulse: 103  Temp: 97.8 F (36.6 C)  Resp: 18     Body mass index is 24.9 kg/(m^2).    ECOG FS:1 - Symptomatic but completely ambulatory  Skin: warm, dry  HEENT: sclerae anicteric, conjunctivae pink, oropharynx clear. No thrush or mucositis.  Lymph Nodes: No cervical or  supraclavicular lymphadenopathy  Lungs: clear to auscultation bilaterally, no rales, wheezes, or rhonci  Heart: regular rate and rhythm  Abdomen: round, soft, non tender, positive bowel sounds  Musculoskeletal: No focal spinal tenderness, no peripheral edema  Neuro: non focal, well oriented, positive affect  Breasts: deferred  LAB RESULTS:  CMP     Component Value Date/Time   NA 141 11/05/2015 1405   K 4.2 11/05/2015 1405   CO2 29 11/05/2015 1405   GLUCOSE 114 11/05/2015 1405   BUN 17.6 11/05/2015 1405   CREATININE 0.8 11/05/2015 1405   CALCIUM 9.3 11/05/2015 1405   PROT 6.1* 11/05/2015 1405   ALBUMIN 3.7 11/05/2015 1405   AST 13 11/05/2015 1405   ALT 14 11/05/2015 1405   ALKPHOS 101 11/05/2015 1405   BILITOT <0.30 11/05/2015 1405    INo results found for: SPEP, UPEP  Lab Results  Component Value Date   WBC 7.6 11/19/2015   NEUTROABS 5.7 11/19/2015   HGB 11.0* 11/19/2015   HCT 33.4* 11/19/2015   MCV 94.3 11/19/2015   PLT 351 11/19/2015      Chemistry      Component Value Date/Time   NA 141  11/05/2015 1405   K 4.2 11/05/2015 1405   CO2 29 11/05/2015 1405   BUN 17.6 11/05/2015 1405   CREATININE 0.8 11/05/2015 1405      Component Value Date/Time   CALCIUM 9.3 11/05/2015 1405   ALKPHOS 101 11/05/2015 1405   AST 13 11/05/2015 1405   ALT 14 11/05/2015 1405   BILITOT <0.30 11/05/2015 1405       No results found for: LABCA2  No components found for: LABCA125  No results for input(s): INR in the last 168 hours.  Urinalysis No results found for: COLORURINE, APPEARANCEUR, LABSPEC, [place], GLUCOSEU, HGBUR, BILIRUBINUR, KETONESUR, PROTEINUR, UROBILINOGEN, NITRITE, LEUKOCYTESUR  ELIGIBLE FOR AVAILABLE RESEARCH PROTOCOL: Alliance, PREVENT  STUDIES: Mr Breast Bilateral W Wo Contrast  11/03/2015  CLINICAL DATA:  Recently diagnosed right breast cancer. Patient is at the mid point of chemotherapy, assess for chemotherapy response. LABS:  Does not apply EXAM:  BILATERAL BREAST MRI WITH AND WITHOUT CONTRAST TECHNIQUE: Multiplanar, multisequence MR images of both breasts were obtained prior to and following the intravenous administration of 15 ml of MultiHance. THREE-DIMENSIONAL MR IMAGE RENDERING ON INDEPENDENT WORKSTATION: Three-dimensional MR images were rendered by post-processing of the original MR data on an independent workstation. The three-dimensional MR images were interpreted, and findings are reported in the following complete MRI report for this study. Three dimensional images were evaluated at the independent DynaCad workstation COMPARISON:  Previous exam(s). FINDINGS: Breast composition: d. Extreme fibroglandular tissue. Background parenchymal enhancement: Mild Right breast: The previously noted enhancing mass is significantly smaller currently measuring 9 x 7 x 4 mm with plateau enhancement kinetics, posterior depth 6 o'clock position. Left breast: No mass or abnormal enhancement. Lymph nodes: The previously described enlarged lymph nodes in the right axilla are normal size. No abnormal appearing lymph nodes. Ancillary findings:  None. IMPRESSION: Known right breast cancer, mass is significantly smaller compared to prior exam. Normal size right axillary lymph nodes on the current exam. RECOMMENDATION: Treatment plan BI-RADS CATEGORY  6: Known biopsy-proven malignancy. Electronically Signed   By: Abelardo Diesel M.D.   On: 11/03/2015 16:37     ASSESSMENT: 55 y.o. [place] woman status post right breast and axillary lymph node biopsy 08/30/2015, both positive for a clinical T1-2 N1, stage II invasive ductal carcinoma, grade 3, estrogen and progesterone receptor positive, HER-2/neu nonamplified, with an MIB-1 of 80%  (1) neoadjuvant chemotherapy started 09/16/2015 consisting of doxorubicin and cyclophosphamide in dose dense fashion 4,  to be followed by paclitaxel weekly 12  Starting 11/10/2015  (2) definitive surgery to follow with consideration of  the Alliance trial  (3) adjuvant radiation to follow surgery  (4) adjuvant antiestrogen to begin at the completion of local therapy  PLAN: Michele Alvarez is still recovering from her sinus infection, but she feels well enough to proceed with her first dose of weekly paclitaxel. The labs were reviewed in detail and her CBC is improved in all measures. All questions and concerns regarding this new drug were answered.   She will continue on mucinex-DM BID for the next few days with robitussin PRN.   Michele Alvarez will return in 1 week for cycle 2 of treatment. She understands and agrees with this plan. She knows the goal of treatment in her case is cure. She has been encouraged to call with any issues that might arise before her next visit here.   Laurie Panda, NP   11/19/2015 9:01 AM

## 2015-11-22 ENCOUNTER — Telehealth: Payer: Self-pay

## 2015-11-22 NOTE — Telephone Encounter (Signed)
[place] for chemotherapy F/U.  Left message for pt to call if they have any questions or concerns or any new symptoms to report, along with the phone number.

## 2015-11-22 NOTE — Telephone Encounter (Signed)
-----   Message from Otila Kluver, RN sent at 11/19/2015  1:13 PM EDT ----- Regarding: [place] 1st time Taxol Contact: [phone] 1st time Taxol. Tolerated well. No evidence of reaction during treatment time.   Drucie Ip, RN

## 2015-11-26 ENCOUNTER — Encounter: Payer: Self-pay | Admitting: *Deleted

## 2015-11-26 ENCOUNTER — Ambulatory Visit (HOSPITAL_BASED_OUTPATIENT_CLINIC_OR_DEPARTMENT_OTHER): Payer: 59 | Admitting: Nurse Practitioner

## 2015-11-26 ENCOUNTER — Other Ambulatory Visit (HOSPITAL_BASED_OUTPATIENT_CLINIC_OR_DEPARTMENT_OTHER): Payer: 59

## 2015-11-26 ENCOUNTER — Ambulatory Visit (HOSPITAL_BASED_OUTPATIENT_CLINIC_OR_DEPARTMENT_OTHER): Payer: 59

## 2015-11-26 ENCOUNTER — Telehealth: Payer: Self-pay | Admitting: Nurse Practitioner

## 2015-11-26 ENCOUNTER — Encounter: Payer: Self-pay | Admitting: Nurse Practitioner

## 2015-11-26 VITALS — BP 110/74 | HR 95 | Temp 97.9°F | Resp 18 | Ht 68.0 in | Wt 165.6 lb

## 2015-11-26 DIAGNOSIS — C50511 Malignant neoplasm of lower-outer quadrant of right female breast: Secondary | ICD-10-CM

## 2015-11-26 DIAGNOSIS — C50811 Malignant neoplasm of overlapping sites of right female breast: Secondary | ICD-10-CM

## 2015-11-26 DIAGNOSIS — Z170 Estrogen receptor positive status [ER+]: Secondary | ICD-10-CM

## 2015-11-26 DIAGNOSIS — D6481 Anemia due to antineoplastic chemotherapy: Secondary | ICD-10-CM | POA: Diagnosis not present

## 2015-11-26 DIAGNOSIS — Z5111 Encounter for antineoplastic chemotherapy: Secondary | ICD-10-CM

## 2015-11-26 DIAGNOSIS — C773 Secondary and unspecified malignant neoplasm of axilla and upper limb lymph nodes: Secondary | ICD-10-CM

## 2015-11-26 DIAGNOSIS — R748 Abnormal levels of other serum enzymes: Secondary | ICD-10-CM

## 2015-11-26 DIAGNOSIS — T451X5A Adverse effect of antineoplastic and immunosuppressive drugs, initial encounter: Secondary | ICD-10-CM

## 2015-11-26 LAB — CBC WITH DIFFERENTIAL/PLATELET
BASO%: 0.5 % (ref 0.0–2.0)
BASOS ABS: 0 10*3/uL (ref 0.0–0.1)
EOS ABS: 0 10*3/uL (ref 0.0–0.5)
EOS%: 1 % (ref 0.0–7.0)
HCT: 31.3 % — ABNORMAL LOW (ref 34.8–46.6)
HGB: 10.2 g/dL — ABNORMAL LOW (ref 11.6–15.9)
LYMPH%: 14 % (ref 14.0–49.7)
MCH: 31.7 pg (ref 25.1–34.0)
MCHC: 32.6 g/dL (ref 31.5–36.0)
MCV: 97.2 fL (ref 79.5–101.0)
MONO#: 0.5 10*3/uL (ref 0.1–0.9)
MONO%: 12 % (ref 0.0–14.0)
NEUT#: 3 10*3/uL (ref 1.5–6.5)
NEUT%: 72.5 % (ref 38.4–76.8)
Platelets: 262 10*3/uL (ref 145–400)
RBC: 3.22 10*6/uL — AB (ref 3.70–5.45)
RDW: 15.8 % — ABNORMAL HIGH (ref 11.2–14.5)
WBC: 4.1 10*3/uL (ref 3.9–10.3)
lymph#: 0.6 10*3/uL — ABNORMAL LOW (ref 0.9–3.3)

## 2015-11-26 LAB — COMPREHENSIVE METABOLIC PANEL
ALK PHOS: 79 U/L (ref 40–150)
ALT: 84 U/L — ABNORMAL HIGH (ref 0–55)
AST: 48 U/L — ABNORMAL HIGH (ref 5–34)
Albumin: 3.5 g/dL (ref 3.5–5.0)
Anion Gap: 7 mEq/L (ref 3–11)
BUN: 18.5 mg/dL (ref 7.0–26.0)
CO2: 28 meq/L (ref 22–29)
Calcium: 9.4 mg/dL (ref 8.4–10.4)
Chloride: 109 mEq/L (ref 98–109)
Creatinine: 0.8 mg/dL (ref 0.6–1.1)
EGFR: 88 mL/min/{1.73_m2} — AB (ref >90)
GLUCOSE: 110 mg/dL (ref 70–140)
POTASSIUM: 4.1 meq/L (ref 3.5–5.1)
SODIUM: 143 meq/L (ref 136–145)
Total Bilirubin: <0.3 mg/dL (ref 0.20–1.20)
Total Protein: 6 g/dL — ABNORMAL LOW (ref 6.4–8.3)

## 2015-11-26 MED ORDER — SODIUM CHLORIDE 0.9 % IV SOLN
Freq: Once | INTRAVENOUS | Status: AC
  Administered 2015-11-26: 10:00:00 via INTRAVENOUS

## 2015-11-26 MED ORDER — DIPHENHYDRAMINE HCL 50 MG/ML IJ SOLN
INTRAMUSCULAR | Status: AC
  Filled 2015-11-26: qty 1

## 2015-11-26 MED ORDER — FAMOTIDINE IN NACL 20-0.9 MG/50ML-% IV SOLN
INTRAVENOUS | Status: AC
  Filled 2015-11-26: qty 50

## 2015-11-26 MED ORDER — HEPARIN SOD (PORK) LOCK FLUSH 100 UNIT/ML IV SOLN
500.0000 [IU] | Freq: Once | INTRAVENOUS | Status: AC | PRN
  Administered 2015-11-26: 500 [IU]
  Filled 2015-11-26: qty 5

## 2015-11-26 MED ORDER — SODIUM CHLORIDE 0.9 % IV SOLN
80.0000 mg/m2 | Freq: Once | INTRAVENOUS | Status: AC
  Administered 2015-11-26: 150 mg via INTRAVENOUS
  Filled 2015-11-26: qty 25

## 2015-11-26 MED ORDER — SODIUM CHLORIDE 0.9% FLUSH
10.0000 mL | INTRAVENOUS | Status: DC | PRN
Start: 2015-11-26 — End: 2015-11-26
  Administered 2015-11-26: 10 mL
  Filled 2015-11-26: qty 10

## 2015-11-26 MED ORDER — FAMOTIDINE IN NACL 20-0.9 MG/50ML-% IV SOLN
20.0000 mg | Freq: Once | INTRAVENOUS | Status: AC
  Administered 2015-11-26: 20 mg via INTRAVENOUS

## 2015-11-26 MED ORDER — DIPHENHYDRAMINE HCL 50 MG/ML IJ SOLN
25.0000 mg | Freq: Once | INTRAMUSCULAR | Status: AC
  Administered 2015-11-26: 25 mg via INTRAVENOUS

## 2015-11-26 MED ORDER — SODIUM CHLORIDE 0.9 % IV SOLN
10.0000 mg | Freq: Once | INTRAVENOUS | Status: AC
  Administered 2015-11-26: 10 mg via INTRAVENOUS
  Filled 2015-11-26: qty 1

## 2015-11-26 NOTE — Telephone Encounter (Signed)
appt made and avs printed °

## 2015-11-26 NOTE — Progress Notes (Signed)
[place]  [phone] [phone]     ID: Michele Alvarez DOB: [birth year]  MR#: [id number]  UGQ#:[id number]  Patient Care Team: Jonathon Jordan, MD as PCP - General (Family Medicine) Avon Gully, NP as Nurse Practitioner (Obstetrics and Gynecology) [name], MD as Consulting Physician (General Surgery) Chauncey Cruel, MD as Consulting Physician (Oncology) Eppie Gibson, MD as Attending Physician (Radiation Oncology) Sylvan Cheese, NP as Nurse Practitioner (Hematology and Oncology) Jovita Gamma, MD as Consulting Physician (Neurosurgery) PCP: Lilian Coma, MD OTHER MD:  CHIEF COMPLAINT: Node-positive breast cancer  CURRENT TREATMENT: Neoadjuvant chemotherapy  BREAST CANCER HISTORY: From the original intake note:  "Michele Alvarez" had routine screening mammography at [facility] 08/07/2015. There was an area of calcification in the right breast 2:00 position. There was also an area of calcification in the left breast 7:00 position. She was recalled for bilateral diagnostic mammography 08/12/2015. The left breast calcifications were felt to be likely benign and repeat mammography in 6 months was suggested.  On the right, breast density was category D. There was a 1.2 cm irregular mass at the 6:00, inframammary position and also suspicious calcifications. Adding those together the area measured 2.5 cm. Ultrasound of the right breast on the same day found a 1.3 cm lobulated mass in the right breast at the 6:00 position. There was a 1.7 cm axillary lymph node with a fatty hilum.   On 08/30/2015 the patient underwent biopsy of the breast mass, the abnormal lymph node, and the area of calcifications. The calcifications ([id number]) were associated with ductal carcinoma in situ, with the prognostic panel pending. 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 The lymph node also was positive.  Note that the clip to the patient's breast mass did not deploy.  The patient's subsequent history is as detailed below  INTERVAL HISTORY: Michele Alvarez returns for follow up of her breast cancer, alone. Today she is due for cycle 2 of 12 planned weekly doses of paclitaxel.   REVIEW OF SYSTEMS: Michele Alvarez continues to battle her residual sinus infection. She denies fevers or chills. She still complains of congestion, clear drainage, and an intermittent cough. She took mucincex BID x 7 days and this has helped. She thinks it has run its course however. She denies nausea, vomiting, or changes in bowel or bladder habits. The tips of her fingers are sensitive, but she denies numbness or tingling. She denies mouth sores or rashes. She has mild fatigue. A detailed review of systems is otherwise entirely stable, except where noted above.   PAST MEDICAL HISTORY: Past Medical History  Diagnosis Date  . Breast cancer of lower-outer quadrant of right female breast ([place]) 08/19/2015  . Hot flashes   . PONV (postoperative nausea and vomiting)     PAST SURGICAL HISTORY: Past Surgical History  Procedure Laterality Date  . Neck surgery  Aug 15, 1999  . Back surgery      x2, lumbar  . Portacath placement Right 09/09/2015    Procedure: INSERTION PORT-A-CATH WITH [place] ;  Surgeon: [name], MD;  Location: [place];  Service: General;  Laterality: Right;    FAMILY HISTORY No family history on file. The patient's father died in 14-Aug-2014 at the age of 89. He had significant COPD. The patient's mother died at the age of 60 from Parkinson's disease. The patient has 1 brother, 1 sister. There is no cancer  history in the family to her knowledge.  GYNECOLOGIC HISTORY:  No LMP recorded. Patient is postmenopausal. Menarche age 55. She has GI asked P0. She stopped having periods in January 2014. She never took hormone replacement or oral  contraceptives.  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 She lives with her partner, [name], and 2 cats.     ADVANCED DIRECTIVES: Not in place. At the 09/01/2015 clinic visit the patient was given the appropriate forms to complete and notarize at her discretion.   HEALTH MAINTENANCE: Social History  Substance Use Topics  . Smoking status: Never Smoker   . Smokeless tobacco: Not on file  . Alcohol Use: Yes     Comment: social     Colonoscopy: Never   PAP:  Bone density: Never  Lipid panel:  Allergies  Allergen Reactions  . Latex Other (See Comments)    Fever blisters  . Acyclovir And Related   . Skin Adhesives Dermatitis    Reaction to dermabond, do not use    Current Outpatient Prescriptions  Medication Sig Dispense Refill  . lidocaine-prilocaine (EMLA) cream Apply to affected area once 30 g 3  . omeprazole (PRILOSEC) 40 MG capsule Take 1 capsule (40 mg total) by mouth daily. 60 capsule 3  . prochlorperazine (COMPAZINE) 10 MG tablet     . LORazepam (ATIVAN) 0.5 MG tablet Take 1 tablet (0.5 mg total) by mouth every 6 (six) hours as needed (Nausea or vomiting). (Patient not taking: Reported on 10/14/2015) 30 tablet 0  . magnesium hydroxide (MILK OF MAGNESIA) 400 MG/5ML suspension Take 15 mLs by mouth daily as needed for mild constipation. Reported on 11/26/2015    . ondansetron (ZOFRAN) 8 MG tablet Take 1 tablet (8 mg total) by mouth 2 (two) times daily as needed. Start on the third day after chemotherapy. (Patient not taking: Reported on 10/29/2015) 30 tablet 1   No current facility-administered medications for this visit.    OBJECTIVE: Middle-aged white woman  Who appears stated age 55 Vitals:   11/26/15 0839  BP: 110/74  Pulse: 95  Temp: 97.9 F (36.6 C)  Resp: 18     Body mass index is 25.19 kg/(m^2).    ECOG FS:1 - Symptomatic but completely ambulatory  Skin: warm, dry  HEENT: sclerae anicteric, conjunctivae pink, oropharynx clear. No thrush  or mucositis.  Lymph Nodes: No cervical or supraclavicular lymphadenopathy  Lungs: clear to auscultation bilaterally, no rales, wheezes, or rhonci  Heart: regular rate and rhythm  Abdomen: round, soft, non tender, positive bowel sounds  Musculoskeletal: No focal spinal tenderness, no peripheral edema  Neuro: non focal, well oriented, positive affect  Breasts: deferred  LAB RESULTS:  CMP     Component Value Date/Time   NA 143 11/26/2015 0822   K 4.1 11/26/2015 0822   CO2 28 11/26/2015 0822   GLUCOSE 110 11/26/2015 0822   BUN 18.5 11/26/2015 0822   CREATININE 0.8 11/26/2015 0822   CALCIUM 9.4 11/26/2015 0822   PROT 6.0* 11/26/2015 0822   ALBUMIN 3.5 11/26/2015 0822   AST 48* 11/26/2015 0822   ALT 84* 11/26/2015 0822   ALKPHOS 79 11/26/2015 0822   BILITOT <0.30 11/26/2015 0822    INo results found for: SPEP, UPEP  Lab Results  Component Value Date   WBC 4.1 11/26/2015   NEUTROABS 3.0 11/26/2015   HGB 10.2* 11/26/2015   HCT 31.3* 11/26/2015   MCV 97.2 11/26/2015   PLT 262 11/26/2015      Chemistry  Component Value Date/Time   NA 143 11/26/2015 0822   K 4.1 11/26/2015 0822   CO2 28 11/26/2015 0822   BUN 18.5 11/26/2015 0822   CREATININE 0.8 11/26/2015 0822      Component Value Date/Time   CALCIUM 9.4 11/26/2015 0822   ALKPHOS 79 11/26/2015 0822   AST 48* 11/26/2015 0822   ALT 84* 11/26/2015 0822   BILITOT <0.30 11/26/2015 0822       No results found for: LABCA2  No components found for: LABCA125  No results for input(s): INR in the last 168 hours.  Urinalysis No results found for: COLORURINE, APPEARANCEUR, LABSPEC, [place], GLUCOSEU, HGBUR, BILIRUBINUR, KETONESUR, PROTEINUR, UROBILINOGEN, NITRITE, LEUKOCYTESUR  ELIGIBLE FOR AVAILABLE RESEARCH PROTOCOL: Alliance, PREVENT  STUDIES: Mr Breast Bilateral W Wo Contrast  11/03/2015  CLINICAL DATA:  Recently diagnosed right breast cancer. Patient is at the mid point of chemotherapy, assess for  chemotherapy response. LABS:  Does not apply EXAM: BILATERAL BREAST MRI WITH AND WITHOUT CONTRAST TECHNIQUE: Multiplanar, multisequence MR images of both breasts were obtained prior to and following the intravenous administration of 15 ml of MultiHance. THREE-DIMENSIONAL MR IMAGE RENDERING ON INDEPENDENT WORKSTATION: Three-dimensional MR images were rendered by post-processing of the original MR data on an independent workstation. The three-dimensional MR images were interpreted, and findings are reported in the following complete MRI report for this study. Three dimensional images were evaluated at the independent DynaCad workstation COMPARISON:  Previous exam(s). FINDINGS: Breast composition: d. Extreme fibroglandular tissue. Background parenchymal enhancement: Mild Right breast: The previously noted enhancing mass is significantly smaller currently measuring 9 x 7 x 4 mm with plateau enhancement kinetics, posterior depth 6 o'clock position. Left breast: No mass or abnormal enhancement. Lymph nodes: The previously described enlarged lymph nodes in the right axilla are normal size. No abnormal appearing lymph nodes. Ancillary findings:  None. IMPRESSION: Known right breast cancer, mass is significantly smaller compared to prior exam. Normal size right axillary lymph nodes on the current exam. RECOMMENDATION: Treatment plan BI-RADS CATEGORY  6: Known biopsy-proven malignancy. Electronically Signed   By: Abelardo Diesel M.D.   On: 11/03/2015 16:37     ASSESSMENT: 55 y.o. [place] woman status post right breast and axillary lymph node biopsy 08/30/2015, both positive for a clinical T1-2 N1, stage II invasive ductal carcinoma, grade 3, estrogen and progesterone receptor positive, HER-2/neu nonamplified, with an MIB-1 of 80%  (1) neoadjuvant chemotherapy started 09/16/2015 consisting of doxorubicin and cyclophosphamide in dose dense fashion 4,  to be followed by paclitaxel weekly 12  Starting 11/10/2015  (2)  definitive surgery to follow with consideration of the Alliance trial  (3) adjuvant radiation to follow surgery  (4) adjuvant antiestrogen to begin at the completion of local therapy  PLAN: Michele Alvarez is stable today. Her sinus symptoms continue to linger, but are improving slowly. The labs were reviewed in detail and were stable. She has continued mild elevation to her AST and AST. This was discussed with Dr. Jana Hakim. He suggests proceeding with cycle 2 of paclitaxel as planned today. If they increase next week, we may hold until the values normalize. She has treatment related anemia, but is asymptomatic.   Michele Alvarez will return in 1 week for cycle 3 of treatment. She understands and agrees with this plan. She knows the goal of treatment in her case is cure. She has been encouraged to call with any issues that might arise before her next visit here.   Laurie Panda, NP   11/26/2015 9:12 AM

## 2015-11-26 NOTE — Patient Instructions (Signed)
[facility] Discharge Instructions for Patients Receiving Chemotherapy  Today you received the following chemotherapy agents:  Taxol  To help prevent nausea and vomiting after your treatment, we encourage you to take your nausea medication as prescribed.   If you develop nausea and vomiting that is not controlled by your nausea medication, call the clinic.   BELOW ARE SYMPTOMS THAT SHOULD BE REPORTED IMMEDIATELY:  *FEVER GREATER THAN 100.5 F  *CHILLS WITH OR WITHOUT FEVER  NAUSEA AND VOMITING THAT IS NOT CONTROLLED WITH YOUR NAUSEA MEDICATION  *UNUSUAL SHORTNESS OF BREATH  *UNUSUAL BRUISING OR BLEEDING  TENDERNESS IN MOUTH AND THROAT WITH OR WITHOUT PRESENCE OF ULCERS  *URINARY PROBLEMS  *BOWEL PROBLEMS  UNUSUAL RASH Items with * indicate a potential emergency and should be followed up as soon as possible.  Feel free to call the clinic you have any questions or concerns. The clinic phone number is [phone].  Please show the CHEMO ALERT CARD at check-in to the Emergency Department and triage nurse.   

## 2015-11-26 NOTE — Progress Notes (Signed)
Spoke to Dr. Jana Hakim about ALT 84. States ok to treat.

## 2015-12-03 ENCOUNTER — Ambulatory Visit (HOSPITAL_BASED_OUTPATIENT_CLINIC_OR_DEPARTMENT_OTHER): Payer: 59 | Admitting: Nurse Practitioner

## 2015-12-03 ENCOUNTER — Encounter: Payer: Self-pay | Admitting: Nurse Practitioner

## 2015-12-03 ENCOUNTER — Other Ambulatory Visit (HOSPITAL_BASED_OUTPATIENT_CLINIC_OR_DEPARTMENT_OTHER): Payer: 59

## 2015-12-03 ENCOUNTER — Ambulatory Visit (HOSPITAL_BASED_OUTPATIENT_CLINIC_OR_DEPARTMENT_OTHER): Payer: 59

## 2015-12-03 VITALS — BP 103/74 | HR 81 | Temp 98.4°F | Resp 18 | Ht 68.0 in | Wt 165.8 lb

## 2015-12-03 DIAGNOSIS — C50511 Malignant neoplasm of lower-outer quadrant of right female breast: Secondary | ICD-10-CM

## 2015-12-03 DIAGNOSIS — R748 Abnormal levels of other serum enzymes: Secondary | ICD-10-CM

## 2015-12-03 DIAGNOSIS — C50811 Malignant neoplasm of overlapping sites of right female breast: Secondary | ICD-10-CM

## 2015-12-03 DIAGNOSIS — C773 Secondary and unspecified malignant neoplasm of axilla and upper limb lymph nodes: Secondary | ICD-10-CM

## 2015-12-03 DIAGNOSIS — Z170 Estrogen receptor positive status [ER+]: Secondary | ICD-10-CM | POA: Diagnosis not present

## 2015-12-03 DIAGNOSIS — Z5111 Encounter for antineoplastic chemotherapy: Secondary | ICD-10-CM

## 2015-12-03 DIAGNOSIS — R740 Nonspecific elevation of levels of transaminase and lactic acid dehydrogenase [LDH]: Secondary | ICD-10-CM

## 2015-12-03 LAB — CBC WITH DIFFERENTIAL/PLATELET
BASO%: 0.8 % (ref 0.0–2.0)
Basophils Absolute: 0 10*3/uL (ref 0.0–0.1)
EOS%: 1.6 % (ref 0.0–7.0)
Eosinophils Absolute: 0.1 10*3/uL (ref 0.0–0.5)
HCT: 31.5 % — ABNORMAL LOW (ref 34.8–46.6)
HGB: 10.2 g/dL — ABNORMAL LOW (ref 11.6–15.9)
LYMPH%: 16.6 % (ref 14.0–49.7)
MCH: 31.9 pg (ref 25.1–34.0)
MCHC: 32.4 g/dL (ref 31.5–36.0)
MCV: 98.4 fL (ref 79.5–101.0)
MONO#: 0.5 10*3/uL (ref 0.1–0.9)
MONO%: 12.2 % (ref 0.0–14.0)
NEUT%: 68.8 % (ref 38.4–76.8)
NEUTROS ABS: 2.7 10*3/uL (ref 1.5–6.5)
PLATELETS: 231 10*3/uL (ref 145–400)
RBC: 3.2 10*6/uL — AB (ref 3.70–5.45)
RDW: 15.7 % — ABNORMAL HIGH (ref 11.2–14.5)
WBC: 3.9 10*3/uL (ref 3.9–10.3)
lymph#: 0.6 10*3/uL — ABNORMAL LOW (ref 0.9–3.3)

## 2015-12-03 LAB — COMPREHENSIVE METABOLIC PANEL
ALT: 80 U/L — AB (ref 0–55)
ANION GAP: 6 meq/L (ref 3–11)
AST: 41 U/L — AB (ref 5–34)
Albumin: 3.6 g/dL (ref 3.5–5.0)
Alkaline Phosphatase: 78 U/L (ref 40–150)
BILIRUBIN TOTAL: 0.36 mg/dL (ref 0.20–1.20)
BUN: 14.2 mg/dL (ref 7.0–26.0)
CHLORIDE: 107 meq/L (ref 98–109)
CO2: 29 meq/L (ref 22–29)
CREATININE: 0.7 mg/dL (ref 0.6–1.1)
Calcium: 9.6 mg/dL (ref 8.4–10.4)
EGFR: >90 mL/min/{1.73_m2} (ref >90)
GLUCOSE: 98 mg/dL (ref 70–140)
Potassium: 4.2 mEq/L (ref 3.5–5.1)
SODIUM: 142 meq/L (ref 136–145)
TOTAL PROTEIN: 6.1 g/dL — AB (ref 6.4–8.3)

## 2015-12-03 MED ORDER — SODIUM CHLORIDE 0.9 % IV SOLN
80.0000 mg/m2 | Freq: Once | INTRAVENOUS | Status: AC
  Administered 2015-12-03: 150 mg via INTRAVENOUS
  Filled 2015-12-03: qty 25

## 2015-12-03 MED ORDER — DIPHENHYDRAMINE HCL 50 MG/ML IJ SOLN
25.0000 mg | Freq: Once | INTRAMUSCULAR | Status: AC
  Administered 2015-12-03: 25 mg via INTRAVENOUS

## 2015-12-03 MED ORDER — SODIUM CHLORIDE 0.9% FLUSH
10.0000 mL | INTRAVENOUS | Status: DC | PRN
  Administered 2015-12-03: 10 mL
  Filled 2015-12-03: qty 10

## 2015-12-03 MED ORDER — SODIUM CHLORIDE 0.9 % IV SOLN
Freq: Once | INTRAVENOUS | Status: AC
  Administered 2015-12-03: 12:00:00 via INTRAVENOUS

## 2015-12-03 MED ORDER — FAMOTIDINE IN NACL 20-0.9 MG/50ML-% IV SOLN
INTRAVENOUS | Status: AC
  Filled 2015-12-03: qty 50

## 2015-12-03 MED ORDER — SODIUM CHLORIDE 0.9 % IV SOLN
4.0000 mg | Freq: Once | INTRAVENOUS | Status: AC
  Administered 2015-12-03: 4 mg via INTRAVENOUS
  Filled 2015-12-03: qty 0.4

## 2015-12-03 MED ORDER — DIPHENHYDRAMINE HCL 50 MG/ML IJ SOLN
INTRAMUSCULAR | Status: AC
  Filled 2015-12-03: qty 1

## 2015-12-03 MED ORDER — FAMOTIDINE IN NACL 20-0.9 MG/50ML-% IV SOLN
20.0000 mg | Freq: Once | INTRAVENOUS | Status: AC
  Administered 2015-12-03: 20 mg via INTRAVENOUS

## 2015-12-03 MED ORDER — HEPARIN SOD (PORK) LOCK FLUSH 100 UNIT/ML IV SOLN
500.0000 [IU] | Freq: Once | INTRAVENOUS | Status: AC | PRN
  Administered 2015-12-03: 500 [IU]
  Filled 2015-12-03: qty 5

## 2015-12-03 NOTE — Progress Notes (Signed)
[place]  [phone] Fax:(336) [phone]     ID: [name] DOB: 21-May-1961  MR#: [id number]  KGM#:[id number]  Patient Care Team: Jonathon Jordan, MD as PCP - General (Family Medicine) Avon Gully, NP as Nurse Practitioner (Obstetrics and Gynecology) [name], MD as Consulting Physician (General Surgery) Chauncey Cruel, MD as Consulting Physician (Oncology) Eppie Gibson, MD as Attending Physician (Radiation Oncology) Sylvan Cheese, NP as Nurse Practitioner (Hematology and Oncology) Jovita Gamma, MD as Consulting Physician (Neurosurgery) PCP: Lilian Coma, MD OTHER MD:  CHIEF COMPLAINT: Node-positive breast cancer  CURRENT TREATMENT: Neoadjuvant chemotherapy  BREAST CANCER HISTORY: From the original intake note:  "Michele Alvarez" had routine screening mammography at [facility] 08/07/2015. There was an area of calcification in the right breast 2:00 position. There was also an area of calcification in the left breast 7:00 position. She was recalled for bilateral diagnostic mammography 08/12/2015. The left breast calcifications were felt to be likely benign and repeat mammography in 6 months was suggested.  On the right, breast density was category D. There was a 1.2 cm irregular mass at the 6:00, inframammary position and also suspicious calcifications. Adding those together the area measured 2.5 cm. Ultrasound of the right breast on the same day found a 1.3 cm lobulated mass in the right breast at the 6:00 position. There was a 1.7 cm axillary lymph node with a fatty hilum.   On 08/30/2015 the patient underwent biopsy of the breast mass, the abnormal lymph node, and the area of calcifications. The calcifications ([id number]) were associated with ductal carcinoma in situ, with the prognostic panel pending. 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 The lymph node also was positive.  Note that the clip to the patient's breast mass did not deploy.  The patient's subsequent history is as detailed below  INTERVAL HISTORY: Michele Alvarez returns for follow up of her breast cancer, alone. Today she is due for cycle 3 of 12 planned weekly doses of paclitaxel.   REVIEW OF SYSTEMS: Michele Alvarez denies fevers, chills, nausea, or vomiting. She is moving her bowels well. The skin around the tips of her fingers is dry and cracked, but she denies numbness or tingling. They are just sensitive. She denies mouth sores or rashes. Her appetite is down, but she is maintaining her weight well. She is not staying as well hydrated as she should. She has some mild blood tinged grainage from her nose. A detailed review of systems is otherwise stable.  PAST MEDICAL HISTORY: Past Medical History  Diagnosis Date  . Breast cancer of lower-outer quadrant of right female breast ([place]) 08/19/2015  . Hot flashes   . PONV (postoperative nausea and vomiting)     PAST SURGICAL HISTORY: Past Surgical History  Procedure Laterality Date  . Neck surgery  September 24, 1999  . Back surgery      x2, lumbar  . Portacath placement Right 09/09/2015    Procedure: INSERTION PORT-A-CATH WITH [place] ;  Surgeon: [name], MD;  Location: [place];  Service: General;  Laterality: Right;    FAMILY HISTORY No family history on file. The patient's father died in 09/23/14 at the age of 44. He had significant COPD. The patient's mother died at the age of 14 from Parkinson's disease. The patient has 1 brother, 1 sister. There is no cancer history in the family to her knowledge.  GYNECOLOGIC HISTORY:  No LMP recorded. Patient is postmenopausal. Menarche age 6. She has GI asked P0. She stopped having periods in January 2014. She never took hormone replacement or oral contraceptives.  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 She lives with her partner,  [name], and 2 cats.     ADVANCED DIRECTIVES: Not in place. At the 09/01/2015 clinic visit the patient was given the appropriate forms to complete and notarize at her discretion.   HEALTH MAINTENANCE: Social History  Substance Use Topics  . Smoking status: Never Smoker   . Smokeless tobacco: Not on file  . Alcohol Use: Yes     Comment: social     Colonoscopy: Never   PAP:  Bone density: Never  Lipid panel:  Allergies  Allergen Reactions  . Latex Other (See Comments)    Fever blisters  . Acyclovir And Related   . Skin Adhesives Dermatitis    Reaction to dermabond, do not use    Current Outpatient Prescriptions  Medication Sig Dispense Refill  . lidocaine-prilocaine (EMLA) cream Apply to affected area once 30 g 3  . magnesium hydroxide (MILK OF MAGNESIA) 400 MG/5ML suspension Take 15 mLs by mouth daily as needed for mild constipation. Reported on 11/26/2015    . omeprazole (PRILOSEC) 40 MG capsule Take 1 capsule (40 mg total) by mouth daily. 60 capsule 3  . prochlorperazine (COMPAZINE) 10 MG tablet     . LORazepam (ATIVAN) 0.5 MG tablet Take 1 tablet (0.5 mg total) by mouth every 6 (six) hours as needed (Nausea or vomiting). (Patient not taking: Reported on 10/14/2015) 30 tablet 0  . ondansetron (ZOFRAN) 8 MG tablet Take 1 tablet (8 mg total) by mouth 2 (two) times daily as needed. Start on the third day after chemotherapy. (Patient not taking: Reported on 10/29/2015) 30 tablet 1   No current facility-administered medications for this visit.    OBJECTIVE: Middle-aged white woman  Who appears stated age 56 Vitals:   12/03/15 1015  BP: 103/74  Pulse: 81  Temp: 98.4 F (36.9 C)  Resp: 18     Body mass index is 25.22 kg/(m^2).    ECOG FS:1 - Symptomatic but completely ambulatory  Sclerae unicteric, pupils round and equal Oropharynx clear and moist-- no thrush or other lesions No cervical or supraclavicular adenopathy Lungs no rales or rhonchi Heart regular rate  and rhythm Abd soft, nontender, positive bowel sounds MSK no focal spinal tenderness, no upper extremity lymphedema Neuro: nonfocal, well oriented, appropriate affect Breasts: deferred  LAB RESULTS:  CMP     Component Value Date/Time   NA 142 12/03/2015 1003   K 4.2 12/03/2015 1003   CO2 29 12/03/2015 1003   GLUCOSE 98 12/03/2015 1003   BUN 14.2 12/03/2015 1003   CREATININE 0.7 12/03/2015 1003   CALCIUM 9.6 12/03/2015 1003   PROT 6.1* 12/03/2015 1003   ALBUMIN 3.6 12/03/2015 1003   AST 41* 12/03/2015 1003   ALT 80* 12/03/2015 1003   ALKPHOS 78 12/03/2015 1003   BILITOT 0.36 12/03/2015 1003    INo results found for: SPEP, UPEP  Lab Results  Component Value Date   WBC 3.9 12/03/2015   NEUTROABS 2.7 12/03/2015   HGB 10.2* 12/03/2015   HCT 31.5* 12/03/2015   MCV 98.4 12/03/2015   PLT 231 12/03/2015      Chemistry      Component Value Date/Time   NA 142 12/03/2015 1003   K 4.2 12/03/2015 1003   CO2 29 12/03/2015 1003  BUN 14.2 12/03/2015 1003   CREATININE 0.7 12/03/2015 1003      Component Value Date/Time   CALCIUM 9.6 12/03/2015 1003   ALKPHOS 78 12/03/2015 1003   AST 41* 12/03/2015 1003   ALT 80* 12/03/2015 1003   BILITOT 0.36 12/03/2015 1003       No results found for: LABCA2  No components found for: LABCA125  No results for input(s): INR in the last 168 hours.  Urinalysis No results found for: COLORURINE, APPEARANCEUR, LABSPEC, [place], GLUCOSEU, HGBUR, BILIRUBINUR, KETONESUR, PROTEINUR, UROBILINOGEN, NITRITE, LEUKOCYTESUR  ELIGIBLE FOR AVAILABLE RESEARCH PROTOCOL: Alliance, PREVENT  STUDIES: Mr Breast Bilateral W Wo Contrast  11/03/2015  CLINICAL DATA:  Recently diagnosed right breast cancer. Patient is at the mid point of chemotherapy, assess for chemotherapy response. LABS:  Does not apply EXAM: BILATERAL BREAST MRI WITH AND WITHOUT CONTRAST TECHNIQUE: Multiplanar, multisequence MR images of both breasts were obtained prior to and following  the intravenous administration of 15 ml of MultiHance. THREE-DIMENSIONAL MR IMAGE RENDERING ON INDEPENDENT WORKSTATION: Three-dimensional MR images were rendered by post-processing of the original MR data on an independent workstation. The three-dimensional MR images were interpreted, and findings are reported in the following complete MRI report for this study. Three dimensional images were evaluated at the independent DynaCad workstation COMPARISON:  Previous exam(s). FINDINGS: Breast composition: d. Extreme fibroglandular tissue. Background parenchymal enhancement: Mild Right breast: The previously noted enhancing mass is significantly smaller currently measuring 9 x 7 x 4 mm with plateau enhancement kinetics, posterior depth 6 o'clock position. Left breast: No mass or abnormal enhancement. Lymph nodes: The previously described enlarged lymph nodes in the right axilla are normal size. No abnormal appearing lymph nodes. Ancillary findings:  None. IMPRESSION: Known right breast cancer, mass is significantly smaller compared to prior exam. Normal size right axillary lymph nodes on the current exam. RECOMMENDATION: Treatment plan BI-RADS CATEGORY  6: Known biopsy-proven malignancy. Electronically Signed   By: Abelardo Diesel M.D.   On: 11/03/2015 16:37     ASSESSMENT: 55 y.o. [place] woman status post right breast and axillary lymph node biopsy 08/30/2015, both positive for a clinical T1-2 N1, stage II invasive ductal carcinoma, grade 3, estrogen and progesterone receptor positive, HER-2/neu nonamplified, with an MIB-1 of 80%  (1) neoadjuvant chemotherapy started 09/16/2015 consisting of doxorubicin and cyclophosphamide in dose dense fashion 4,  to be followed by paclitaxel weekly 12  Starting 11/10/2015  (2) definitive surgery to follow with consideration of the Alliance trial  (3) adjuvant radiation to follow surgery  (4) adjuvant antiestrogen to begin at the completion of local  therapy  PLAN: Michele Alvarez continues to tolerate treatment well with no new complaints. The labs were reviewed in detail and were stable. Her AST and ALT remain elevated, but are no worse than last week. I consulted with Dr. Lindi Adie he suggests proceeding with cycle 3 of paclitaxel as planned today.   Michele Alvarez will return in 1 week for cycle 4 of treatment. She understands and agrees with this plan. She knows the goal of treatment in her case is cure. She has been encouraged to call with any issues that might arise before her next visit here.   Laurie Panda, NP   12/03/2015 11:31 AM

## 2015-12-03 NOTE — Patient Instructions (Signed)
[facility] Discharge Instructions for Patients Receiving Chemotherapy  Today you received the following chemotherapy agents:  Taxol  To help prevent nausea and vomiting after your treatment, we encourage you to take your nausea medication as prescribed.   If you develop nausea and vomiting that is not controlled by your nausea medication, call the clinic.   BELOW ARE SYMPTOMS THAT SHOULD BE REPORTED IMMEDIATELY:  *FEVER GREATER THAN 100.5 F  *CHILLS WITH OR WITHOUT FEVER  NAUSEA AND VOMITING THAT IS NOT CONTROLLED WITH YOUR NAUSEA MEDICATION  *UNUSUAL SHORTNESS OF BREATH  *UNUSUAL BRUISING OR BLEEDING  TENDERNESS IN MOUTH AND THROAT WITH OR WITHOUT PRESENCE OF ULCERS  *URINARY PROBLEMS  *BOWEL PROBLEMS  UNUSUAL RASH Items with * indicate a potential emergency and should be followed up as soon as possible.  Feel free to call the clinic you have any questions or concerns. The clinic phone number is [phone].  Please show the CHEMO ALERT CARD at check-in to the Emergency Department and triage nurse.   

## 2015-12-10 ENCOUNTER — Ambulatory Visit (HOSPITAL_BASED_OUTPATIENT_CLINIC_OR_DEPARTMENT_OTHER): Payer: 59 | Admitting: Nurse Practitioner

## 2015-12-10 ENCOUNTER — Encounter: Payer: Self-pay | Admitting: Nurse Practitioner

## 2015-12-10 ENCOUNTER — Encounter: Payer: Self-pay | Admitting: *Deleted

## 2015-12-10 ENCOUNTER — Telehealth: Payer: Self-pay | Admitting: Nurse Practitioner

## 2015-12-10 ENCOUNTER — Other Ambulatory Visit: Payer: Self-pay | Admitting: Oncology

## 2015-12-10 ENCOUNTER — Other Ambulatory Visit (HOSPITAL_BASED_OUTPATIENT_CLINIC_OR_DEPARTMENT_OTHER): Payer: 59

## 2015-12-10 ENCOUNTER — Ambulatory Visit (HOSPITAL_BASED_OUTPATIENT_CLINIC_OR_DEPARTMENT_OTHER): Payer: 59

## 2015-12-10 VITALS — BP 107/75 | HR 85 | Temp 98.4°F | Resp 18 | Wt 165.5 lb

## 2015-12-10 DIAGNOSIS — C50511 Malignant neoplasm of lower-outer quadrant of right female breast: Secondary | ICD-10-CM

## 2015-12-10 DIAGNOSIS — Z170 Estrogen receptor positive status [ER+]: Secondary | ICD-10-CM

## 2015-12-10 DIAGNOSIS — R748 Abnormal levels of other serum enzymes: Secondary | ICD-10-CM | POA: Diagnosis not present

## 2015-12-10 DIAGNOSIS — Z5111 Encounter for antineoplastic chemotherapy: Secondary | ICD-10-CM

## 2015-12-10 LAB — COMPREHENSIVE METABOLIC PANEL
ALBUMIN: 3.7 g/dL (ref 3.5–5.0)
ALT: 63 U/L — AB (ref 0–55)
AST: 37 U/L — AB (ref 5–34)
Alkaline Phosphatase: 81 U/L (ref 40–150)
Anion Gap: 7 mEq/L (ref 3–11)
BUN: 15.9 mg/dL (ref 7.0–26.0)
CHLORIDE: 107 meq/L (ref 98–109)
CO2: 28 mEq/L (ref 22–29)
Calcium: 9.6 mg/dL (ref 8.4–10.4)
Creatinine: 0.8 mg/dL (ref 0.6–1.1)
EGFR: >90 mL/min/{1.73_m2} (ref >90)
GLUCOSE: 106 mg/dL (ref 70–140)
POTASSIUM: 4.5 meq/L (ref 3.5–5.1)
SODIUM: 142 meq/L (ref 136–145)
Total Bilirubin: 0.38 mg/dL (ref 0.20–1.20)
Total Protein: 6.4 g/dL (ref 6.4–8.3)

## 2015-12-10 LAB — CBC WITH DIFFERENTIAL/PLATELET
BASO%: 0.5 % (ref 0.0–2.0)
BASOS ABS: 0 10*3/uL (ref 0.0–0.1)
EOS%: 1.3 % (ref 0.0–7.0)
Eosinophils Absolute: 0.1 10*3/uL (ref 0.0–0.5)
HCT: 33.6 % — ABNORMAL LOW (ref 34.8–46.6)
HEMOGLOBIN: 10.8 g/dL — AB (ref 11.6–15.9)
LYMPH%: 15.4 % (ref 14.0–49.7)
MCH: 31.6 pg (ref 25.1–34.0)
MCHC: 32.1 g/dL (ref 31.5–36.0)
MCV: 98.2 fL (ref 79.5–101.0)
MONO#: 0.5 10*3/uL (ref 0.1–0.9)
MONO%: 12.2 % (ref 0.0–14.0)
NEUT#: 2.7 10*3/uL (ref 1.5–6.5)
NEUT%: 70.6 % (ref 38.4–76.8)
Platelets: 277 10*3/uL (ref 145–400)
RBC: 3.42 10*6/uL — ABNORMAL LOW (ref 3.70–5.45)
RDW: 15.3 % — AB (ref 11.2–14.5)
WBC: 3.8 10*3/uL — ABNORMAL LOW (ref 3.9–10.3)
lymph#: 0.6 10*3/uL — ABNORMAL LOW (ref 0.9–3.3)

## 2015-12-10 MED ORDER — HEPARIN SOD (PORK) LOCK FLUSH 100 UNIT/ML IV SOLN
500.0000 [IU] | Freq: Once | INTRAVENOUS | Status: AC | PRN
  Administered 2015-12-10: 500 [IU]
  Filled 2015-12-10: qty 5

## 2015-12-10 MED ORDER — FAMOTIDINE IN NACL 20-0.9 MG/50ML-% IV SOLN
INTRAVENOUS | Status: AC
  Filled 2015-12-10: qty 50

## 2015-12-10 MED ORDER — SODIUM CHLORIDE 0.9 % IV SOLN
Freq: Once | INTRAVENOUS | Status: AC
  Administered 2015-12-10: 12:00:00 via INTRAVENOUS

## 2015-12-10 MED ORDER — FAMOTIDINE IN NACL 20-0.9 MG/50ML-% IV SOLN
20.0000 mg | Freq: Once | INTRAVENOUS | Status: AC
Start: 2015-12-10 — End: 2015-12-10
  Administered 2015-12-10: 20 mg via INTRAVENOUS

## 2015-12-10 MED ORDER — SODIUM CHLORIDE 0.9 % IV SOLN
80.0000 mg/m2 | Freq: Once | INTRAVENOUS | Status: AC
  Administered 2015-12-10: 150 mg via INTRAVENOUS
  Filled 2015-12-10: qty 25

## 2015-12-10 MED ORDER — SODIUM CHLORIDE 0.9% FLUSH
10.0000 mL | INTRAVENOUS | Status: DC | PRN
  Administered 2015-12-10: 10 mL
  Filled 2015-12-10: qty 10

## 2015-12-10 MED ORDER — DIPHENHYDRAMINE HCL 50 MG/ML IJ SOLN
25.0000 mg | Freq: Once | INTRAMUSCULAR | Status: AC
  Administered 2015-12-10: 25 mg via INTRAVENOUS

## 2015-12-10 MED ORDER — SODIUM CHLORIDE 0.9 % IV SOLN
4.0000 mg | Freq: Once | INTRAVENOUS | Status: AC
  Administered 2015-12-10: 4 mg via INTRAVENOUS
  Filled 2015-12-10: qty 0.4

## 2015-12-10 MED ORDER — DIPHENHYDRAMINE HCL 50 MG/ML IJ SOLN
INTRAMUSCULAR | Status: AC
  Filled 2015-12-10: qty 1

## 2015-12-10 NOTE — Telephone Encounter (Signed)
appt made and avs printed °

## 2015-12-10 NOTE — Patient Instructions (Signed)
[facility] Discharge Instructions for Patients Receiving Chemotherapy  Today you received the following chemotherapy agents:  Taxol  To help prevent nausea and vomiting after your treatment, we encourage you to take your nausea medication as prescribed.   If you develop nausea and vomiting that is not controlled by your nausea medication, call the clinic.   BELOW ARE SYMPTOMS THAT SHOULD BE REPORTED IMMEDIATELY:  *FEVER GREATER THAN 100.5 F  *CHILLS WITH OR WITHOUT FEVER  NAUSEA AND VOMITING THAT IS NOT CONTROLLED WITH YOUR NAUSEA MEDICATION  *UNUSUAL SHORTNESS OF BREATH  *UNUSUAL BRUISING OR BLEEDING  TENDERNESS IN MOUTH AND THROAT WITH OR WITHOUT PRESENCE OF ULCERS  *URINARY PROBLEMS  *BOWEL PROBLEMS  UNUSUAL RASH Items with * indicate a potential emergency and should be followed up as soon as possible.  Feel free to call the clinic you have any questions or concerns. The clinic phone number is [phone].  Please show the CHEMO ALERT CARD at check-in to the Emergency Department and triage nurse.   

## 2015-12-10 NOTE — Progress Notes (Signed)
[place]  Telephone:(336) [phone] [phone]) [phone]   ID: Michele Alvarez DOB: [birth year]  MR#: [id number]  ESL#:[id number]  Patient Care Team: Jonathon Jordan, MD as PCP - General (Family Medicine) Avon Gully, NP as Nurse Practitioner (Obstetrics and Gynecology) [name], MD as Consulting Physician (General Surgery) Chauncey Cruel, MD as Consulting Physician (Oncology) Eppie Gibson, MD as Attending Physician (Radiation Oncology) Sylvan Cheese, NP as Nurse Practitioner (Hematology and Oncology) Jovita Gamma, MD as Consulting Physician (Neurosurgery) PCP: Lilian Coma, MD OTHER MD:  CHIEF COMPLAINT: Node-positive breast cancer  CURRENT TREATMENT: Neoadjuvant chemotherapy  BREAST CANCER HISTORY: From the original intake note:  "Michele Alvarez" had routine screening mammography at [facility] 08/07/2015. There was an area of calcification in the right breast 2:00 position. There was also an area of calcification in the left breast 7:00 position. She was recalled for bilateral diagnostic mammography 08/12/2015. The left breast calcifications were felt to be likely benign and repeat mammography in 6 months was suggested.  On the right, breast density was category D. There was a 1.2 cm irregular mass at the 6:00, inframammary position and also suspicious calcifications. Adding those together the area measured 2.5 cm. Ultrasound of the right breast on the same day found a 1.3 cm lobulated mass in the right breast at the 6:00 position. There was a 1.7 cm axillary lymph node with a fatty hilum.   On 08/30/2015 the patient underwent biopsy of the breast mass, the abnormal lymph node, and the area of calcifications. The calcifications ([id number]) were associated with ductal carcinoma in situ, with the prognostic panel pending. 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 The lymph node also was positive.  Note that the clip to the patient's breast mass did not deploy.  The patient's subsequent history is as detailed below  INTERVAL HISTORY: Michele Alvarez returns for follow up of her breast cancer, alone. Today she is due for cycle 4 of 12 planned weekly doses of paclitaxel.   REVIEW OF SYSTEMS: Michele Alvarez denies fevers, chills, nausea, or vomiting. She is moving her bowels well. Her nail beds are tender, but she denies numbness or tingling to the actual finger pads. She has stable burning and sensation changes from her right buttock to the bottom of her right foot that was present prior to the start of chemo from a back injury. She denies mouth sores or rashes. Her appetite is fair and she is maintaining her weight well. Her sinus drainage is finally clearing up. A detailed review of systems is otherwise stable.  PAST MEDICAL HISTORY: Past Medical History  Diagnosis Date  . Breast cancer of lower-outer quadrant of right female breast ([place]) 08/19/2015  . Hot flashes   . PONV (postoperative nausea and vomiting)     PAST SURGICAL HISTORY: Past Surgical History  Procedure Laterality Date  . Neck surgery  10-05-99  . Back surgery      x2, lumbar  . Portacath placement Right 09/09/2015    Procedure: INSERTION PORT-A-CATH WITH [place] ;  Surgeon: [name], MD;  Location: [place];  Service: General;  Laterality: Right;    FAMILY HISTORY No family history on file. The patient's father died in October 04, 2014 at the age of 22. He had significant COPD. The patient's mother died at the age of 46 from Parkinson's disease. The patient has 1 brother, 1 sister. There is no cancer history  in the family to her knowledge.  GYNECOLOGIC HISTORY:  No LMP recorded. Patient is postmenopausal. Menarche age 45. She has GI asked P0. She stopped having periods in January 2014. She never took hormone replacement or oral contraceptives.  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 She lives with her partner, [name], and 2 cats.     ADVANCED DIRECTIVES: Not in place. At the 09/01/2015 clinic visit the patient was given the appropriate forms to complete and notarize at her discretion.   HEALTH MAINTENANCE: Social History  Substance Use Topics  . Smoking status: Never Smoker   . Smokeless tobacco: Not on file  . Alcohol Use: Yes     Comment: social     Colonoscopy: Never   PAP:  Bone density: Never  Lipid panel:  Allergies  Allergen Reactions  . Latex Other (See Comments)    Fever blisters  . Acyclovir And Related   . Skin Adhesives Dermatitis    Reaction to dermabond, do not use    Current Outpatient Prescriptions  Medication Sig Dispense Refill  . lidocaine-prilocaine (EMLA) cream Apply to affected area once 30 g 3  . omeprazole (PRILOSEC) 40 MG capsule Take 1 capsule (40 mg total) by mouth daily. 60 capsule 3  . prochlorperazine (COMPAZINE) 10 MG tablet Reported on 12/10/2015    . LORazepam (ATIVAN) 0.5 MG tablet Take 1 tablet (0.5 mg total) by mouth every 6 (six) hours as needed (Nausea or vomiting). (Patient not taking: Reported on 10/14/2015) 30 tablet 0  . magnesium hydroxide (MILK OF MAGNESIA) 400 MG/5ML suspension Take 15 mLs by mouth daily as needed for mild constipation. Reported on 12/10/2015    . ondansetron (ZOFRAN) 8 MG tablet Take 1 tablet (8 mg total) by mouth 2 (two) times daily as needed. Start on the third day after chemotherapy. (Patient not taking: Reported on 10/29/2015) 30 tablet 1   No current facility-administered medications for this visit.    OBJECTIVE: Middle-aged white woman  Who appears stated age 55 Vitals:   12/10/15 1042  BP: 107/75  Pulse: 85  Temp: 98.4 F (36.9 C)  Resp: 18     Body mass index is 25.17 kg/(m^2).    ECOG FS:1 - Symptomatic but completely ambulatory  Skin: warm, dry  HEENT: sclerae anicteric, conjunctivae pink, oropharynx clear. No thrush or  mucositis.  Lymph Nodes: No cervical or supraclavicular lymphadenopathy  Lungs: clear to auscultation bilaterally, no rales, wheezes, or rhonci  Heart: regular rate and rhythm  Abdomen: round, soft, non tender, positive bowel sounds  Musculoskeletal: No focal spinal tenderness, no peripheral edema  Neuro: non focal, well oriented, positive affect  Breasts: deferred  LAB RESULTS:  CMP     Component Value Date/Time   NA 142 12/03/2015 1003   K 4.2 12/03/2015 1003   CO2 29 12/03/2015 1003   GLUCOSE 98 12/03/2015 1003   BUN 14.2 12/03/2015 1003   CREATININE 0.7 12/03/2015 1003   CALCIUM 9.6 12/03/2015 1003   PROT 6.1* 12/03/2015 1003   ALBUMIN 3.6 12/03/2015 1003   AST 41* 12/03/2015 1003   ALT 80* 12/03/2015 1003   ALKPHOS 78 12/03/2015 1003   BILITOT 0.36 12/03/2015 1003    INo results found for: SPEP, UPEP  Lab Results  Component Value Date   WBC 3.8* 12/10/2015   NEUTROABS 2.7 12/10/2015   HGB 10.8* 12/10/2015   HCT 33.6* 12/10/2015   MCV 98.2 12/10/2015   PLT 277 12/10/2015      Chemistry  Component Value Date/Time   NA 142 12/03/2015 1003   K 4.2 12/03/2015 1003   CO2 29 12/03/2015 1003   BUN 14.2 12/03/2015 1003   CREATININE 0.7 12/03/2015 1003      Component Value Date/Time   CALCIUM 9.6 12/03/2015 1003   ALKPHOS 78 12/03/2015 1003   AST 41* 12/03/2015 1003   ALT 80* 12/03/2015 1003   BILITOT 0.36 12/03/2015 1003       No results found for: LABCA2  No components found for: LABCA125  No results for input(s): INR in the last 168 hours.  Urinalysis No results found for: COLORURINE, APPEARANCEUR, LABSPEC, PHURINE, GLUCOSEU, HGBUR, BILIRUBINUR, KETONESUR, PROTEINUR, UROBILINOGEN, NITRITE, LEUKOCYTESUR  ELIGIBLE FOR AVAILABLE RESEARCH PROTOCOL: Alliance, PREVENT  STUDIES: No results found.   ASSESSMENT: 55 y.o. [place] woman status post right breast and axillary lymph node biopsy 08/30/2015, both positive for a clinical T1-2 N1, stage II  invasive ductal carcinoma, grade 3, estrogen and progesterone receptor positive, HER-2/neu nonamplified, with an MIB-1 of 80%  (1) neoadjuvant chemotherapy started 09/16/2015 consisting of doxorubicin and cyclophosphamide in dose dense fashion 4,  to be followed by paclitaxel weekly 12  Starting 11/10/2015  (2) definitive surgery to follow with consideration of the Alliance trial  (3) adjuvant radiation to follow surgery  (4) adjuvant antiestrogen to begin at the completion of local therapy  PLAN: Michele Alvarez has no new issues today. The labs were reviewed in detail and while her liver enzymes are still elevated, they continue to trend in the right direction. She will proceed with cycle 4 of paclitaxel as planned today.   Michele Alvarez will return in 1 week for cycle 5 of treatment. She understands and agrees with this plan. She knows the goal of treatment in her case is cure. She has been encouraged to call with any issues that might arise before her next visit here.   Laurie Panda, NP   12/10/2015 10:57 AM

## 2015-12-17 ENCOUNTER — Other Ambulatory Visit (HOSPITAL_BASED_OUTPATIENT_CLINIC_OR_DEPARTMENT_OTHER): Payer: 59

## 2015-12-17 ENCOUNTER — Ambulatory Visit (HOSPITAL_BASED_OUTPATIENT_CLINIC_OR_DEPARTMENT_OTHER): Payer: 59 | Admitting: Oncology

## 2015-12-17 ENCOUNTER — Ambulatory Visit (HOSPITAL_BASED_OUTPATIENT_CLINIC_OR_DEPARTMENT_OTHER): Payer: 59

## 2015-12-17 VITALS — BP 106/73 | HR 92 | Temp 97.8°F | Resp 18 | Ht 68.0 in | Wt 164.8 lb

## 2015-12-17 DIAGNOSIS — R040 Epistaxis: Secondary | ICD-10-CM

## 2015-12-17 DIAGNOSIS — Z5111 Encounter for antineoplastic chemotherapy: Secondary | ICD-10-CM

## 2015-12-17 DIAGNOSIS — C50511 Malignant neoplasm of lower-outer quadrant of right female breast: Secondary | ICD-10-CM | POA: Diagnosis not present

## 2015-12-17 DIAGNOSIS — C773 Secondary and unspecified malignant neoplasm of axilla and upper limb lymph nodes: Secondary | ICD-10-CM

## 2015-12-17 DIAGNOSIS — Z170 Estrogen receptor positive status [ER+]: Secondary | ICD-10-CM

## 2015-12-17 LAB — CBC WITH DIFFERENTIAL/PLATELET
BASO%: 0.8 % (ref 0.0–2.0)
Basophils Absolute: 0 10*3/uL (ref 0.0–0.1)
EOS ABS: 0 10*3/uL (ref 0.0–0.5)
EOS%: 1.1 % (ref 0.0–7.0)
HCT: 33.1 % — ABNORMAL LOW (ref 34.8–46.6)
HEMOGLOBIN: 10.8 g/dL — AB (ref 11.6–15.9)
LYMPH%: 22.2 % (ref 14.0–49.7)
MCH: 31.5 pg (ref 25.1–34.0)
MCHC: 32.5 g/dL (ref 31.5–36.0)
MCV: 96.9 fL (ref 79.5–101.0)
MONO#: 0.4 10*3/uL (ref 0.1–0.9)
MONO%: 12.9 % (ref 0.0–14.0)
NEUT%: 63 % (ref 38.4–76.8)
NEUTROS ABS: 1.9 10*3/uL (ref 1.5–6.5)
PLATELETS: 259 10*3/uL (ref 145–400)
RBC: 3.41 10*6/uL — AB (ref 3.70–5.45)
RDW: 15.4 % — AB (ref 11.2–14.5)
WBC: 2.9 10*3/uL — AB (ref 3.9–10.3)
lymph#: 0.7 10*3/uL — ABNORMAL LOW (ref 0.9–3.3)

## 2015-12-17 LAB — COMPREHENSIVE METABOLIC PANEL
ALBUMIN: 3.8 g/dL (ref 3.5–5.0)
ALK PHOS: 78 U/L (ref 40–150)
ALT: 72 U/L — AB (ref 0–55)
AST: 49 U/L — ABNORMAL HIGH (ref 5–34)
Anion Gap: 7 mEq/L (ref 3–11)
BILIRUBIN TOTAL: 0.46 mg/dL (ref 0.20–1.20)
BUN: 12 mg/dL (ref 7.0–26.0)
CO2: 26 meq/L (ref 22–29)
CREATININE: 0.8 mg/dL (ref 0.6–1.1)
Calcium: 9.6 mg/dL (ref 8.4–10.4)
Chloride: 108 mEq/L (ref 98–109)
GLUCOSE: 111 mg/dL (ref 70–140)
Potassium: 4.3 mEq/L (ref 3.5–5.1)
SODIUM: 142 meq/L (ref 136–145)
TOTAL PROTEIN: 6.3 g/dL — AB (ref 6.4–8.3)

## 2015-12-17 MED ORDER — FAMOTIDINE IN NACL 20-0.9 MG/50ML-% IV SOLN
20.0000 mg | Freq: Once | INTRAVENOUS | Status: AC
  Administered 2015-12-17: 20 mg via INTRAVENOUS

## 2015-12-17 MED ORDER — SODIUM CHLORIDE 0.9 % IV SOLN
Freq: Once | INTRAVENOUS | Status: AC
  Administered 2015-12-17: 12:00:00 via INTRAVENOUS

## 2015-12-17 MED ORDER — FAMOTIDINE IN NACL 20-0.9 MG/50ML-% IV SOLN
INTRAVENOUS | Status: AC
  Filled 2015-12-17: qty 50

## 2015-12-17 MED ORDER — SODIUM CHLORIDE 0.9% FLUSH
10.0000 mL | INTRAVENOUS | Status: DC | PRN
  Administered 2015-12-17: 10 mL
  Filled 2015-12-17: qty 10

## 2015-12-17 MED ORDER — DIPHENHYDRAMINE HCL 50 MG/ML IJ SOLN
25.0000 mg | Freq: Once | INTRAMUSCULAR | Status: AC
  Administered 2015-12-17: 25 mg via INTRAVENOUS

## 2015-12-17 MED ORDER — DIPHENHYDRAMINE HCL 50 MG/ML IJ SOLN
INTRAMUSCULAR | Status: AC
  Filled 2015-12-17: qty 1

## 2015-12-17 MED ORDER — HEPARIN SOD (PORK) LOCK FLUSH 100 UNIT/ML IV SOLN
500.0000 [IU] | Freq: Once | INTRAVENOUS | Status: AC | PRN
  Administered 2015-12-17: 500 [IU]
  Filled 2015-12-17: qty 5

## 2015-12-17 MED ORDER — SODIUM CHLORIDE 0.9 % IV SOLN
4.0000 mg | Freq: Once | INTRAVENOUS | Status: AC
  Administered 2015-12-17: 4 mg via INTRAVENOUS
  Filled 2015-12-17: qty 0.4

## 2015-12-17 MED ORDER — SODIUM CHLORIDE 0.9 % IV SOLN
80.0000 mg/m2 | Freq: Once | INTRAVENOUS | Status: AC
  Administered 2015-12-17: 150 mg via INTRAVENOUS
  Filled 2015-12-17: qty 25

## 2015-12-17 NOTE — Patient Instructions (Signed)
[facility] Discharge Instructions for Patients Receiving Chemotherapy  Today you received the following chemotherapy agents taxol  To help prevent nausea and vomiting after your treatment, we encourage you to take your nausea medication as directed   If you develop nausea and vomiting that is not controlled by your nausea medication, call the clinic.   BELOW ARE SYMPTOMS THAT SHOULD BE REPORTED IMMEDIATELY:  *FEVER GREATER THAN 100.5 F  *CHILLS WITH OR WITHOUT FEVER  NAUSEA AND VOMITING THAT IS NOT CONTROLLED WITH YOUR NAUSEA MEDICATION  *UNUSUAL SHORTNESS OF BREATH  *UNUSUAL BRUISING OR BLEEDING  TENDERNESS IN MOUTH AND THROAT WITH OR WITHOUT PRESENCE OF ULCERS  *URINARY PROBLEMS  *BOWEL PROBLEMS  UNUSUAL RASH Items with * indicate a potential emergency and should be followed up as soon as possible.  Feel free to call the clinic you have any questions or concerns. The clinic phone number is [phone].  

## 2015-12-17 NOTE — Progress Notes (Signed)
[place]  Telephone:(336) [phone] Fax:(336) [phone]   ID: Michele Alvarez DOB: 1961-05-08  MR#: [id number]  VBT#:[id number]  Patient Care Team: Michele Jordan, MD as PCP - General (Family Medicine) Michele Gully, NP as Nurse Practitioner (Obstetrics and Gynecology) Michele Bookbinder, MD as Consulting Physician (General Surgery) Michele Cruel, MD as Consulting Physician (Oncology) Michele Gibson, MD as Attending Physician (Radiation Oncology) Michele Cheese, NP as Nurse Practitioner (Hematology and Oncology) Michele Gamma, MD as Consulting Physician (Neurosurgery) PCP: Michele Coma, MD OTHER MD:  CHIEF COMPLAINT: Node-positive breast cancer  CURRENT TREATMENT: Neoadjuvant chemotherapy  BREAST CANCER HISTORY: From the original intake note:  "Michele Alvarez" had routine screening mammography at [facility] 08/07/2015. There was an area of calcification in the right breast 2:00 position. There was also an area of calcification in the left breast 7:00 position. She was recalled for bilateral diagnostic mammography 08/12/2015. The left breast calcifications were felt to be likely benign and repeat mammography in 6 months was suggested.  On the right, breast density was category D. There was a 1.2 cm irregular mass at the 6:00, inframammary position and also suspicious calcifications. Adding those together the area measured 2.5 cm. Ultrasound of the right breast on the same day found a 1.3 cm lobulated mass in the right breast at the 6:00 position. There was a 1.7 cm axillary lymph node with a fatty hilum.   On 08/30/2015 the patient underwent biopsy of the breast mass, the abnormal lymph node, and the area of calcifications. The calcifications ([id number]) were associated with ductal carcinoma in situ, with the prognostic panel pending. 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 The lymph node also was positive.  Note that the clip to the patient's breast mass did not deploy.  The patient's subsequent history is as detailed below  INTERVAL HISTORY: Michele Alvarez returns for follow up of her estrogen receptor positive breast cancer, accompanied by her partner Michele Alvarez. Today Michele Alvarez is due for cycle 5 of 12 planned weekly doses of paclitaxel. She is tolerating these remarkably well. She has had absolutely no peripheral neuropathies so far. There have been no intercurrent fevers. Reported is working well.  REVIEW OF SYSTEMS: Michele Alvarez usually feels a little bit down day 3 of treatment but bounces back and is working for day weeks at present. She is gained a little bit weight. She's been a little bit constipated. She does have damage to her right foot and right calf from prior back surgeries she says, and that sometimes feels numb, but it is no worse than before. She has had minimal epistaxis. She has rare headaches. A detailed review of systems today was otherwise stable  PAST MEDICAL HISTORY: Past Medical History  Diagnosis Date  . Breast cancer of lower-outer quadrant of right female breast ([place]) 08/19/2015  . Hot flashes   . PONV (postoperative nausea and vomiting)     PAST SURGICAL HISTORY: Past Surgical History  Procedure Laterality Date  . Neck surgery  09/22/1999  . Back surgery      x2, lumbar  . Portacath placement Right 09/09/2015    Procedure: INSERTION PORT-A-CATH WITH [place] ;  Surgeon: Michele Bookbinder, MD;  Location: [place];  Service: General;  Laterality: Right;    FAMILY HISTORY No family history on file. The patient's father died in 2014-09-21 at the age of 48. He had significant COPD. The patient's mother  died at the age of 33 from Parkinson's disease. The patient has 1 brother, 1 sister. There is no cancer history in the family to her knowledge.  GYNECOLOGIC HISTORY:  No LMP recorded. Patient is postmenopausal. Menarche  age 57. She has GI asked P0. She stopped having periods in January 2014. She never took hormone replacement or oral contraceptives.  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 She lives with her partner, Michele Alvarez, and 2 cats.     ADVANCED DIRECTIVES: Not in place. At the 09/01/2015 clinic visit the patient was given the appropriate forms to complete and notarize at her discretion.   HEALTH MAINTENANCE: Social History  Substance Use Topics  . Smoking status: Never Smoker   . Smokeless tobacco: Not on file  . Alcohol Use: Yes     Comment: social     Colonoscopy: Never   PAP:  Bone density: Never  Lipid panel:  Allergies  Allergen Reactions  . Latex Other (See Comments)    Fever blisters  . Acyclovir And Related   . Skin Adhesives Dermatitis    Reaction to dermabond, do not use    Current Outpatient Prescriptions  Medication Sig Dispense Refill  . lidocaine-prilocaine (EMLA) cream Apply to affected area once 30 g 3  . LORazepam (ATIVAN) 0.5 MG tablet Take 1 tablet (0.5 mg total) by mouth every 6 (six) hours as needed (Nausea or vomiting). (Patient not taking: Reported on 10/14/2015) 30 tablet 0  . magnesium hydroxide (MILK OF MAGNESIA) 400 MG/5ML suspension Take 15 mLs by mouth daily as needed for mild constipation. Reported on 12/10/2015    . omeprazole (PRILOSEC) 40 MG capsule Take 1 capsule (40 mg total) by mouth daily. 60 capsule 3  . ondansetron (ZOFRAN) 8 MG tablet Take 1 tablet (8 mg total) by mouth 2 (two) times daily as needed. Start on the third day after chemotherapy. (Patient not taking: Reported on 10/29/2015) 30 tablet 1  . prochlorperazine (COMPAZINE) 10 MG tablet Reported on 12/10/2015     No current facility-administered medications for this visit.    OBJECTIVE: Middle-aged white woman In no acute distress  Filed Vitals:   12/17/15 1111  BP: 106/73  Pulse: 92  Temp: 97.8 F (36.6 C)  Resp: 18     Body mass index is 25.06 kg/(m^2).    ECOG  FS:1 - Symptomatic but completely ambulatory  Sclerae unicteric, pupils round and equal Oropharynx clear and moist-- no thrush or other lesions No cervical or supraclavicular adenopathy Lungs no rales or rhonchi Heart regular rate and rhythm Abd soft, nontender, positive bowel sounds MSK no focal spinal tenderness, no upper extremity lymphedema Neuro: nonfocal, well oriented, appropriate affect Breasts: Deferred   LAB RESULTS:  CMP     Component Value Date/Time   NA 142 12/10/2015 1024   K 4.5 12/10/2015 1024   CO2 28 12/10/2015 1024   GLUCOSE 106 12/10/2015 1024   BUN 15.9 12/10/2015 1024   CREATININE 0.8 12/10/2015 1024   CALCIUM 9.6 12/10/2015 1024   PROT 6.4 12/10/2015 1024   ALBUMIN 3.7 12/10/2015 1024   AST 37* 12/10/2015 1024   ALT 63* 12/10/2015 1024   ALKPHOS 81 12/10/2015 1024   BILITOT 0.38 12/10/2015 1024    INo results found for: SPEP, UPEP  Lab Results  Component Value Date   WBC 2.9* 12/17/2015   NEUTROABS 1.9 12/17/2015   HGB 10.8* 12/17/2015   HCT 33.1* 12/17/2015   MCV 96.9 12/17/2015   PLT 259 12/17/2015  Chemistry      Component Value Date/Time   NA 142 12/10/2015 1024   K 4.5 12/10/2015 1024   CO2 28 12/10/2015 1024   BUN 15.9 12/10/2015 1024   CREATININE 0.8 12/10/2015 1024      Component Value Date/Time   CALCIUM 9.6 12/10/2015 1024   ALKPHOS 81 12/10/2015 1024   AST 37* 12/10/2015 1024   ALT 63* 12/10/2015 1024   BILITOT 0.38 12/10/2015 1024       No results found for: LABCA2  No components found for: LABCA125  No results for input(s): INR in the last 168 hours.  Urinalysis No results found for: COLORURINE, APPEARANCEUR, LABSPEC, PHURINE, GLUCOSEU, HGBUR, BILIRUBINUR, KETONESUR, PROTEINUR, UROBILINOGEN, NITRITE, LEUKOCYTESUR  ELIGIBLE FOR AVAILABLE RESEARCH PROTOCOL: Alliance, PREVENT  STUDIES: No results found.   ASSESSMENT: 55 y.o. [place] woman status post right breast and axillary lymph node biopsy  08/30/2015, both positive for a clinical T1-2 N1, stage II invasive ductal carcinoma, grade 3, estrogen and progesterone receptor positive, HER-2/neu nonamplified, with an MIB-1 of 80%  (1) neoadjuvant chemotherapy started 09/16/2015 consisting of doxorubicin and cyclophosphamide in dose dense fashion 4, Completed 10/28/2015,,  followed by paclitaxel weekly 12  started 11/19/2015  (2) definitive surgery to follow with consideration of the Alliance trial  (3) adjuvant radiation to follow surgery  (4) adjuvant antiestrogens to begin at the completion of local therapy  PLAN: Michele Alvarez is tolerating the Taxol well and we are proceeding to cycle 5 today.   As far as her nosebleeds are concerned I think all she really needs to do is use some saline nasal spray and that may take care of it.  I encouraged her to start an exercise program since she is Concerned about weight gain. I alerted her to problems regarding possible son rash or sun sensitivity.  Otherwise she is already looking forward to scheduling of her surgery and work related problems. I referred those questions to her surgeon Dr. Donne Hazel.  She knows to call for any problems that may develop before her next visit here.  Michele Cruel, MD   12/17/2015 11:39 AM

## 2015-12-24 ENCOUNTER — Other Ambulatory Visit (HOSPITAL_BASED_OUTPATIENT_CLINIC_OR_DEPARTMENT_OTHER): Payer: 59

## 2015-12-24 ENCOUNTER — Ambulatory Visit (HOSPITAL_BASED_OUTPATIENT_CLINIC_OR_DEPARTMENT_OTHER): Payer: 59 | Admitting: Oncology

## 2015-12-24 ENCOUNTER — Ambulatory Visit (HOSPITAL_BASED_OUTPATIENT_CLINIC_OR_DEPARTMENT_OTHER): Payer: 59

## 2015-12-24 VITALS — BP 109/72 | HR 85 | Temp 98.3°F | Resp 18 | Ht 68.0 in | Wt 165.4 lb

## 2015-12-24 DIAGNOSIS — C773 Secondary and unspecified malignant neoplasm of axilla and upper limb lymph nodes: Secondary | ICD-10-CM

## 2015-12-24 DIAGNOSIS — C50511 Malignant neoplasm of lower-outer quadrant of right female breast: Secondary | ICD-10-CM

## 2015-12-24 DIAGNOSIS — Z170 Estrogen receptor positive status [ER+]: Secondary | ICD-10-CM

## 2015-12-24 DIAGNOSIS — C50811 Malignant neoplasm of overlapping sites of right female breast: Secondary | ICD-10-CM

## 2015-12-24 DIAGNOSIS — Z5111 Encounter for antineoplastic chemotherapy: Secondary | ICD-10-CM

## 2015-12-24 DIAGNOSIS — C50812 Malignant neoplasm of overlapping sites of left female breast: Secondary | ICD-10-CM

## 2015-12-24 LAB — COMPREHENSIVE METABOLIC PANEL
ALBUMIN: 3.8 g/dL (ref 3.5–5.0)
ALK PHOS: 70 U/L (ref 40–150)
ALT: 66 U/L — ABNORMAL HIGH (ref 0–55)
AST: 37 U/L — AB (ref 5–34)
Anion Gap: 6 mEq/L (ref 3–11)
BUN: 14.7 mg/dL (ref 7.0–26.0)
CO2: 28 meq/L (ref 22–29)
Calcium: 9.5 mg/dL (ref 8.4–10.4)
Chloride: 108 mEq/L (ref 98–109)
Creatinine: 0.8 mg/dL (ref 0.6–1.1)
EGFR: 83 mL/min/{1.73_m2} — ABNORMAL LOW (ref >90)
GLUCOSE: 98 mg/dL (ref 70–140)
POTASSIUM: 4.2 meq/L (ref 3.5–5.1)
SODIUM: 142 meq/L (ref 136–145)
Total Bilirubin: 0.38 mg/dL (ref 0.20–1.20)
Total Protein: 6.3 g/dL — ABNORMAL LOW (ref 6.4–8.3)

## 2015-12-24 LAB — CBC WITH DIFFERENTIAL/PLATELET
BASO%: 0.8 % (ref 0.0–2.0)
BASOS ABS: 0 10*3/uL (ref 0.0–0.1)
EOS%: 1.3 % (ref 0.0–7.0)
Eosinophils Absolute: 0 10*3/uL (ref 0.0–0.5)
HCT: 33.5 % — ABNORMAL LOW (ref 34.8–46.6)
HEMOGLOBIN: 11 g/dL — AB (ref 11.6–15.9)
LYMPH#: 0.8 10*3/uL — AB (ref 0.9–3.3)
LYMPH%: 28.3 % (ref 14.0–49.7)
MCH: 32 pg (ref 25.1–34.0)
MCHC: 32.7 g/dL (ref 31.5–36.0)
MCV: 97.9 fL (ref 79.5–101.0)
MONO#: 0.3 10*3/uL (ref 0.1–0.9)
MONO%: 10.9 % (ref 0.0–14.0)
NEUT%: 58.7 % (ref 38.4–76.8)
NEUTROS ABS: 1.6 10*3/uL (ref 1.5–6.5)
Platelets: 240 10*3/uL (ref 145–400)
RBC: 3.42 10*6/uL — AB (ref 3.70–5.45)
RDW: 15.1 % — ABNORMAL HIGH (ref 11.2–14.5)
WBC: 2.8 10*3/uL — ABNORMAL LOW (ref 3.9–10.3)

## 2015-12-24 MED ORDER — SODIUM CHLORIDE 0.9 % IV SOLN
4.0000 mg | Freq: Once | INTRAVENOUS | Status: AC
  Administered 2015-12-24: 4 mg via INTRAVENOUS
  Filled 2015-12-24: qty 0.4

## 2015-12-24 MED ORDER — DIPHENHYDRAMINE HCL 50 MG/ML IJ SOLN
25.0000 mg | Freq: Once | INTRAMUSCULAR | Status: AC
  Administered 2015-12-24: 25 mg via INTRAVENOUS

## 2015-12-24 MED ORDER — SODIUM CHLORIDE 0.9 % IV SOLN
Freq: Once | INTRAVENOUS | Status: AC
  Administered 2015-12-24: 13:00:00 via INTRAVENOUS

## 2015-12-24 MED ORDER — FAMOTIDINE IN NACL 20-0.9 MG/50ML-% IV SOLN
20.0000 mg | Freq: Once | INTRAVENOUS | Status: AC
  Administered 2015-12-24: 20 mg via INTRAVENOUS

## 2015-12-24 MED ORDER — DIPHENHYDRAMINE HCL 50 MG/ML IJ SOLN
INTRAMUSCULAR | Status: AC
  Filled 2015-12-24: qty 1

## 2015-12-24 MED ORDER — HEPARIN SOD (PORK) LOCK FLUSH 100 UNIT/ML IV SOLN
500.0000 [IU] | Freq: Once | INTRAVENOUS | Status: AC | PRN
  Administered 2015-12-24: 500 [IU]
  Filled 2015-12-24: qty 5

## 2015-12-24 MED ORDER — FAMOTIDINE IN NACL 20-0.9 MG/50ML-% IV SOLN
INTRAVENOUS | Status: AC
  Filled 2015-12-24: qty 50

## 2015-12-24 MED ORDER — SODIUM CHLORIDE 0.9 % IV SOLN
80.0000 mg/m2 | Freq: Once | INTRAVENOUS | Status: AC
  Administered 2015-12-24: 150 mg via INTRAVENOUS
  Filled 2015-12-24: qty 25

## 2015-12-24 MED ORDER — SODIUM CHLORIDE 0.9% FLUSH
10.0000 mL | INTRAVENOUS | Status: DC | PRN
Start: 2015-12-24 — End: 2015-12-24
  Administered 2015-12-24: 10 mL
  Filled 2015-12-24: qty 10

## 2015-12-24 NOTE — Patient Instructions (Signed)
[facility] Discharge Instructions for Patients Receiving Chemotherapy  Today you received the following chemotherapy agents Taxol   To help prevent nausea and vomiting after your treatment, we encourage you to take your nausea medication as directed.   If you develop nausea and vomiting that is not controlled by your nausea medication, call the clinic.   BELOW ARE SYMPTOMS THAT SHOULD BE REPORTED IMMEDIATELY:  *FEVER GREATER THAN 100.5 F  *CHILLS WITH OR WITHOUT FEVER  NAUSEA AND VOMITING THAT IS NOT CONTROLLED WITH YOUR NAUSEA MEDICATION  *UNUSUAL SHORTNESS OF BREATH  *UNUSUAL BRUISING OR BLEEDING  TENDERNESS IN MOUTH AND THROAT WITH OR WITHOUT PRESENCE OF ULCERS  *URINARY PROBLEMS  *BOWEL PROBLEMS  UNUSUAL RASH Items with * indicate a potential emergency and should be followed up as soon as possible.  Feel free to call the clinic you have any questions or concerns. The clinic phone number is [phone].  Please show the CHEMO ALERT CARD at check-in to the Emergency Department and triage nurse.   

## 2015-12-24 NOTE — Progress Notes (Signed)
[place]  [phone] Fax:(336) [phone]   ID: Michele Alvarez DOB: Mar 05, 1961  MR#: [id number]  GLO#:[id number]  Patient Care Team: Jonathon Jordan, MD as PCP - General (Family Medicine) Avon Gully, NP as Nurse Practitioner (Obstetrics and Gynecology) [name], MD as Consulting Physician (General Surgery) Chauncey Cruel, MD as Consulting Physician (Oncology) Eppie Gibson, MD as Attending Physician (Radiation Oncology) Sylvan Cheese, NP as Nurse Practitioner (Hematology and Oncology) Jovita Gamma, MD as Consulting Physician (Neurosurgery) PCP: Lilian Coma, MD OTHER MD:  CHIEF COMPLAINT: Node-positive breast cancer  CURRENT TREATMENT: Neoadjuvant chemotherapy  BREAST CANCER HISTORY: From the original intake note:  "Michele Alvarez" had routine screening mammography at [facility] 08/07/2015. There was an area of calcification in the right breast 2:00 position. There was also an area of calcification in the left breast 7:00 position. She was recalled for bilateral diagnostic mammography 08/12/2015. The left breast calcifications were felt to be likely benign and repeat mammography in 6 months was suggested.  On the right, breast density was category D. There was a 1.2 cm irregular mass at the 6:00, inframammary position and also suspicious calcifications. Adding those together the area measured 2.5 cm. Ultrasound of the right breast on the same day found a 1.3 cm lobulated mass in the right breast at the 6:00 position. There was a 1.7 cm axillary lymph node with a fatty hilum.   On 08/30/2015 the patient underwent biopsy of the breast mass, the abnormal lymph node, and the area of calcifications. The calcifications ([id number]) were associated with ductal carcinoma in situ, with the prognostic panel pending. 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 The lymph node also was positive.  Note that the clip to the patient's breast mass did not deploy.  The patient's subsequent history is as detailed below  INTERVAL HISTORY: Michele Alvarez returns for follow up of her node positive breast cancer. She is currently receiving weekly Taxol and today he is due for the sixth of 12 planned doses. She generally tolerates treatment well. She has minimal fatigue. She continues to work full-time, 4 days a week. She has had no peripheral neuropathy symptoms so far.  REVIEW OF SYSTEMS: Michele Alvarez does have some discomfort at the base of the nails in the color of the nails is changing a little bit. She has sensitivity to hot water in her hands particularly. Her sense of taste is not affected by this chemotherapy. She has gained a total of 2 pounds since the start of therapy. A detailed review of systems today was otherwise noncontributory  PAST MEDICAL HISTORY: Past Medical History  Diagnosis Date  . Breast cancer of lower-outer quadrant of right female breast ([place]) 08/19/2015  . Hot flashes   . PONV (postoperative nausea and vomiting)     PAST SURGICAL HISTORY: Past Surgical History  Procedure Laterality Date  . Neck surgery  October 02, 1999  . Back surgery      x2, lumbar  . Portacath placement Right 09/09/2015    Procedure: INSERTION PORT-A-CATH WITH [place] ;  Surgeon: [name], MD;  Location: [facility];  Service: General;  Laterality: Right;    FAMILY HISTORY No family history on file. The patient's father died in 10-02-14 at the age of 27. He had significant COPD. The patient's mother died at the age of 77 from Parkinson's disease. The patient has 1 brother, 1 sister. There is  no cancer history in the family to her knowledge.  GYNECOLOGIC HISTORY:  No LMP recorded. Patient is postmenopausal. Menarche age 53. She has GI asked P0. She stopped having periods in January 2014. She never took hormone replacement or oral  contraceptives.  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 She lives with her partner, [name], and 2 cats.     ADVANCED DIRECTIVES: Not in place. At the 09/01/2015 clinic visit the patient was given the appropriate forms to complete and notarize at her discretion.   HEALTH MAINTENANCE: Social History  Substance Use Topics  . Smoking status: Never Smoker   . Smokeless tobacco: Not on file  . Alcohol Use: Yes     Comment: social     Colonoscopy: Never   PAP:  Bone density: Never  Lipid panel:  Allergies  Allergen Reactions  . Latex Other (See Comments)    Fever blisters  . Acyclovir And Related   . Skin Adhesives Dermatitis    Reaction to dermabond, do not use    Current Outpatient Prescriptions  Medication Sig Dispense Refill  . lidocaine-prilocaine (EMLA) cream Apply to affected area once 30 g 3  . LORazepam (ATIVAN) 0.5 MG tablet Take 1 tablet (0.5 mg total) by mouth every 6 (six) hours as needed (Nausea or vomiting). (Patient not taking: Reported on 10/14/2015) 30 tablet 0  . magnesium hydroxide (MILK OF MAGNESIA) 400 MG/5ML suspension Take 15 mLs by mouth daily as needed for mild constipation. Reported on 12/10/2015    . omeprazole (PRILOSEC) 40 MG capsule Take 1 capsule (40 mg total) by mouth daily. 60 capsule 3  . ondansetron (ZOFRAN) 8 MG tablet Take 1 tablet (8 mg total) by mouth 2 (two) times daily as needed. Start on the third day after chemotherapy. (Patient not taking: Reported on 10/29/2015) 30 tablet 1  . prochlorperazine (COMPAZINE) 10 MG tablet Reported on 12/10/2015     No current facility-administered medications for this visit.    OBJECTIVE: Middle-aged white woman Who appears well  Filed Vitals:   12/24/15 1113  BP: 109/72  Pulse: 85  Temp: 98.3 F (36.8 C)  Resp: 18     Body mass index is 25.15 kg/(m^2).    ECOG FS:0 - Asymptomatic  Sclerae unicteric, EOMs intact Oropharynx clear, dentition in good repair No cervical or  supraclavicular adenopathy Lungs no rales or rhonchi Heart regular rate and rhythm Abd soft, nontender, positive bowel sounds MSK no focal spinal tenderness, no upper extremity lymphedema Neuro: nonfocal, well oriented, appropriate affect Breasts: Deferred    LAB RESULTS:  CMP     Component Value Date/Time   NA 142 12/17/2015 1101   K 4.3 12/17/2015 1101   CO2 26 12/17/2015 1101   GLUCOSE 111 12/17/2015 1101   BUN 12.0 12/17/2015 1101   CREATININE 0.8 12/17/2015 1101   CALCIUM 9.6 12/17/2015 1101   PROT 6.3* 12/17/2015 1101   ALBUMIN 3.8 12/17/2015 1101   AST 49* 12/17/2015 1101   ALT 72* 12/17/2015 1101   ALKPHOS 78 12/17/2015 1101   BILITOT 0.46 12/17/2015 1101    INo results found for: SPEP, UPEP  Lab Results  Component Value Date   WBC 2.8* 12/24/2015   NEUTROABS 1.6 12/24/2015   HGB 11.0* 12/24/2015   HCT 33.5* 12/24/2015   MCV 97.9 12/24/2015   PLT 240 12/24/2015      Chemistry      Component Value Date/Time   NA 142 12/17/2015 1101   K 4.3 12/17/2015 1101  CO2 26 12/17/2015 1101   BUN 12.0 12/17/2015 1101   CREATININE 0.8 12/17/2015 1101      Component Value Date/Time   CALCIUM 9.6 12/17/2015 1101   ALKPHOS 78 12/17/2015 1101   AST 49* 12/17/2015 1101   ALT 72* 12/17/2015 1101   BILITOT 0.46 12/17/2015 1101       No results found for: LABCA2  No components found for: LABCA125  No results for input(s): INR in the last 168 hours.  Urinalysis No results found for: COLORURINE, APPEARANCEUR, LABSPEC, PHURINE, GLUCOSEU, HGBUR, BILIRUBINUR, KETONESUR, PROTEINUR, UROBILINOGEN, NITRITE, LEUKOCYTESUR  ELIGIBLE FOR AVAILABLE RESEARCH PROTOCOL: Alliance, PREVENT  STUDIES: No results found.   ASSESSMENT: 55 y.o. [place] woman status post right breast and axillary lymph node biopsy 08/30/2015, both positive for a clinical T1-2 N1, stage II invasive ductal carcinoma, grade 3, estrogen and progesterone receptor positive, HER-2/neu nonamplified,  with an MIB-1 of 80%  (1) neoadjuvant chemotherapy started 09/16/2015 consisting of doxorubicin and cyclophosphamide in dose dense fashion 4, completed 10/28/2015, followed by paclitaxel weekly 12  started 11/19/2015  (2) definitive surgery to follow with consideration of the Alliance trial  (3) adjuvant radiation to follow surgery  (4) adjuvant antiestrogens to begin at the completion of local therapy  PLAN: Michele Alvarez continues to tolerate the Taxol well, and specifically is not having any significant peripheral neuropathy.  The discomfort she is having on the dorsal aspect of her fingers, at the base of the nails, is chemotherapy related, but it is not neuropathic and completely resolves when she stops having treatment.  Today we discussed the Alliance trial. I strongly encouraged her to participate. She will discuss that further with Dr. Donne Hazel.  We are proceeding with cycle #6 today. She knows to call for any problems that may develop before her next visit here. Chauncey Cruel, MD   12/24/2015 11:50 AM

## 2015-12-31 ENCOUNTER — Ambulatory Visit (HOSPITAL_BASED_OUTPATIENT_CLINIC_OR_DEPARTMENT_OTHER): Payer: 59 | Admitting: Oncology

## 2015-12-31 ENCOUNTER — Other Ambulatory Visit (HOSPITAL_BASED_OUTPATIENT_CLINIC_OR_DEPARTMENT_OTHER): Payer: 59

## 2015-12-31 ENCOUNTER — Ambulatory Visit (HOSPITAL_BASED_OUTPATIENT_CLINIC_OR_DEPARTMENT_OTHER): Payer: 59

## 2015-12-31 VITALS — BP 101/71 | HR 99 | Temp 98.2°F | Resp 18 | Wt 166.5 lb

## 2015-12-31 DIAGNOSIS — C50511 Malignant neoplasm of lower-outer quadrant of right female breast: Secondary | ICD-10-CM | POA: Diagnosis not present

## 2015-12-31 DIAGNOSIS — C773 Secondary and unspecified malignant neoplasm of axilla and upper limb lymph nodes: Secondary | ICD-10-CM

## 2015-12-31 DIAGNOSIS — Z5111 Encounter for antineoplastic chemotherapy: Secondary | ICD-10-CM

## 2015-12-31 DIAGNOSIS — Z170 Estrogen receptor positive status [ER+]: Secondary | ICD-10-CM | POA: Diagnosis not present

## 2015-12-31 LAB — CBC WITH DIFFERENTIAL/PLATELET
BASO%: 0.4 % (ref 0.0–2.0)
BASOS ABS: 0 10*3/uL (ref 0.0–0.1)
EOS ABS: 0 10*3/uL (ref 0.0–0.5)
EOS%: 0.5 % (ref 0.0–7.0)
HCT: 35.2 % (ref 34.8–46.6)
HGB: 11.5 g/dL — ABNORMAL LOW (ref 11.6–15.9)
LYMPH%: 14.9 % (ref 14.0–49.7)
MCH: 32.3 pg (ref 25.1–34.0)
MCHC: 32.6 g/dL (ref 31.5–36.0)
MCV: 99 fL (ref 79.5–101.0)
MONO#: 0.3 10*3/uL (ref 0.1–0.9)
MONO%: 6 % (ref 0.0–14.0)
NEUT%: 78.2 % — AB (ref 38.4–76.8)
NEUTROS ABS: 3.7 10*3/uL (ref 1.5–6.5)
PLATELETS: 243 10*3/uL (ref 145–400)
RBC: 3.55 10*6/uL — AB (ref 3.70–5.45)
RDW: 14.8 % — ABNORMAL HIGH (ref 11.2–14.5)
WBC: 4.7 10*3/uL (ref 3.9–10.3)
lymph#: 0.7 10*3/uL — ABNORMAL LOW (ref 0.9–3.3)

## 2015-12-31 LAB — COMPREHENSIVE METABOLIC PANEL
ALK PHOS: 72 U/L (ref 40–150)
ALT: 76 U/L — ABNORMAL HIGH (ref 0–55)
ANION GAP: 7 meq/L (ref 3–11)
AST: 48 U/L — ABNORMAL HIGH (ref 5–34)
Albumin: 3.9 g/dL (ref 3.5–5.0)
BILIRUBIN TOTAL: 0.54 mg/dL (ref 0.20–1.20)
BUN: 11.9 mg/dL (ref 7.0–26.0)
CO2: 28 meq/L (ref 22–29)
Calcium: 9.6 mg/dL (ref 8.4–10.4)
Chloride: 107 mEq/L (ref 98–109)
Creatinine: 0.8 mg/dL (ref 0.6–1.1)
EGFR: 87 mL/min/{1.73_m2} — AB (ref >90)
Glucose: 98 mg/dl (ref 70–140)
POTASSIUM: 4.5 meq/L (ref 3.5–5.1)
SODIUM: 143 meq/L (ref 136–145)
TOTAL PROTEIN: 6.4 g/dL (ref 6.4–8.3)

## 2015-12-31 MED ORDER — SODIUM CHLORIDE 0.9 % IV SOLN
80.0000 mg/m2 | Freq: Once | INTRAVENOUS | Status: AC
  Administered 2015-12-31: 150 mg via INTRAVENOUS
  Filled 2015-12-31: qty 25

## 2015-12-31 MED ORDER — FAMOTIDINE IN NACL 20-0.9 MG/50ML-% IV SOLN
INTRAVENOUS | Status: AC
Start: 2015-12-31 — End: 2015-12-31
  Filled 2015-12-31: qty 50

## 2015-12-31 MED ORDER — HEPARIN SOD (PORK) LOCK FLUSH 100 UNIT/ML IV SOLN
500.0000 [IU] | Freq: Once | INTRAVENOUS | Status: DC | PRN
  Filled 2015-12-31: qty 5

## 2015-12-31 MED ORDER — DIPHENHYDRAMINE HCL 50 MG/ML IJ SOLN
INTRAMUSCULAR | Status: AC
  Filled 2015-12-31: qty 1

## 2015-12-31 MED ORDER — FAMOTIDINE IN NACL 20-0.9 MG/50ML-% IV SOLN
20.0000 mg | Freq: Once | INTRAVENOUS | Status: AC
  Administered 2015-12-31: 20 mg via INTRAVENOUS

## 2015-12-31 MED ORDER — SODIUM CHLORIDE 0.9 % IV SOLN
4.0000 mg | Freq: Once | INTRAVENOUS | Status: AC
  Administered 2015-12-31: 4 mg via INTRAVENOUS
  Filled 2015-12-31: qty 0.4

## 2015-12-31 MED ORDER — SODIUM CHLORIDE 0.9% FLUSH
10.0000 mL | INTRAVENOUS | Status: DC | PRN
  Filled 2015-12-31: qty 10

## 2015-12-31 MED ORDER — SODIUM CHLORIDE 0.9 % IV SOLN
Freq: Once | INTRAVENOUS | Status: AC
  Administered 2015-12-31: 12:00:00 via INTRAVENOUS

## 2015-12-31 MED ORDER — DIPHENHYDRAMINE HCL 50 MG/ML IJ SOLN
25.0000 mg | Freq: Once | INTRAMUSCULAR | Status: AC
  Administered 2015-12-31: 25 mg via INTRAVENOUS

## 2015-12-31 NOTE — Progress Notes (Signed)
[place]  [phone] Fax:(336) [phone]   ID: Charlestine Rookstool Darnold DOB: 1961-01-20  MR#: [id number]  ZSW#:[id number]  Patient Care Team: Jonathon Jordan, MD as PCP - General (Family Medicine) Avon Gully, NP as Nurse Practitioner (Obstetrics and Gynecology) [name], MD as Consulting Physician (General Surgery) Chauncey Cruel, MD as Consulting Physician (Oncology) Eppie Gibson, MD as Attending Physician (Radiation Oncology) Sylvan Cheese, NP as Nurse Practitioner (Hematology and Oncology) Jovita Gamma, MD as Consulting Physician (Neurosurgery) PCP: Lilian Coma, MD OTHER MD:  CHIEF COMPLAINT: Node-positive breast cancer  CURRENT TREATMENT: Neoadjuvant chemotherapy  BREAST CANCER HISTORY: From the original intake note:  "[name]" had routine screening mammography at [facility] 08/07/2015. There was an area of calcification in the right breast 2:00 position. There was also an area of calcification in the left breast 7:00 position. She was recalled for bilateral diagnostic mammography 08/12/2015. The left breast calcifications were felt to be likely benign and repeat mammography in 6 months was suggested.  On the right, breast density was category D. There was a 1.2 cm irregular mass at the 6:00, inframammary position and also suspicious calcifications. Adding those together the area measured 2.5 cm. Ultrasound of the right breast on the same day found a 1.3 cm lobulated mass in the right breast at the 6:00 position. There was a 1.7 cm axillary lymph node with a fatty hilum.   On 08/30/2015 the patient underwent biopsy of the breast mass, the abnormal lymph node, and the area of calcifications. The calcifications ([id number]) were associated with ductal carcinoma in situ, with the prognostic panel pending. 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 The lymph node also was positive.  Note that the clip to the patient's breast mass did not deploy.  The patient's subsequent history is as detailed below  INTERVAL HISTORY: Eustaquio Maize returns for follow up of her estrogen receptor positive breast cancer accompanied by her partner [name]. [name] continues on weekly Taxol and today she is due for the 7th of 12 planned doses.   She has had a sensation like her fingers are "emanating heat". The third digit of the right hand has been a little numb at times but it is not numb now. She has normal sensation in her fingertips. She continues to have some sensitivity at the nailbeds. Toes are fine.  REVIEW OF SYSTEMS: Eustaquio Maize feels minimally fatigued from her chemotherapy. She is working full-time. She is not otherwise exercising but she says she walks a lot at her job area she has a fit bit which she currently is not using. For the past couple of days she has had a little bit of a sore throat or more little throat congestion. She keeps trying to clear her throat. This is not a cough, it is not nausea or vomiting. It may be related to seasonal allergies. As apart from this a detailed review of systems today was noncontributory  PAST MEDICAL HISTORY: Past Medical History  Diagnosis Date  . Breast cancer of lower-outer quadrant of right female breast ([place]) 08/19/2015  . Hot flashes   . PONV (postoperative nausea and vomiting)     PAST SURGICAL HISTORY: Past Surgical History  Procedure Laterality Date  . Neck surgery  2001  . Back surgery      x2, lumbar  . Portacath placement Right 09/09/2015    Procedure: INSERTION PORT-A-CATH WITH [place] ;  Surgeon: [name], MD;  Location: [facility];  Service: General;  Laterality: Right;    FAMILY HISTORY No family history on file. The patient's father died in 2014/09/18 at the age of 6. He had significant COPD. The patient's mother died at the age of 75 from Parkinson's  disease. The patient has 1 brother, 1 sister. There is no cancer history in the family to her knowledge.  GYNECOLOGIC HISTORY:  No LMP recorded. Patient is postmenopausal. Menarche age 3. She has GI asked P0. She stopped having periods in January 2014. She never took hormone replacement or oral contraceptives.  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 She lives with her partner, [name], and 2 cats.     ADVANCED DIRECTIVES: Not in place. At the 09/01/2015 clinic visit the patient was given the appropriate forms to complete and notarize at her discretion.   HEALTH MAINTENANCE: Social History  Substance Use Topics  . Smoking status: Never Smoker   . Smokeless tobacco: Not on file  . Alcohol Use: Yes     Comment: social     Colonoscopy: Never   PAP:  Bone density: Never  Lipid panel:  Allergies  Allergen Reactions  . Latex Other (See Comments)    Fever blisters  . Acyclovir And Related   . Skin Adhesives Dermatitis    Reaction to dermabond, do not use    Current Outpatient Prescriptions  Medication Sig Dispense Refill  . lidocaine-prilocaine (EMLA) cream Apply to affected area once 30 g 3  . magnesium hydroxide (MILK OF MAGNESIA) 400 MG/5ML suspension Take 15 mLs by mouth daily as needed for mild constipation. Reported on 12/10/2015    . omeprazole (PRILOSEC) 40 MG capsule Take 1 capsule (40 mg total) by mouth daily. 60 capsule 3  . prochlorperazine (COMPAZINE) 10 MG tablet Reported on 12/10/2015     No current facility-administered medications for this visit.    OBJECTIVE: Middle-aged white woman In no acute distress Filed Vitals:   12/31/15 1031  BP: 101/71  Pulse: 99  Temp: 98.2 F (36.8 C)  Resp: 18     Body mass index is 25.32 kg/(m^2).    ECOG FS:0 - Asymptomatic  Sclerae unicteric, pupils round and equal Oropharynx clear and moist-- no thrush or other lesions No cervical or supraclavicular adenopathy Lungs no rales or rhonchi Heart  regular rate and rhythm Abd soft, nontender, positive bowel sounds MSK no focal spinal tenderness, no upper extremity lymphedema Neuro: nonfocal, well oriented, appropriate affect Breasts: Deferred   LAB RESULTS:  CMP     Component Value Date/Time   NA 143 12/31/2015 1012   K 4.5 12/31/2015 1012   CO2 28 12/31/2015 1012   GLUCOSE 98 12/31/2015 1012   BUN 11.9 12/31/2015 1012   CREATININE 0.8 12/31/2015 1012   CALCIUM 9.6 12/31/2015 1012   PROT 6.4 12/31/2015 1012   ALBUMIN 3.9 12/31/2015 1012   AST 48* 12/31/2015 1012   ALT 76* 12/31/2015 1012   ALKPHOS 72 12/31/2015 1012   BILITOT 0.54 12/31/2015 1012    INo results found for: SPEP, UPEP  Lab Results  Component Value Date   WBC 4.7 12/31/2015   NEUTROABS 3.7 12/31/2015   HGB 11.5* 12/31/2015   HCT 35.2 12/31/2015   MCV 99.0 12/31/2015   PLT 243 12/31/2015      Chemistry      Component Value Date/Time   NA 143 12/31/2015 1012   K 4.5 12/31/2015 1012   CO2 28  12/31/2015 1012   BUN 11.9 12/31/2015 1012   CREATININE 0.8 12/31/2015 1012      Component Value Date/Time   CALCIUM 9.6 12/31/2015 1012   ALKPHOS 72 12/31/2015 1012   AST 48* 12/31/2015 1012   ALT 76* 12/31/2015 1012   BILITOT 0.54 12/31/2015 1012       No results found for: LABCA2  No components found for: LABCA125  No results for input(s): INR in the last 168 hours.  Urinalysis No results found for: COLORURINE, APPEARANCEUR, LABSPEC, PHURINE, GLUCOSEU, HGBUR, BILIRUBINUR, KETONESUR, PROTEINUR, UROBILINOGEN, NITRITE, LEUKOCYTESUR  ELIGIBLE FOR AVAILABLE RESEARCH PROTOCOL: Alliance, PREVENT  STUDIES: No results found.   ASSESSMENT: 55 y.o. [place] woman status post right breast Lower outer quadrant and axillary lymph node biopsy 08/30/2015, both positive for a clinical T1-2 N1, stage II invasive ductal carcinoma, grade 3, estrogen and progesterone receptor positive, HER-2/neu nonamplified, with an MIB-1 of 80%  (1) neoadjuvant  chemotherapy started 09/16/2015 consisting of doxorubicin and cyclophosphamide in dose dense fashion 4, completed 10/28/2015, followed by paclitaxel weekly 12  started 11/19/2015  (2) definitive surgery to follow with consideration of the Alliance trial  (3) adjuvant radiation to follow surgery  (4) adjuvant antiestrogens to begin at the completion of local therapy  PLAN: [name] Is having some subtle symptoms consistent with possibly early peripheral neuropathy from her chemotherapy, but this is very minimal and actually today she has no numbness no tingling and no sensitivity in her fingertips. Accordingly we are proceeding with treatment today.  It would not surprise me if next week we would have to postpone therapy. She is aware of this.  I am not sure why she has the sensation of bloating. Exam is normal. Her liver function tests are minimally elevated and stable. We are going to give Gas-X a try.  She is already scheduled to see me again in 1 week. She knows to call for any problems that may develop before that visit.Marland Kitchen Chauncey Cruel, MD   12/31/2015 10:49 AM

## 2015-12-31 NOTE — Patient Instructions (Signed)
[facility] Discharge Instructions for Patients Receiving Chemotherapy  Today you received the following chemotherapy agents:  Taxol  To help prevent nausea and vomiting after your treatment, we encourage you to take your nausea medication as prescribed.   If you develop nausea and vomiting that is not controlled by your nausea medication, call the clinic.   BELOW ARE SYMPTOMS THAT SHOULD BE REPORTED IMMEDIATELY:  *FEVER GREATER THAN 100.5 F  *CHILLS WITH OR WITHOUT FEVER  NAUSEA AND VOMITING THAT IS NOT CONTROLLED WITH YOUR NAUSEA MEDICATION  *UNUSUAL SHORTNESS OF BREATH  *UNUSUAL BRUISING OR BLEEDING  TENDERNESS IN MOUTH AND THROAT WITH OR WITHOUT PRESENCE OF ULCERS  *URINARY PROBLEMS  *BOWEL PROBLEMS  UNUSUAL RASH Items with * indicate a potential emergency and should be followed up as soon as possible.  Feel free to call the clinic you have any questions or concerns. The clinic phone number is [phone].  Please show the CHEMO ALERT CARD at check-in to the Emergency Department and triage nurse.   

## 2016-01-03 ENCOUNTER — Other Ambulatory Visit: Payer: Self-pay | Admitting: *Deleted

## 2016-01-03 DIAGNOSIS — C50511 Malignant neoplasm of lower-outer quadrant of right female breast: Secondary | ICD-10-CM

## 2016-01-03 MED ORDER — AZITHROMYCIN 250 MG PO TABS: ORAL_TABLET | ORAL | Status: DC

## 2016-01-03 NOTE — Telephone Encounter (Signed)
Received call from patient stating she has a lot of congestion again with thick green drainage.  No fever.  Per Dr. Jana Hakim call in [place].

## 2016-01-07 ENCOUNTER — Encounter: Payer: Self-pay | Admitting: *Deleted

## 2016-01-07 ENCOUNTER — Ambulatory Visit: Payer: 59

## 2016-01-07 ENCOUNTER — Other Ambulatory Visit (HOSPITAL_BASED_OUTPATIENT_CLINIC_OR_DEPARTMENT_OTHER): Payer: 59

## 2016-01-07 ENCOUNTER — Ambulatory Visit (HOSPITAL_BASED_OUTPATIENT_CLINIC_OR_DEPARTMENT_OTHER): Payer: 59 | Admitting: Oncology

## 2016-01-07 ENCOUNTER — Telehealth: Payer: Self-pay | Admitting: Oncology

## 2016-01-07 VITALS — BP 109/78 | HR 92 | Temp 98.8°F | Resp 18 | Ht 68.0 in | Wt 167.2 lb

## 2016-01-07 DIAGNOSIS — C50511 Malignant neoplasm of lower-outer quadrant of right female breast: Secondary | ICD-10-CM

## 2016-01-07 DIAGNOSIS — C773 Secondary and unspecified malignant neoplasm of axilla and upper limb lymph nodes: Secondary | ICD-10-CM | POA: Diagnosis not present

## 2016-01-07 DIAGNOSIS — Z170 Estrogen receptor positive status [ER+]: Secondary | ICD-10-CM

## 2016-01-07 DIAGNOSIS — G629 Polyneuropathy, unspecified: Secondary | ICD-10-CM

## 2016-01-07 LAB — CBC WITH DIFFERENTIAL/PLATELET
BASO%: 1 % (ref 0.0–2.0)
Basophils Absolute: 0 10*3/uL (ref 0.0–0.1)
EOS%: 1.9 % (ref 0.0–7.0)
Eosinophils Absolute: 0.1 10*3/uL (ref 0.0–0.5)
HEMATOCRIT: 35.8 % (ref 34.8–46.6)
HEMOGLOBIN: 11.7 g/dL (ref 11.6–15.9)
LYMPH#: 0.8 10*3/uL — AB (ref 0.9–3.3)
LYMPH%: 27.8 % (ref 14.0–49.7)
MCH: 32.1 pg (ref 25.1–34.0)
MCHC: 32.5 g/dL (ref 31.5–36.0)
MCV: 98.8 fL (ref 79.5–101.0)
MONO#: 0.3 10*3/uL (ref 0.1–0.9)
MONO%: 9.4 % (ref 0.0–14.0)
NEUT%: 59.9 % (ref 38.4–76.8)
NEUTROS ABS: 1.8 10*3/uL (ref 1.5–6.5)
Platelets: 257 10*3/uL (ref 145–400)
RBC: 3.63 10*6/uL — ABNORMAL LOW (ref 3.70–5.45)
RDW: 14.6 % — AB (ref 11.2–14.5)
WBC: 2.9 10*3/uL — AB (ref 3.9–10.3)

## 2016-01-07 LAB — COMPREHENSIVE METABOLIC PANEL
ALBUMIN: 3.7 g/dL (ref 3.5–5.0)
ALK PHOS: 90 U/L (ref 40–150)
ALT: 75 U/L — AB (ref 0–55)
AST: 41 U/L — AB (ref 5–34)
Anion Gap: 7 mEq/L (ref 3–11)
BILIRUBIN TOTAL: 0.36 mg/dL (ref 0.20–1.20)
BUN: 12.7 mg/dL (ref 7.0–26.0)
CO2: 30 mEq/L — ABNORMAL HIGH (ref 22–29)
CREATININE: 0.8 mg/dL (ref 0.6–1.1)
Calcium: 9.8 mg/dL (ref 8.4–10.4)
Chloride: 107 mEq/L (ref 98–109)
EGFR: 89 mL/min/{1.73_m2} — ABNORMAL LOW (ref >90)
GLUCOSE: 95 mg/dL (ref 70–140)
Potassium: 4.2 mEq/L (ref 3.5–5.1)
SODIUM: 143 meq/L (ref 136–145)
TOTAL PROTEIN: 6.5 g/dL (ref 6.4–8.3)

## 2016-01-07 NOTE — Telephone Encounter (Signed)
appt made and avs printed °

## 2016-01-07 NOTE — Progress Notes (Signed)
[place]  [phone] Fax:(336) [phone]   ID: Michele Alvarez DOB: 20-Sep-1960  MR#: [id number]  JYN#:[id number]  Patient Care Team: Jonathon Jordan, MD as PCP - General (Family Medicine) Avon Gully, NP as Nurse Practitioner (Obstetrics and Gynecology) [name], MD as Consulting Physician (General Surgery) Chauncey Cruel, MD as Consulting Physician (Oncology) Eppie Gibson, MD as Attending Physician (Radiation Oncology) Sylvan Cheese, NP as Nurse Practitioner (Hematology and Oncology) Jovita Gamma, MD as Consulting Physician (Neurosurgery) PCP: Lilian Coma, MD OTHER MD:  CHIEF COMPLAINT: Node-positive breast cancer  CURRENT TREATMENT: Neoadjuvant chemotherapy  BREAST CANCER HISTORY: From the original intake note:  "Michele Alvarez" had routine screening mammography at [facility] 08/07/2015. There was an area of calcification in the right breast 2:00 position. There was also an area of calcification in the left breast 7:00 position. She was recalled for bilateral diagnostic mammography 08/12/2015. The left breast calcifications were felt to be likely benign and repeat mammography in 6 months was suggested.  On the right, breast density was category D. There was a 1.2 cm irregular mass at the 6:00, inframammary position and also suspicious calcifications. Adding those together the area measured 2.5 cm. Ultrasound of the right breast on the same day found a 1.3 cm lobulated mass in the right breast at the 6:00 position. There was a 1.7 cm axillary lymph node with a fatty hilum.   On 08/30/2015 the patient underwent biopsy of the breast mass, the abnormal lymph node, and the area of calcifications. The calcifications ([id number]) were associated with ductal carcinoma in situ, with the prognostic panel pending. 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 The lymph node also was positive.  Note that the clip to the patient's breast mass did not deploy.  The patient's subsequent history is as detailed below  INTERVAL HISTORY: Michele Alvarez returns for follow up of her early stage breast cancer. Michele Alvarez is being treated with weekly Taxol and today she is due for the 8th of 12 planned doses.   However, she has had a sinus infection for the past several days. This has "knocked her down". She has been able to drive herself to work, but has not been able to do much more than that. She is completing a Z-Pak tomorrow.  As far as neuropathy is concerned she continues to have significant discomfort at the nailbeds, which is not neuropathy, but minimal numbness and no tingling on the pads of her fingertips. She has no neuropathy at all involving the toes.Marland Kitchen  REVIEW OF SYSTEMS: A detailed review of systems today was otherwise stable.  PAST MEDICAL HISTORY: Past Medical History  Diagnosis Date  . Breast cancer of lower-outer quadrant of right female breast ([place]) 08/19/2015  . Hot flashes   . PONV (postoperative nausea and vomiting)     PAST SURGICAL HISTORY: Past Surgical History  Procedure Laterality Date  . Neck surgery  September 25, 1999  . Back surgery      x2, lumbar  . Portacath placement Right 09/09/2015    Procedure: INSERTION PORT-A-CATH WITH [place] ;  Surgeon: [name], MD;  Location: [facility];  Service: General;  Laterality: Right;    FAMILY HISTORY No family history on file. The patient's father died in 24-Sep-2014 at the age of 68. He had significant COPD. The patient's mother died at the age of 56 from Parkinson's disease. The patient has 1  brother, 1 sister. There is no cancer history in the family to her knowledge.  GYNECOLOGIC HISTORY:  No LMP recorded. Patient is postmenopausal. Menarche age 46. She has GI asked P0. She stopped having periods in January 2014. She never took hormone replacement or oral  contraceptives.  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 She lives with her partner, [name], and 2 cats.     ADVANCED DIRECTIVES: Not in place. At the 09/01/2015 clinic visit the patient was given the appropriate forms to complete and notarize at her discretion.   HEALTH MAINTENANCE: Social History  Substance Use Topics  . Smoking status: Never Smoker   . Smokeless tobacco: Not on file  . Alcohol Use: Yes     Comment: social     Colonoscopy: Never   PAP:  Bone density: Never  Lipid panel:  Allergies  Allergen Reactions  . Latex Other (See Comments)    Fever blisters  . Acyclovir And Related   . Skin Adhesives Dermatitis    Reaction to dermabond, do not use    Current Outpatient Prescriptions  Medication Sig Dispense Refill  . azithromycin (ZITHROMAX Z-PAK) 250 MG tablet Take as directed 6 each 0  . lidocaine-prilocaine (EMLA) cream Apply to affected area once 30 g 3  . magnesium hydroxide (MILK OF MAGNESIA) 400 MG/5ML suspension Take 15 mLs by mouth daily as needed for mild constipation. Reported on 12/10/2015    . omeprazole (PRILOSEC) 40 MG capsule Take 1 capsule (40 mg total) by mouth daily. 60 capsule 3  . prochlorperazine (COMPAZINE) 10 MG tablet Reported on 12/10/2015     No current facility-administered medications for this visit.    OBJECTIVE: Middle-aged white woman Who appears stated age  55 Vitals:   01/07/16 1050  BP: 109/78  Pulse: 92  Temp: 98.8 F (37.1 C)  Resp: 18     Body mass index is 25.43 kg/(m^2).    ECOG FS:1 - Symptomatic but completely ambulatory  Sclerae unicteric, EOMs intact Oropharynx clear, dentition in good repair No cervical or supraclavicular adenopathy Lungs no rales or rhonchi Heart regular rate and rhythm Abd soft, nontender, positive bowel sounds MSK no focal spinal tenderness, no upper extremity lymphedema Neuro: nonfocal, well oriented, appropriate affect Breasts: Deferred   LAB  RESULTS:  CMP     Component Value Date/Time   NA 143 01/07/2016 1033   K 4.2 01/07/2016 1033   CO2 30* 01/07/2016 1033   GLUCOSE 95 01/07/2016 1033   BUN 12.7 01/07/2016 1033   CREATININE 0.8 01/07/2016 1033   CALCIUM 9.8 01/07/2016 1033   PROT 6.5 01/07/2016 1033   ALBUMIN 3.7 01/07/2016 1033   AST 41* 01/07/2016 1033   ALT 75* 01/07/2016 1033   ALKPHOS 90 01/07/2016 1033   BILITOT 0.36 01/07/2016 1033    INo results found for: SPEP, UPEP  Lab Results  Component Value Date   WBC 2.9* 01/07/2016   NEUTROABS 1.8 01/07/2016   HGB 11.7 01/07/2016   HCT 35.8 01/07/2016   MCV 98.8 01/07/2016   PLT 257 01/07/2016      Chemistry      Component Value Date/Time   NA 143 01/07/2016 1033   K 4.2 01/07/2016 1033   CO2 30* 01/07/2016 1033   BUN 12.7 01/07/2016 1033   CREATININE 0.8 01/07/2016 1033      Component Value Date/Time   CALCIUM 9.8 01/07/2016 1033   ALKPHOS 90 01/07/2016 1033   AST 41* 01/07/2016 1033   ALT 75*  01/07/2016 1033   BILITOT 0.36 01/07/2016 1033       No results found for: LABCA2  No components found for: ZZCKI217  No results for input(s): INR in the last 168 hours.  Urinalysis No results found for: COLORURINE, APPEARANCEUR, LABSPEC, PHURINE, GLUCOSEU, HGBUR, BILIRUBINUR, KETONESUR, PROTEINUR, UROBILINOGEN, NITRITE, LEUKOCYTESUR  ELIGIBLE FOR AVAILABLE RESEARCH PROTOCOL: Alliance, PREVENT  STUDIES: No results found.   ASSESSMENT: 55 y.o. [place] woman status post right breast Lower outer quadrant and axillary lymph node biopsy 08/30/2015, both positive for a clinical T1-2 N1, stage II invasive ductal carcinoma, grade 3, estrogen and progesterone receptor positive, HER-2/neu nonamplified, with an MIB-1 of 80%  (1) neoadjuvant chemotherapy started 09/16/2015 consisting of doxorubicin and cyclophosphamide in dose dense fashion 4, completed 10/28/2015, followed by paclitaxel weekly 12  started 11/19/2015  (2) definitive surgery to follow  with consideration of the Alliance trial  (3) adjuvant radiation to follow surgery  (4) adjuvant antiestrogens to begin at the completion of local therapy  PLAN: Michele Alvarez is on antibiotics, his generally run down, and her counts are slightly down. While we could potentially push on with treatment this week, my expectation is likely we would have to interrupt next week and it is better to give her a week off at this point. I think she will be able to complete the rest of her treatments more easily and we will make up the missed treatment at the end.  She is agreeable to this plan. Accordingly she will not be treated today. She will see me again in a week to receive her eighth dose of Taxol. This will also give [place] a little extra time to further assess her neuropathy, which at this point appears to be minimal and not clearly progressive.  She knows to call for any problems that may develop before that visit.Marland Kitchen Chauncey Cruel, MD   01/07/2016 11:20 AM

## 2016-01-14 ENCOUNTER — Ambulatory Visit (HOSPITAL_BASED_OUTPATIENT_CLINIC_OR_DEPARTMENT_OTHER): Payer: 59 | Admitting: Oncology

## 2016-01-14 ENCOUNTER — Ambulatory Visit (HOSPITAL_BASED_OUTPATIENT_CLINIC_OR_DEPARTMENT_OTHER): Payer: 59

## 2016-01-14 ENCOUNTER — Other Ambulatory Visit (HOSPITAL_BASED_OUTPATIENT_CLINIC_OR_DEPARTMENT_OTHER): Payer: 59

## 2016-01-14 VITALS — BP 111/76 | HR 84 | Temp 98.5°F | Resp 18 | Ht 68.0 in | Wt 166.4 lb

## 2016-01-14 DIAGNOSIS — Z5111 Encounter for antineoplastic chemotherapy: Secondary | ICD-10-CM | POA: Diagnosis not present

## 2016-01-14 DIAGNOSIS — C50511 Malignant neoplasm of lower-outer quadrant of right female breast: Secondary | ICD-10-CM

## 2016-01-14 DIAGNOSIS — C773 Secondary and unspecified malignant neoplasm of axilla and upper limb lymph nodes: Secondary | ICD-10-CM

## 2016-01-14 DIAGNOSIS — Z170 Estrogen receptor positive status [ER+]: Secondary | ICD-10-CM | POA: Diagnosis not present

## 2016-01-14 LAB — COMPREHENSIVE METABOLIC PANEL
ALT: 72 U/L — ABNORMAL HIGH (ref 0–55)
ANION GAP: 8 meq/L (ref 3–11)
AST: 48 U/L — ABNORMAL HIGH (ref 5–34)
Albumin: 3.8 g/dL (ref 3.5–5.0)
Alkaline Phosphatase: 91 U/L (ref 40–150)
BILIRUBIN TOTAL: 0.3 mg/dL (ref 0.20–1.20)
BUN: 13.3 mg/dL (ref 7.0–26.0)
CHLORIDE: 107 meq/L (ref 98–109)
CO2: 28 meq/L (ref 22–29)
Calcium: 9.8 mg/dL (ref 8.4–10.4)
Creatinine: 0.8 mg/dL (ref 0.6–1.1)
EGFR: 84 mL/min/{1.73_m2} — AB (ref >90)
GLUCOSE: 100 mg/dL (ref 70–140)
POTASSIUM: 4.8 meq/L (ref 3.5–5.1)
SODIUM: 143 meq/L (ref 136–145)
Total Protein: 6.5 g/dL (ref 6.4–8.3)

## 2016-01-14 LAB — CBC WITH DIFFERENTIAL/PLATELET
BASO%: 0.7 % (ref 0.0–2.0)
Basophils Absolute: 0 10*3/uL (ref 0.0–0.1)
EOS ABS: 0 10*3/uL (ref 0.0–0.5)
EOS%: 0.6 % (ref 0.0–7.0)
HCT: 35.8 % (ref 34.8–46.6)
HGB: 11.7 g/dL (ref 11.6–15.9)
LYMPH%: 25.9 % (ref 14.0–49.7)
MCH: 32.1 pg (ref 25.1–34.0)
MCHC: 32.8 g/dL (ref 31.5–36.0)
MCV: 98 fL (ref 79.5–101.0)
MONO#: 0.6 10*3/uL (ref 0.1–0.9)
MONO%: 19.1 % — AB (ref 0.0–14.0)
NEUT%: 53.7 % (ref 38.4–76.8)
NEUTROS ABS: 1.7 10*3/uL (ref 1.5–6.5)
PLATELETS: 247 10*3/uL (ref 145–400)
RBC: 3.65 10*6/uL — AB (ref 3.70–5.45)
RDW: 13.8 % (ref 11.2–14.5)
WBC: 3.2 10*3/uL — AB (ref 3.9–10.3)
lymph#: 0.8 10*3/uL — ABNORMAL LOW (ref 0.9–3.3)

## 2016-01-14 MED ORDER — HEPARIN SOD (PORK) LOCK FLUSH 100 UNIT/ML IV SOLN
500.0000 [IU] | Freq: Once | INTRAVENOUS | Status: AC | PRN
  Administered 2016-01-14: 500 [IU]
  Filled 2016-01-14: qty 5

## 2016-01-14 MED ORDER — DIPHENHYDRAMINE HCL 50 MG/ML IJ SOLN
INTRAMUSCULAR | Status: AC
  Filled 2016-01-14: qty 1

## 2016-01-14 MED ORDER — SODIUM CHLORIDE 0.9 % IV SOLN
80.0000 mg/m2 | Freq: Once | INTRAVENOUS | Status: AC
  Administered 2016-01-14: 150 mg via INTRAVENOUS
  Filled 2016-01-14: qty 25

## 2016-01-14 MED ORDER — DIPHENHYDRAMINE HCL 50 MG/ML IJ SOLN
25.0000 mg | Freq: Once | INTRAMUSCULAR | Status: AC
  Administered 2016-01-14: 25 mg via INTRAVENOUS

## 2016-01-14 MED ORDER — DEXAMETHASONE SODIUM PHOSPHATE 100 MG/10ML IJ SOLN
4.0000 mg | Freq: Once | INTRAMUSCULAR | Status: AC
  Administered 2016-01-14: 4 mg via INTRAVENOUS
  Filled 2016-01-14: qty 0.4

## 2016-01-14 MED ORDER — FAMOTIDINE IN NACL 20-0.9 MG/50ML-% IV SOLN
INTRAVENOUS | Status: AC
  Filled 2016-01-14: qty 50

## 2016-01-14 MED ORDER — FAMOTIDINE IN NACL 20-0.9 MG/50ML-% IV SOLN
20.0000 mg | Freq: Once | INTRAVENOUS | Status: AC
  Administered 2016-01-14: 20 mg via INTRAVENOUS

## 2016-01-14 MED ORDER — SODIUM CHLORIDE 0.9% FLUSH
10.0000 mL | INTRAVENOUS | Status: DC | PRN
  Administered 2016-01-14: 10 mL
  Filled 2016-01-14: qty 10

## 2016-01-14 MED ORDER — SODIUM CHLORIDE 0.9 % IV SOLN
Freq: Once | INTRAVENOUS | Status: AC
  Administered 2016-01-14: 12:00:00 via INTRAVENOUS

## 2016-01-14 NOTE — Progress Notes (Signed)
[place]  Telephone:(336) [phone] Fax:(336) [phone]   ID: Michele Alvarez DOB: 12-Apr-1961  MR#: [id number]  KNL#:[id number]  Patient Care Team: Jonathon Jordan, MD as PCP - General (Family Medicine) Avon Gully, NP as Nurse Practitioner (Obstetrics and Gynecology) [name], MD as Consulting Physician (General Surgery) Chauncey Cruel, MD as Consulting Physician (Oncology) Eppie Gibson, MD as Attending Physician (Radiation Oncology) Sylvan Cheese, NP as Nurse Practitioner (Hematology and Oncology) Jovita Gamma, MD as Consulting Physician (Neurosurgery) PCP: Lilian Coma, MD OTHER MD:  CHIEF COMPLAINT: Node-positive breast cancer  CURRENT TREATMENT: Neoadjuvant chemotherapy  BREAST CANCER HISTORY: From the original intake note:  "Michele Alvarez" had routine screening mammography at [facility] 08/07/2015. There was an area of calcification in the right breast 2:00 position. There was also an area of calcification in the left breast 7:00 position. She was recalled for bilateral diagnostic mammography 08/12/2015. The left breast calcifications were felt to be likely benign and repeat mammography in 6 months was suggested.  On the right, breast density was category D. There was a 1.2 cm irregular mass at the 6:00, inframammary position and also suspicious calcifications. Adding those together the area measured 2.5 cm. Ultrasound of the right breast on the same day found a 1.3 cm lobulated mass in the right breast at the 6:00 position. There was a 1.7 cm axillary lymph node with a fatty hilum.   On 08/30/2015 the patient underwent biopsy of the breast mass, the abnormal lymph node, and the area of calcifications. The calcifications ([id number]) were associated with ductal carcinoma in situ, with the prognostic panel pending. 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 The lymph node also was positive.  Note that the clip to the patient's breast mass did not deploy.  The patient's subsequent history is as detailed below  INTERVAL HISTORY: Michele Alvarez returns for follow up of her estrogen receptor positive breast cancer. Today she will receive the 8th of 12 planned doses of paclitaxel, repeated weekly.   REVIEW OF SYSTEMS: We held her treatment last week because of an intercurrent infection. She completed the Zithromax. She no longer has any upper respiratory problems and no longer feels ill. She does have an auto pattern of cough. She doesn't cough during the day or when she goes to bed but some nights she wakes up with a tickle in her throat and it keeps her coughing for about 10 minutes. This is not every night. She does sleep with and I suspect that is going to be related to that. She has some pain at the base of her nails, but no change at all in sensation in the finger pads or toetips. We clarified that today. Aside from these issues a detailed review of systems was negative  PAST MEDICAL HISTORY: Past Medical History  Diagnosis Date  . Breast cancer of lower-outer quadrant of right female breast ([place]) 08/19/2015  . Hot flashes   . PONV (postoperative nausea and vomiting)     PAST SURGICAL HISTORY: Past Surgical History  Procedure Laterality Date  . Neck surgery  2001  . Back surgery      x2, lumbar  . Portacath placement Right 09/09/2015    Procedure: INSERTION PORT-A-CATH WITH [place] ;  Surgeon: [name], MD;  Location: [facility];  Service: General;  Laterality: Right;    FAMILY HISTORY No family history on file. The patient's  father died in 2016 at the age of 74. He had significant COPD. The patient's mother died at the age of 64 from Parkinson's disease. The patient has 1 brother, 1 sister. There is no cancer history in the family to her knowledge.  GYNECOLOGIC HISTORY:  No LMP recorded.  Patient is postmenopausal. Menarche age 19. She has GI asked P0. She stopped having periods in January 2014. She never took hormone replacement or oral contraceptives.  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 She lives with her partner, [name], and 2 cats.     ADVANCED DIRECTIVES: Not in place. At the 09/01/2015 clinic visit the patient was given the appropriate forms to complete and notarize at her discretion.   HEALTH MAINTENANCE: Social History  Substance Use Topics  . Smoking status: Never Smoker   . Smokeless tobacco: Not on file  . Alcohol Use: Yes     Comment: social     Colonoscopy: Never   PAP:  Bone density: Never  Lipid panel:  Allergies  Allergen Reactions  . Latex Other (See Comments)    Fever blisters  . Acyclovir And Related   . Skin Adhesives Dermatitis    Reaction to dermabond, do not use    Current Outpatient Prescriptions  Medication Sig Dispense Refill  . azithromycin (ZITHROMAX Z-PAK) 250 MG tablet Take as directed 6 each 0  . lidocaine-prilocaine (EMLA) cream Apply to affected area once 30 g 3  . magnesium hydroxide (MILK OF MAGNESIA) 400 MG/5ML suspension Take 15 mLs by mouth daily as needed for mild constipation. Reported on 12/10/2015    . omeprazole (PRILOSEC) 40 MG capsule Take 1 capsule (40 mg total) by mouth daily. 60 capsule 3  . prochlorperazine (COMPAZINE) 10 MG tablet Reported on 12/10/2015     No current facility-administered medications for this visit.    OBJECTIVE: Middle-aged white woman  In no acute distress  Filed Vitals:   01/14/16 1103  BP: 111/76  Pulse: 84  Temp: 98.5 F (36.9 C)  Resp: 18     Body mass index is 25.31 kg/(m^2).    ECOG FS:0 - Asymptomatic  Sclerae unicteric, pupils round and equal Oropharynx clear and moist-- no thrush or other lesions No cervical or supraclavicular adenopathy Lungs no rales or rhonchi Heart regular rate and rhythm Abd soft, nontender, positive bowel sounds MSK no  focal spinal tenderness, no upper extremity lymphedema Neuro: nonfocal, well oriented, appropriate affect Breasts: Deferred  LAB RESULTS:  CMP     Component Value Date/Time   NA 143 01/07/2016 1033   K 4.2 01/07/2016 1033   CO2 30* 01/07/2016 1033   GLUCOSE 95 01/07/2016 1033   BUN 12.7 01/07/2016 1033   CREATININE 0.8 01/07/2016 1033   CALCIUM 9.8 01/07/2016 1033   PROT 6.5 01/07/2016 1033   ALBUMIN 3.7 01/07/2016 1033   AST 41* 01/07/2016 1033   ALT 75* 01/07/2016 1033   ALKPHOS 90 01/07/2016 1033   BILITOT 0.36 01/07/2016 1033    INo results found for: SPEP, UPEP  Lab Results  Component Value Date   WBC 3.2* 01/14/2016   NEUTROABS 1.7 01/14/2016   HGB 11.7 01/14/2016   HCT 35.8 01/14/2016   MCV 98.0 01/14/2016   PLT 247 01/14/2016      Chemistry      Component Value Date/Time   NA 143 01/07/2016 1033   K 4.2 01/07/2016 1033   CO2 30* 01/07/2016 1033   BUN 12.7 01/07/2016 1033   CREATININE 0.8 01/07/2016  1033      Component Value Date/Time   CALCIUM 9.8 01/07/2016 1033   ALKPHOS 90 01/07/2016 1033   AST 41* 01/07/2016 1033   ALT 75* 01/07/2016 1033   BILITOT 0.36 01/07/2016 1033       No results found for: LABCA2  No components found for: LABCA125  No results for input(s): INR in the last 168 hours.  Urinalysis No results found for: COLORURINE, APPEARANCEUR, LABSPEC, PHURINE, GLUCOSEU, HGBUR, BILIRUBINUR, KETONESUR, PROTEINUR, UROBILINOGEN, NITRITE, LEUKOCYTESUR  ELIGIBLE FOR AVAILABLE RESEARCH PROTOCOL: Alliance, PREVENT  STUDIES: No results found.   ASSESSMENT: 55 y.o. [place] woman status post right breast lower outer quadrant and axillary lymph node biopsy 08/30/2015, both positive for a clinical T1-2 N1, stage II invasive ductal carcinoma, grade 3, estrogen and progesterone receptor positive, HER-2/neu nonamplified, with an MIB-1 of 80%  (1) neoadjuvant chemotherapy started 09/16/2015 consisting of doxorubicin and cyclophosphamide in  dose dense fashion 4, completed 10/28/2015, followed by paclitaxel weekly 12  started 11/19/2015  (2) definitive surgery to follow with consideration of the Alliance trial  (3) adjuvant radiation to follow surgery  (4) adjuvant antiestrogens to begin at the completion of local therapy  PLAN: Michele Alvarez has cleared up her upper respiratory infection and is ready to resume treatment. She will receive Taxol today and the next 4 weeks.  Her counts are holding up well. The total white cell count is a little bit on the low side. I find that in many patients this remains the pattern for many years. The important thing is that the neutrophils are in the normal range.  Today we finally cleared the fact that the pain she is having is at the base of her nails and is not neuropathy. Neuropathy of course is in the pads of the fingers. All this has been very confusing but now that we got a clarified she is very clear that she has absolutely no numbness or tingling on the pads of her fingers or the tips of her toes. She does have irritation of the fingernail beds. This is due to inflammation they are secondary to the chemotherapy, and will completely resolve once the chemotherapy stops  She knows to call for any problems that may develop before the next visit here Chauncey Cruel, MD   01/14/2016 11:16 AM

## 2016-01-14 NOTE — Patient Instructions (Signed)
Cancer Center Discharge Instructions for Patients Receiving Chemotherapy  Today you received the following chemotherapy agents Taxol  To help prevent nausea and vomiting after your treatment, we encourage you to take your nausea medication as needed   If you develop nausea and vomiting that is not controlled by your nausea medication, call the clinic.   BELOW ARE SYMPTOMS THAT SHOULD BE REPORTED IMMEDIATELY:  *FEVER GREATER THAN 100.5 F  *CHILLS WITH OR WITHOUT FEVER  NAUSEA AND VOMITING THAT IS NOT CONTROLLED WITH YOUR NAUSEA MEDICATION  *UNUSUAL SHORTNESS OF BREATH  *UNUSUAL BRUISING OR BLEEDING  TENDERNESS IN MOUTH AND THROAT WITH OR WITHOUT PRESENCE OF ULCERS  *URINARY PROBLEMS  *BOWEL PROBLEMS  UNUSUAL RASH Items with * indicate a potential emergency and should be followed up as soon as possible.  Feel free to call the clinic you have any questions or concerns. The clinic phone number is [phone].  Please show the CHEMO ALERT CARD at check-in to the Emergency Department and triage nurse.   

## 2016-01-21 ENCOUNTER — Ambulatory Visit (HOSPITAL_BASED_OUTPATIENT_CLINIC_OR_DEPARTMENT_OTHER): Payer: 59

## 2016-01-21 ENCOUNTER — Ambulatory Visit (HOSPITAL_BASED_OUTPATIENT_CLINIC_OR_DEPARTMENT_OTHER): Payer: 59 | Admitting: Oncology

## 2016-01-21 ENCOUNTER — Other Ambulatory Visit (HOSPITAL_BASED_OUTPATIENT_CLINIC_OR_DEPARTMENT_OTHER): Payer: 59

## 2016-01-21 ENCOUNTER — Telehealth: Payer: Self-pay | Admitting: *Deleted

## 2016-01-21 VITALS — BP 103/76 | HR 81 | Temp 97.9°F | Resp 18 | Ht 68.0 in | Wt 166.9 lb

## 2016-01-21 DIAGNOSIS — C773 Secondary and unspecified malignant neoplasm of axilla and upper limb lymph nodes: Secondary | ICD-10-CM

## 2016-01-21 DIAGNOSIS — C50511 Malignant neoplasm of lower-outer quadrant of right female breast: Secondary | ICD-10-CM

## 2016-01-21 DIAGNOSIS — Z5111 Encounter for antineoplastic chemotherapy: Secondary | ICD-10-CM

## 2016-01-21 LAB — COMPREHENSIVE METABOLIC PANEL
ALBUMIN: 3.9 g/dL (ref 3.5–5.0)
ALK PHOS: 84 U/L (ref 40–150)
ALT: 65 U/L — ABNORMAL HIGH (ref 0–55)
AST: 42 U/L — AB (ref 5–34)
Anion Gap: 9 mEq/L (ref 3–11)
BILIRUBIN TOTAL: 0.5 mg/dL (ref 0.20–1.20)
BUN: 12.9 mg/dL (ref 7.0–26.0)
CO2: 26 mEq/L (ref 22–29)
Calcium: 9.9 mg/dL (ref 8.4–10.4)
Chloride: 108 mEq/L (ref 98–109)
Creatinine: 0.8 mg/dL (ref 0.6–1.1)
EGFR: 85 mL/min/{1.73_m2} — ABNORMAL LOW (ref >90)
GLUCOSE: 116 mg/dL (ref 70–140)
Potassium: 4.3 mEq/L (ref 3.5–5.1)
SODIUM: 143 meq/L (ref 136–145)
TOTAL PROTEIN: 6.4 g/dL (ref 6.4–8.3)

## 2016-01-21 LAB — CBC WITH DIFFERENTIAL/PLATELET
BASO%: 1.1 % (ref 0.0–2.0)
BASOS ABS: 0 10*3/uL (ref 0.0–0.1)
EOS ABS: 0 10*3/uL (ref 0.0–0.5)
EOS%: 1.5 % (ref 0.0–7.0)
HCT: 36.2 % (ref 34.8–46.6)
HEMOGLOBIN: 12.1 g/dL (ref 11.6–15.9)
LYMPH%: 36.9 % (ref 14.0–49.7)
MCH: 32.3 pg (ref 25.1–34.0)
MCHC: 33.4 g/dL (ref 31.5–36.0)
MCV: 96.5 fL (ref 79.5–101.0)
MONO#: 0.2 10*3/uL (ref 0.1–0.9)
MONO%: 8.6 % (ref 0.0–14.0)
NEUT%: 51.9 % (ref 38.4–76.8)
NEUTROS ABS: 1.4 10*3/uL — AB (ref 1.5–6.5)
PLATELETS: 215 10*3/uL (ref 145–400)
RBC: 3.75 10*6/uL (ref 3.70–5.45)
RDW: 13.1 % (ref 11.2–14.5)
WBC: 2.7 10*3/uL — AB (ref 3.9–10.3)
lymph#: 1 10*3/uL (ref 0.9–3.3)

## 2016-01-21 MED ORDER — SODIUM CHLORIDE 0.9 % IV SOLN
4.0000 mg | Freq: Once | INTRAVENOUS | Status: AC
  Administered 2016-01-21: 4 mg via INTRAVENOUS
  Filled 2016-01-21: qty 0.4

## 2016-01-21 MED ORDER — FAMOTIDINE IN NACL 20-0.9 MG/50ML-% IV SOLN
INTRAVENOUS | Status: AC
  Filled 2016-01-21: qty 50

## 2016-01-21 MED ORDER — SODIUM CHLORIDE 0.9 % IV SOLN
80.0000 mg/m2 | Freq: Once | INTRAVENOUS | Status: AC
  Administered 2016-01-21: 150 mg via INTRAVENOUS
  Filled 2016-01-21: qty 25

## 2016-01-21 MED ORDER — HEPARIN SOD (PORK) LOCK FLUSH 100 UNIT/ML IV SOLN
500.0000 [IU] | Freq: Once | INTRAVENOUS | Status: AC | PRN
  Administered 2016-01-21: 500 [IU]
  Filled 2016-01-21: qty 5

## 2016-01-21 MED ORDER — DIPHENHYDRAMINE HCL 50 MG/ML IJ SOLN
INTRAMUSCULAR | Status: AC
  Filled 2016-01-21: qty 1

## 2016-01-21 MED ORDER — SODIUM CHLORIDE 0.9% FLUSH
10.0000 mL | INTRAVENOUS | Status: DC | PRN
  Administered 2016-01-21: 10 mL
  Filled 2016-01-21: qty 10

## 2016-01-21 MED ORDER — FAMOTIDINE IN NACL 20-0.9 MG/50ML-% IV SOLN
20.0000 mg | Freq: Once | INTRAVENOUS | Status: AC
  Administered 2016-01-21: 20 mg via INTRAVENOUS

## 2016-01-21 MED ORDER — DIPHENHYDRAMINE HCL 50 MG/ML IJ SOLN
25.0000 mg | Freq: Once | INTRAMUSCULAR | Status: AC
  Administered 2016-01-21: 25 mg via INTRAVENOUS

## 2016-01-21 MED ORDER — SODIUM CHLORIDE 0.9 % IV SOLN
Freq: Once | INTRAVENOUS | Status: AC
  Administered 2016-01-21: 12:00:00 via INTRAVENOUS

## 2016-01-21 NOTE — Patient Instructions (Signed)
[facility] Discharge Instructions for Patients Receiving Chemotherapy  Today you received the following chemotherapy agents Taxol   To help prevent nausea and vomiting after your treatment, we encourage you to take your nausea medication as directed.   If you develop nausea and vomiting that is not controlled by your nausea medication, call the clinic.   BELOW ARE SYMPTOMS THAT SHOULD BE REPORTED IMMEDIATELY:  *FEVER GREATER THAN 100.5 F  *CHILLS WITH OR WITHOUT FEVER  NAUSEA AND VOMITING THAT IS NOT CONTROLLED WITH YOUR NAUSEA MEDICATION  *UNUSUAL SHORTNESS OF BREATH  *UNUSUAL BRUISING OR BLEEDING  TENDERNESS IN MOUTH AND THROAT WITH OR WITHOUT PRESENCE OF ULCERS  *URINARY PROBLEMS  *BOWEL PROBLEMS  UNUSUAL RASH Items with * indicate a potential emergency and should be followed up as soon as possible.  Feel free to call the clinic you have any questions or concerns. The clinic phone number is [phone].  Please show the CHEMO ALERT CARD at check-in to the Emergency Department and triage nurse.   

## 2016-01-21 NOTE — Telephone Encounter (Signed)
This RN called to charge nurse in treatment room - MD ok to treat pt per [place] of 1.4.

## 2016-01-21 NOTE — Progress Notes (Signed)
[place]  [phone] [phone]   ID: Michele Alvarez DOB: 1961-03-28  MR#: [id number]  MEQ#:[id number]  Patient Care Team: Jonathon Jordan, MD as PCP - General (Family Medicine) Avon Gully, NP as Nurse Practitioner (Obstetrics and Gynecology) [name], MD as Consulting Physician (General Surgery) Chauncey Cruel, MD as Consulting Physician (Oncology) Eppie Gibson, MD as Attending Physician (Radiation Oncology) Sylvan Cheese, NP as Nurse Practitioner (Hematology and Oncology) Jovita Gamma, MD as Consulting Physician (Neurosurgery) PCP: Lilian Coma, MD OTHER MD:  CHIEF COMPLAINT: Node-positive breast cancer  CURRENT TREATMENT: Neoadjuvant chemotherapy  BREAST CANCER HISTORY: From the original intake note:  "Michele Alvarez" had routine screening mammography at [facility] 08/07/2015. There was an area of calcification in the right breast 2:00 position. There was also an area of calcification in the left breast 7:00 position. She was recalled for bilateral diagnostic mammography 08/12/2015. The left breast calcifications were felt to be likely benign and repeat mammography in 6 months was suggested.  On the right, breast density was category D. There was a 1.2 cm irregular mass at the 6:00, inframammary position and also suspicious calcifications. Adding those together the area measured 2.5 cm. Ultrasound of the right breast on the same day found a 1.3 cm lobulated mass in the right breast at the 6:00 position. There was a 1.7 cm axillary lymph node with a fatty hilum.   On 08/30/2015 the patient underwent biopsy of the breast mass, the abnormal lymph node, and the area of calcifications. The calcifications ([id number]) were associated with ductal carcinoma in situ, with the prognostic panel pending. 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 The lymph node also was positive.  Note that the clip to the patient's breast mass did not deploy.  The patient's subsequent history is as detailed below  INTERVAL HISTORY: Michele Alvarez returns for follow up of her breast cancer accompanied by her partner [name]. Today is day 1 cycle 9 of 12 planned doses of paclitaxel, repeated weekly.   REVIEW OF SYSTEMS: She has absolutely no peripheral neuropathy symptoms. The discomfort she was experiencing at the base of the nails this almost completely gone. She has had no other problems. She is walking perhaps 10 minutes twice a day in the evening. She does 10 minutes, takes a break, and then does tend more minutes. She did visit her sister at the beach and walk more while there. Otherwise a detailed review of systems today was noncontributory  PAST MEDICAL HISTORY: Past Medical History  Diagnosis Date  . Breast cancer of lower-outer quadrant of right female breast ([place]) 08/19/2015  . Hot flashes   . PONV (postoperative nausea and vomiting)     PAST SURGICAL HISTORY: Past Surgical History  Procedure Laterality Date  . Neck surgery  09/25/1999  . Back surgery      x2, lumbar  . Portacath placement Right 09/09/2015    Procedure: INSERTION PORT-A-CATH WITH [place] ;  Surgeon: [name], MD;  Location: [place];  Service: General;  Laterality: Right;    FAMILY HISTORY No family history on file. The patient's father died in 09-25-14 at the age of 34. He had significant COPD. The patient's mother died at the age of 71 from Parkinson's disease. The patient has 1 brother, 1 sister. There is no cancer history in the family to her knowledge.  GYNECOLOGIC HISTORY:  No LMP recorded.  Patient is postmenopausal. Menarche age 56. She has GI asked P0. She stopped having periods in January 2014. She never took hormone replacement or oral contraceptives.  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 She lives with  her partner, [name], and 2 cats.     ADVANCED DIRECTIVES: Not in place. At the 09/01/2015 clinic visit the patient was given the appropriate forms to complete and notarize at her discretion.   HEALTH MAINTENANCE: Social History  Substance Use Topics  . Smoking status: Never Smoker   . Smokeless tobacco: Not on file  . Alcohol Use: Yes     Comment: social     Colonoscopy: Never   PAP:  Bone density: Never  Lipid panel:  Allergies  Allergen Reactions  . Latex Other (See Comments)    Fever blisters  . Acyclovir And Related   . Skin Adhesives Dermatitis    Reaction to dermabond, do not use    Current Outpatient Prescriptions  Medication Sig Dispense Refill  . lidocaine-prilocaine (EMLA) cream Apply to affected area once 30 g 3  . prochlorperazine (COMPAZINE) 10 MG tablet Reported on 12/10/2015     No current facility-administered medications for this visit.    OBJECTIVE: Middle-aged white womanWho appears well  Filed Vitals:   01/21/16 1107  BP: 103/76  Pulse: 81  Temp: 97.9 F (36.6 C)  Resp: 18     Body mass index is 25.38 kg/(m^2).    ECOG FS:0 - Asymptomatic  Sclerae unicteric, EOMs intact Oropharynx clear and moist No cervical or supraclavicular adenopathy Lungs no rales or rhonchi Heart regular rate and rhythm Abd soft, nontender, positive bowel sounds MSK no focal spinal tenderness, no upper extremity lymphedema Neuro: nonfocal, well oriented, appropriate affect Breasts: Deferred  LAB RESULTS:  CMP     Component Value Date/Time   NA 143 01/21/2016 1049   K 4.3 01/21/2016 1049   CO2 26 01/21/2016 1049   GLUCOSE 116 01/21/2016 1049   BUN 12.9 01/21/2016 1049   CREATININE 0.8 01/21/2016 1049   CALCIUM 9.9 01/21/2016 1049   PROT 6.4 01/21/2016 1049   ALBUMIN 3.9 01/21/2016 1049   AST 42* 01/21/2016 1049   ALT 65* 01/21/2016 1049   ALKPHOS 84 01/21/2016 1049   BILITOT 0.50 01/21/2016 1049    INo results found for: SPEP, UPEP  Lab  Results  Component Value Date   WBC 2.7* 01/21/2016   NEUTROABS 1.4* 01/21/2016   HGB 12.1 01/21/2016   HCT 36.2 01/21/2016   MCV 96.5 01/21/2016   PLT 215 01/21/2016      Chemistry      Component Value Date/Time   NA 143 01/21/2016 1049   K 4.3 01/21/2016 1049   CO2 26 01/21/2016 1049   BUN 12.9 01/21/2016 1049   CREATININE 0.8 01/21/2016 1049      Component Value Date/Time   CALCIUM 9.9 01/21/2016 1049   ALKPHOS 84 01/21/2016 1049   AST 42* 01/21/2016 1049   ALT 65* 01/21/2016 1049   BILITOT 0.50 01/21/2016 1049       No results found for: LABCA2  No components found for: LABCA125  No results for input(s): INR in the last 168 hours.  Urinalysis No results found for: COLORURINE, APPEARANCEUR, LABSPEC, PHURINE, GLUCOSEU, HGBUR, BILIRUBINUR, KETONESUR, PROTEINUR, UROBILINOGEN, NITRITE, LEUKOCYTESUR  ELIGIBLE FOR AVAILABLE RESEARCH PROTOCOL: Alliance, PREVENT  STUDIES: No results found.   ASSESSMENT: 55 y.o.  woman status post right breast lower outer quadrant and axillary lymph node biopsy 08/30/2015, both  positive for a clinical T1-2 N1, stage II invasive ductal carcinoma, grade 3, estrogen and progesterone receptor positive, HER-2/neu nonamplified, with an MIB-1 of 80%  (1) neoadjuvant chemotherapy started 09/16/2015 consisting of doxorubicin and cyclophosphamide in dose dense fashion 4, completed 10/28/2015, followed by paclitaxel weekly 12  started 11/19/2015  (2) definitive surgery to follow with consideration of the Alliance trial  (3) adjuvant radiation to follow surgery  (4) adjuvant antiestrogens to begin at the completion of local therapy  PLAN: Michele Alvarez Is showing no signs of peripheral neuropathy, and is ready to proceed to her ninth of 12 planned doses of paclitaxel.  Her counts are dropping a little bit. It would not surprise me if we had to postpone her treatment next week. We will try to get through the whole 12 cycles however with a  minimum of interruption I encouraged her to continue and extend her exercise program. She knows to call for any problems that may develop before her next visit. Chauncey Cruel, MD   01/21/2016 12:04 PM

## 2016-01-28 ENCOUNTER — Ambulatory Visit (HOSPITAL_BASED_OUTPATIENT_CLINIC_OR_DEPARTMENT_OTHER): Payer: 59

## 2016-01-28 ENCOUNTER — Other Ambulatory Visit (HOSPITAL_BASED_OUTPATIENT_CLINIC_OR_DEPARTMENT_OTHER): Payer: 59

## 2016-01-28 ENCOUNTER — Encounter: Payer: Self-pay | Admitting: *Deleted

## 2016-01-28 ENCOUNTER — Ambulatory Visit (HOSPITAL_BASED_OUTPATIENT_CLINIC_OR_DEPARTMENT_OTHER): Payer: 59 | Admitting: Oncology

## 2016-01-28 VITALS — BP 111/72 | HR 98 | Temp 98.4°F | Resp 18 | Ht 68.0 in | Wt 168.5 lb

## 2016-01-28 DIAGNOSIS — C50511 Malignant neoplasm of lower-outer quadrant of right female breast: Secondary | ICD-10-CM

## 2016-01-28 DIAGNOSIS — Z170 Estrogen receptor positive status [ER+]: Secondary | ICD-10-CM | POA: Diagnosis not present

## 2016-01-28 DIAGNOSIS — C773 Secondary and unspecified malignant neoplasm of axilla and upper limb lymph nodes: Secondary | ICD-10-CM | POA: Diagnosis not present

## 2016-01-28 DIAGNOSIS — Z5111 Encounter for antineoplastic chemotherapy: Secondary | ICD-10-CM

## 2016-01-28 LAB — CBC WITH DIFFERENTIAL/PLATELET
BASO%: 0.9 % (ref 0.0–2.0)
Basophils Absolute: 0 10*3/uL (ref 0.0–0.1)
EOS ABS: 0.1 10*3/uL (ref 0.0–0.5)
EOS%: 2.2 % (ref 0.0–7.0)
HCT: 37 % (ref 34.8–46.6)
HEMOGLOBIN: 12.1 g/dL (ref 11.6–15.9)
LYMPH%: 35.8 % (ref 14.0–49.7)
MCH: 31.7 pg (ref 25.1–34.0)
MCHC: 32.7 g/dL (ref 31.5–36.0)
MCV: 96.8 fL (ref 79.5–101.0)
MONO#: 0.2 10*3/uL (ref 0.1–0.9)
MONO%: 7.9 % (ref 0.0–14.0)
NEUT%: 53.2 % (ref 38.4–76.8)
NEUTROS ABS: 1.4 10*3/uL — AB (ref 1.5–6.5)
PLATELETS: 199 10*3/uL (ref 145–400)
RBC: 3.82 10*6/uL (ref 3.70–5.45)
RDW: 14 % (ref 11.2–14.5)
WBC: 2.6 10*3/uL — AB (ref 3.9–10.3)
lymph#: 0.9 10*3/uL (ref 0.9–3.3)

## 2016-01-28 LAB — COMPREHENSIVE METABOLIC PANEL
ALBUMIN: 3.9 g/dL (ref 3.5–5.0)
ALK PHOS: 83 U/L (ref 40–150)
ALT: 55 U/L (ref 0–55)
AST: 35 U/L — AB (ref 5–34)
Anion Gap: 9 mEq/L (ref 3–11)
BUN: 15.8 mg/dL (ref 7.0–26.0)
CO2: 26 mEq/L (ref 22–29)
Calcium: 9.7 mg/dL (ref 8.4–10.4)
Chloride: 108 mEq/L (ref 98–109)
Creatinine: 0.8 mg/dL (ref 0.6–1.1)
EGFR: 83 mL/min/{1.73_m2} — ABNORMAL LOW (ref >90)
GLUCOSE: 96 mg/dL (ref 70–140)
POTASSIUM: 4.4 meq/L (ref 3.5–5.1)
SODIUM: 143 meq/L (ref 136–145)
Total Bilirubin: 0.42 mg/dL (ref 0.20–1.20)
Total Protein: 6.5 g/dL (ref 6.4–8.3)

## 2016-01-28 MED ORDER — HEPARIN SOD (PORK) LOCK FLUSH 100 UNIT/ML IV SOLN
500.0000 [IU] | Freq: Once | INTRAVENOUS | Status: AC | PRN
  Administered 2016-01-28: 500 [IU]
  Filled 2016-01-28: qty 5

## 2016-01-28 MED ORDER — DIPHENHYDRAMINE HCL 50 MG/ML IJ SOLN
25.0000 mg | Freq: Once | INTRAMUSCULAR | Status: AC
  Administered 2016-01-28: 25 mg via INTRAVENOUS

## 2016-01-28 MED ORDER — SODIUM CHLORIDE 0.9% FLUSH
10.0000 mL | INTRAVENOUS | Status: DC | PRN
  Administered 2016-01-28: 10 mL
  Filled 2016-01-28: qty 10

## 2016-01-28 MED ORDER — FAMOTIDINE IN NACL 20-0.9 MG/50ML-% IV SOLN
INTRAVENOUS | Status: AC
  Filled 2016-01-28: qty 50

## 2016-01-28 MED ORDER — SODIUM CHLORIDE 0.9 % IV SOLN
4.0000 mg | Freq: Once | INTRAVENOUS | Status: AC
  Administered 2016-01-28: 4 mg via INTRAVENOUS
  Filled 2016-01-28: qty 0.4

## 2016-01-28 MED ORDER — SODIUM CHLORIDE 0.9 % IV SOLN
80.0000 mg/m2 | Freq: Once | INTRAVENOUS | Status: AC
  Administered 2016-01-28: 150 mg via INTRAVENOUS
  Filled 2016-01-28: qty 25

## 2016-01-28 MED ORDER — FAMOTIDINE IN NACL 20-0.9 MG/50ML-% IV SOLN
20.0000 mg | Freq: Once | INTRAVENOUS | Status: AC
  Administered 2016-01-28: 20 mg via INTRAVENOUS

## 2016-01-28 MED ORDER — SODIUM CHLORIDE 0.9 % IV SOLN
Freq: Once | INTRAVENOUS | Status: AC
  Administered 2016-01-28: 11:00:00 via INTRAVENOUS

## 2016-01-28 MED ORDER — DIPHENHYDRAMINE HCL 50 MG/ML IJ SOLN
INTRAMUSCULAR | Status: AC
  Filled 2016-01-28: qty 1

## 2016-01-28 NOTE — Progress Notes (Signed)
Ok to treat today with ANC of 1.4 VO  Dr.Magrinat/Jan [employer]

## 2016-01-28 NOTE — Progress Notes (Signed)
[place]  Telephone:(336) [phone] Fax:(336) [phone]   ID: Michele Alvarez DOB: 1961/07/07  MR#: [id number]  SFS#:[id number]  Patient Care Team: Jonathon Jordan, MD as PCP - General (Family Medicine) Avon Gully, NP as Nurse Practitioner (Obstetrics and Gynecology) [name], MD as Consulting Physician (General Surgery) Chauncey Cruel, MD as Consulting Physician (Oncology) Eppie Gibson, MD as Attending Physician (Radiation Oncology) Sylvan Cheese, NP as Nurse Practitioner (Hematology and Oncology) Jovita Gamma, MD as Consulting Physician (Neurosurgery) PCP: Lilian Coma, MD OTHER MD:  CHIEF COMPLAINT: Node-positive breast cancer  CURRENT TREATMENT: Neoadjuvant chemotherapy  BREAST CANCER HISTORY: From the original intake note:  "Michele Alvarez" had routine screening mammography at [facility] 08/07/2015. There was an area of calcification in the right breast 2:00 position. There was also an area of calcification in the left breast 7:00 position. She was recalled for bilateral diagnostic mammography 08/12/2015. The left breast calcifications were felt to be likely benign and repeat mammography in 6 months was suggested.  On the right, breast density was category D. There was a 1.2 cm irregular mass at the 6:00, inframammary position and also suspicious calcifications. Adding those together the area measured 2.5 cm. Ultrasound of the right breast on the same day found a 1.3 cm lobulated mass in the right breast at the 6:00 position. There was a 1.7 cm axillary lymph node with a fatty hilum.   On 08/30/2015 the patient underwent biopsy of the breast mass, the abnormal lymph node, and the area of calcifications. The calcifications ([id number]) were associated with ductal carcinoma in situ, with the prognostic panel pending. 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 The lymph node also was positive.  Note that the clip to the patient's breast mass did not deploy.  The patient's subsequent history is as detailed below  INTERVAL HISTORY: Michele Alvarez returns for follow up of her breast cancer accompanied by her partner [name]. Today is day 1 cycle 10 of 12 planned doses of paclitaxel, repeated weekly.   REVIEW OF SYSTEMS: Michele Alvarez continues to tolerate her treatment well. She went boating last weekend. She has not had any peripheral neuropathy symptoms. She does have changes in her nails and some pain at the base of the nails on the dorsal aspect of the fingers. This is unchanged. She has had a little bit more fatigue but also she has been working longer hours this past week Detailed review of systems today was otherwise noncontributory  PAST MEDICAL HISTORY: Past Medical History  Diagnosis Date  . Breast cancer of lower-outer quadrant of right female breast ([place]) 08/19/2015  . Hot flashes   . PONV (postoperative nausea and vomiting)     PAST SURGICAL HISTORY: Past Surgical History  Procedure Laterality Date  . Neck surgery  Sep 23, 1999  . Back surgery      x2, lumbar  . Portacath placement Right 09/09/2015    Procedure: INSERTION PORT-A-CATH WITH [place] ;  Surgeon: [name], MD;  Location: [place];  Service: General;  Laterality: Right;    FAMILY HISTORY No family history on file. The patient's father died in Sep 23, 2014 at the age of 44. He had significant COPD. The patient's mother died at the age of 63 from Parkinson's disease. The patient has 1 brother, 1 sister. There is no cancer history in the family to her knowledge.  GYNECOLOGIC HISTORY:  No LMP recorded. Patient is  postmenopausal. Menarche age 55. She has GI asked P0. She stopped having periods in January 2014. She never took hormone replacement or oral contraceptives.  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 She lives with her partner,  [name], and 2 cats.     ADVANCED DIRECTIVES: Not in place. At the 09/01/2015 clinic visit the patient was given the appropriate forms to complete and notarize at her discretion.   HEALTH MAINTENANCE: Social History  Substance Use Topics  . Smoking status: Never Smoker   . Smokeless tobacco: Not on file  . Alcohol Use: Yes     Comment: social     Colonoscopy: Never   PAP:  Bone density: Never  Lipid panel:  Allergies  Allergen Reactions  . Latex Other (See Comments)    Fever blisters  . Acyclovir And Related   . Skin Adhesives Dermatitis    Reaction to dermabond, do not use    Current Outpatient Prescriptions  Medication Sig Dispense Refill  . lidocaine-prilocaine (EMLA) cream Apply to affected area once 30 g 3  . prochlorperazine (COMPAZINE) 10 MG tablet Reported on 12/10/2015     No current facility-administered medications for this visit.    OBJECTIVE: Middle-aged white woman in no acute distress  Filed Vitals:   01/28/16 1021  BP: 111/72  Pulse: 98  Temp: 98.4 F (36.9 C)  Resp: 18     Body mass index is 25.63 kg/(m^2).    ECOG FS:1 - Symptomatic but completely ambulatory  Sclerae unicteric, pupils round and equal Oropharynx clear and moist-- no thrush or other lesions No cervical or supraclavicular adenopathy Lungs no rales or rhonchi Heart regular rate and rhythm Abd soft, nontender, positive bowel sounds MSK no focal spinal tenderness, no upper extremity lymphedema Neuro: nonfocal, well oriented, appropriate affect Breasts: Deferred    LAB RESULTS:  CMP     Component Value Date/Time   NA 143 01/21/2016 1049   K 4.3 01/21/2016 1049   CO2 26 01/21/2016 1049   GLUCOSE 116 01/21/2016 1049   BUN 12.9 01/21/2016 1049   CREATININE 0.8 01/21/2016 1049   CALCIUM 9.9 01/21/2016 1049   PROT 6.4 01/21/2016 1049   ALBUMIN 3.9 01/21/2016 1049   AST 42* 01/21/2016 1049   ALT 65* 01/21/2016 1049   ALKPHOS 84 01/21/2016 1049   BILITOT 0.50  01/21/2016 1049    INo results found for: SPEP, UPEP  Lab Results  Component Value Date   WBC 2.6* 01/28/2016   NEUTROABS 1.4* 01/28/2016   HGB 12.1 01/28/2016   HCT 37.0 01/28/2016   MCV 96.8 01/28/2016   PLT 199 01/28/2016      Chemistry      Component Value Date/Time   NA 143 01/21/2016 1049   K 4.3 01/21/2016 1049   CO2 26 01/21/2016 1049   BUN 12.9 01/21/2016 1049   CREATININE 0.8 01/21/2016 1049      Component Value Date/Time   CALCIUM 9.9 01/21/2016 1049   ALKPHOS 84 01/21/2016 1049   AST 42* 01/21/2016 1049   ALT 65* 01/21/2016 1049   BILITOT 0.50 01/21/2016 1049       No results found for: LABCA2  No components found for: LABCA125  No results for input(s): INR in the last 168 hours.  Urinalysis No results found for: COLORURINE, APPEARANCEUR, LABSPEC, PHURINE, GLUCOSEU, HGBUR, BILIRUBINUR, KETONESUR, PROTEINUR, UROBILINOGEN, NITRITE, LEUKOCYTESUR  ELIGIBLE FOR AVAILABLE RESEARCH PROTOCOL: Alliance, ASA   STUDIES: No results found.   ASSESSMENT: 55 y.o. [place] woman status  post right breast lower outer quadrant and axillary lymph node biopsy 08/30/2015, both positive for a clinical T1-2 N1, stage II invasive ductal carcinoma, grade 3, estrogen and progesterone receptor positive, HER-2/neu nonamplified, with an MIB-1 of 80%  (1) neoadjuvant chemotherapy started 09/16/2015 consisting of doxorubicin and cyclophosphamide in dose dense fashion 4, completed 10/28/2015, followed by paclitaxel weekly 12  started 11/19/2015  (2) definitive surgery to follow with consideration of the Alliance trial  (3) adjuvant radiation to follow surgery  (4) adjuvant antiestrogens to begin at the completion of local therapy  PLAN: Michele Alvarez continues to tolerate her paclitaxel well and we are proceeding to the 10th of 12 planned doses today.   If all goes well she will have her last treatment on 02/11/2016. I have scheduled her for an MRI of the breast the following  Friday, 02/18/2016. She will be seeing surgery shortly after and she is interested in the Alliance trial  She will see me again next week. She knows to call for any problems that may develop before that visit.      Chauncey Cruel, MD   01/28/2016 10:42 AM

## 2016-01-31 ENCOUNTER — Other Ambulatory Visit: Payer: Self-pay | Admitting: *Deleted

## 2016-01-31 DIAGNOSIS — C50511 Malignant neoplasm of lower-outer quadrant of right female breast: Secondary | ICD-10-CM

## 2016-02-04 ENCOUNTER — Other Ambulatory Visit: Payer: Self-pay | Admitting: Medical Oncology

## 2016-02-04 ENCOUNTER — Ambulatory Visit (HOSPITAL_BASED_OUTPATIENT_CLINIC_OR_DEPARTMENT_OTHER): Payer: 59

## 2016-02-04 ENCOUNTER — Ambulatory Visit (HOSPITAL_BASED_OUTPATIENT_CLINIC_OR_DEPARTMENT_OTHER): Payer: 59 | Admitting: Oncology

## 2016-02-04 ENCOUNTER — Other Ambulatory Visit (HOSPITAL_BASED_OUTPATIENT_CLINIC_OR_DEPARTMENT_OTHER): Payer: 59

## 2016-02-04 ENCOUNTER — Telehealth: Payer: Self-pay | Admitting: *Deleted

## 2016-02-04 ENCOUNTER — Telehealth: Payer: Self-pay | Admitting: Oncology

## 2016-02-04 VITALS — BP 105/71 | HR 89 | Temp 98.3°F | Resp 18 | Ht 68.0 in | Wt 168.4 lb

## 2016-02-04 DIAGNOSIS — Z5111 Encounter for antineoplastic chemotherapy: Secondary | ICD-10-CM

## 2016-02-04 DIAGNOSIS — C773 Secondary and unspecified malignant neoplasm of axilla and upper limb lymph nodes: Secondary | ICD-10-CM | POA: Diagnosis not present

## 2016-02-04 DIAGNOSIS — C50511 Malignant neoplasm of lower-outer quadrant of right female breast: Secondary | ICD-10-CM

## 2016-02-04 LAB — CBC WITH DIFFERENTIAL/PLATELET
BASO%: 1.2 % (ref 0.0–2.0)
Basophils Absolute: 0 10*3/uL (ref 0.0–0.1)
EOS%: 2 % (ref 0.0–7.0)
Eosinophils Absolute: 0 10*3/uL (ref 0.0–0.5)
HCT: 35.3 % (ref 34.8–46.6)
HGB: 11.8 g/dL (ref 11.6–15.9)
LYMPH%: 37.7 % (ref 14.0–49.7)
MCH: 32.1 pg (ref 25.1–34.0)
MCHC: 33.4 g/dL (ref 31.5–36.0)
MCV: 96.2 fL (ref 79.5–101.0)
MONO#: 0.2 10*3/uL (ref 0.1–0.9)
MONO%: 7.8 % (ref 0.0–14.0)
NEUT%: 51.3 % (ref 38.4–76.8)
NEUTROS ABS: 1 10*3/uL — AB (ref 1.5–6.5)
PLATELETS: 194 10*3/uL (ref 145–400)
RBC: 3.67 10*6/uL — AB (ref 3.70–5.45)
RDW: 13.8 % (ref 11.2–14.5)
WBC: 1.9 10*3/uL — AB (ref 3.9–10.3)
lymph#: 0.7 10*3/uL — ABNORMAL LOW (ref 0.9–3.3)

## 2016-02-04 LAB — COMPREHENSIVE METABOLIC PANEL
ALT: 43 U/L (ref 0–55)
ANION GAP: 8 meq/L (ref 3–11)
AST: 30 U/L (ref 5–34)
Albumin: 3.8 g/dL (ref 3.5–5.0)
Alkaline Phosphatase: 75 U/L (ref 40–150)
BILIRUBIN TOTAL: 0.55 mg/dL (ref 0.20–1.20)
BUN: 14.2 mg/dL (ref 7.0–26.0)
CHLORIDE: 108 meq/L (ref 98–109)
CO2: 27 meq/L (ref 22–29)
CREATININE: 0.8 mg/dL (ref 0.6–1.1)
Calcium: 9.5 mg/dL (ref 8.4–10.4)
EGFR: 85 mL/min/{1.73_m2} — ABNORMAL LOW (ref >90)
GLUCOSE: 82 mg/dL (ref 70–140)
Potassium: 4.3 mEq/L (ref 3.5–5.1)
SODIUM: 143 meq/L (ref 136–145)
TOTAL PROTEIN: 6.3 g/dL — AB (ref 6.4–8.3)

## 2016-02-04 MED ORDER — HEPARIN SOD (PORK) LOCK FLUSH 100 UNIT/ML IV SOLN
500.0000 [IU] | Freq: Once | INTRAVENOUS | Status: AC | PRN
  Administered 2016-02-04: 500 [IU]
  Filled 2016-02-04: qty 5

## 2016-02-04 MED ORDER — FAMOTIDINE IN NACL 20-0.9 MG/50ML-% IV SOLN
INTRAVENOUS | Status: AC
  Filled 2016-02-04: qty 50

## 2016-02-04 MED ORDER — SODIUM CHLORIDE 0.9 % IV SOLN
Freq: Once | INTRAVENOUS | Status: AC
  Administered 2016-02-04: 11:00:00 via INTRAVENOUS

## 2016-02-04 MED ORDER — DIPHENHYDRAMINE HCL 50 MG/ML IJ SOLN
25.0000 mg | Freq: Once | INTRAMUSCULAR | Status: AC
  Administered 2016-02-04: 25 mg via INTRAVENOUS

## 2016-02-04 MED ORDER — DIPHENHYDRAMINE HCL 50 MG/ML IJ SOLN
INTRAMUSCULAR | Status: AC
  Filled 2016-02-04: qty 1

## 2016-02-04 MED ORDER — SODIUM CHLORIDE 0.9 % IV SOLN
80.0000 mg/m2 | Freq: Once | INTRAVENOUS | Status: AC
  Administered 2016-02-04: 150 mg via INTRAVENOUS
  Filled 2016-02-04: qty 25

## 2016-02-04 MED ORDER — SODIUM CHLORIDE 0.9% FLUSH
10.0000 mL | INTRAVENOUS | Status: DC | PRN
  Administered 2016-02-04: 10 mL
  Filled 2016-02-04: qty 10

## 2016-02-04 MED ORDER — DEXAMETHASONE SODIUM PHOSPHATE 100 MG/10ML IJ SOLN
4.0000 mg | Freq: Once | INTRAMUSCULAR | Status: AC
  Administered 2016-02-04: 4 mg via INTRAVENOUS
  Filled 2016-02-04: qty 0.4

## 2016-02-04 MED ORDER — FAMOTIDINE IN NACL 20-0.9 MG/50ML-% IV SOLN
20.0000 mg | Freq: Once | INTRAVENOUS | Status: AC
  Administered 2016-02-04: 20 mg via INTRAVENOUS

## 2016-02-04 NOTE — Progress Notes (Signed)
OK to treat with ANC of 1.0 per Tivis Ringer, RN per Dr. Jana Hakim

## 2016-02-04 NOTE — Telephone Encounter (Signed)
APPOINTMENTS COMPLETE PER 7/21 LOS. PATIENT WILL GET AVS REPORT/APPOINTMENTS WHILE IN INFUSION AREA.

## 2016-02-04 NOTE — Patient Instructions (Signed)
[facility] Discharge Instructions for Patients Receiving Chemotherapy  Today you received the following chemotherapy agents: Taxol.  To help prevent nausea and vomiting after your treatment, we encourage you to take your nausea medication : Compazine 10 mg every 6 hours as needed.   If you develop nausea and vomiting that is not controlled by your nausea medication, call the clinic.   BELOW ARE SYMPTOMS THAT SHOULD BE REPORTED IMMEDIATELY:  *FEVER GREATER THAN 100.5 F  *CHILLS WITH OR WITHOUT FEVER  NAUSEA AND VOMITING THAT IS NOT CONTROLLED WITH YOUR NAUSEA MEDICATION  *UNUSUAL SHORTNESS OF BREATH  *UNUSUAL BRUISING OR BLEEDING  TENDERNESS IN MOUTH AND THROAT WITH OR WITHOUT PRESENCE OF ULCERS  *URINARY PROBLEMS  *BOWEL PROBLEMS  UNUSUAL RASH Items with * indicate a potential emergency and should be followed up as soon as possible.  Feel free to call the clinic you have any questions or concerns. The clinic phone number is [phone].  Please show the CHEMO ALERT CARD at check-in to the Emergency Department and triage nurse.   

## 2016-02-04 NOTE — Progress Notes (Signed)
[facility]  [phone] Fax:(336) [phone]   ID: Kaydyn Chism Pipe DOB: 09/11/1960  MR#: [id number]  GYI#:[id number]  Patient Care Team: Jonathon Jordan, MD as PCP - General (Family Medicine) Avon Gully, NP as Nurse Practitioner (Obstetrics and Gynecology) [name], MD as Consulting Physician (General Surgery) Chauncey Cruel, MD as Consulting Physician (Oncology) Eppie Gibson, MD as Attending Physician (Radiation Oncology) Sylvan Cheese, NP as Nurse Practitioner (Hematology and Oncology) Jovita Gamma, MD as Consulting Physician (Neurosurgery) Benson Norway, RN as Registered Nurse PCP: Lilian Coma, MD OTHER MD:  CHIEF COMPLAINT: Node-positive breast cancer  CURRENT TREATMENT: Neoadjuvant chemotherapy  BREAST CANCER HISTORY: From the original intake note:  "[name]" had routine screening mammography at [facility] 08/07/2015. There was an area of calcification in the right breast 2:00 position. There was also an area of calcification in the left breast 7:00 position. She was recalled for bilateral diagnostic mammography 08/12/2015. The left breast calcifications were felt to be likely benign and repeat mammography in 6 months was suggested.  On the right, breast density was category D. There was a 1.2 cm irregular mass at the 6:00, inframammary position and also suspicious calcifications. Adding those together the area measured 2.5 cm. Ultrasound of the right breast on the same day found a 1.3 cm lobulated mass in the right breast at the 6:00 position. There was a 1.7 cm axillary lymph node with a fatty hilum.   On 08/30/2015 the patient underwent biopsy of the breast mass, the abnormal lymph node, and the area of calcifications. The calcifications ([id number]) were associated with ductal carcinoma in situ, with the prognostic panel pending. The breast mass proved to be an invasive ductal carcinoma, grade 3, estrogen and progesterone  receptor positive, HER-2/neu nonamplified, with an MIB-1 of 80%. The lymph node also was positive.  Note that the clip to the patient's breast mass did not deploy.  The patient's subsequent history is as detailed below  INTERVAL HISTORY: Eustaquio Maize returns for follow up of her breast cancer accompanied by her partner [name]. Today is day 1 cycle 11 of 12 planned doses of paclitaxel, repeated weekly.   REVIEW OF SYSTEMS: Eustaquio Maize is having some nail changes and some pain at the base of her nails, with no evidence of peripheral neuropathy. Her appetite is good and her sense of taste is fine. Her energy is "decent" and she's been doing some walking and swimming in addition to working from 7:30 in the morning to 5:30 in the evening, since this is a very busy time at her job. She has a little bit of a runny nose and other sinus symptoms, but aside from that a detailed review of systems today was noncontributory  PAST MEDICAL HISTORY: Past Medical History  Diagnosis Date  . Breast cancer of lower-outer quadrant of right female breast ([place]) 08/19/2015  . Hot flashes   . PONV (postoperative nausea and vomiting)     PAST SURGICAL HISTORY: Past Surgical History  Procedure Laterality Date  . Neck surgery  09/08/1999  . Back surgery      x2, lumbar  . Portacath placement Right 09/09/2015    Procedure: INSERTION PORT-A-CATH WITH [place] ;  Surgeon: [name], MD;  Location: [facility];  Service: General;  Laterality: Right;    FAMILY HISTORY No family history on file. The patient's father died in 09/07/14 at the age of 52. He had significant COPD. The patient's mother died at the age of 26 from Parkinson's disease.  The patient has 1 brother, 1 sister. There is no cancer history in the family to her knowledge.  GYNECOLOGIC HISTORY:  No LMP recorded. Patient is postmenopausal. Menarche age 51. She has GI asked P0. She stopped having periods in January 2014. She never took hormone replacement or oral  contraceptives.  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 She lives with her partner, [name], and 2 cats.     ADVANCED DIRECTIVES: Not in place. At the 09/01/2015 clinic visit the patient was given the appropriate forms to complete and notarize at her discretion.   HEALTH MAINTENANCE: Social History  Substance Use Topics  . Smoking status: Never Smoker   . Smokeless tobacco: Not on file  . Alcohol Use: Yes     Comment: social     Colonoscopy: Never   PAP:  Bone density: Never  Lipid panel:  Allergies  Allergen Reactions  . Latex Other (See Comments)    Fever blisters  . Acyclovir And Related   . Skin Adhesives Dermatitis    Reaction to dermabond, do not use    Current Outpatient Prescriptions  Medication Sig Dispense Refill  . lidocaine-prilocaine (EMLA) cream Apply to affected area once 30 g 3  . prochlorperazine (COMPAZINE) 10 MG tablet Reported on 12/10/2015     No current facility-administered medications for this visit.    OBJECTIVE: Middle-aged white woman Who appears well  Filed Vitals:   02/04/16 1001  BP: 105/71  Pulse: 89  Temp: 98.3 F (36.8 C)  Resp: 18     Body mass index is 25.61 kg/(m^2).    ECOG FS:0 - Asymptomatic  Sclerae unicteric, EOMs intact Oropharynx clear and moist No cervical or supraclavicular adenopathy Lungs no rales or rhonchi Heart regular rate and rhythm Abd soft, nontender, positive bowel sounds MSK no focal spinal tenderness, no upper extremity lymphedema Neuro: nonfocal, well oriented, appropriate affect Breasts: Deferred   LAB RESULTS:  CMP     Component Value Date/Time   NA 143 01/28/2016 1009   K 4.4 01/28/2016 1009   CO2 26 01/28/2016 1009   GLUCOSE 96 01/28/2016 1009   BUN 15.8 01/28/2016 1009   CREATININE 0.8 01/28/2016 1009   CALCIUM 9.7 01/28/2016 1009   PROT 6.5 01/28/2016 1009   ALBUMIN 3.9 01/28/2016 1009   AST 35* 01/28/2016 1009   ALT 55 01/28/2016 1009   ALKPHOS 83 01/28/2016  1009   BILITOT 0.42 01/28/2016 1009    INo results found for: SPEP, UPEP  Lab Results  Component Value Date   WBC 1.9* 02/04/2016   NEUTROABS 1.0* 02/04/2016   HGB 11.8 02/04/2016   HCT 35.3 02/04/2016   MCV 96.2 02/04/2016   PLT 194 02/04/2016      Chemistry      Component Value Date/Time   NA 143 01/28/2016 1009   K 4.4 01/28/2016 1009   CO2 26 01/28/2016 1009   BUN 15.8 01/28/2016 1009   CREATININE 0.8 01/28/2016 1009      Component Value Date/Time   CALCIUM 9.7 01/28/2016 1009   ALKPHOS 83 01/28/2016 1009   AST 35* 01/28/2016 1009   ALT 55 01/28/2016 1009   BILITOT 0.42 01/28/2016 1009       No results found for: LABCA2  No components found for: [id number]  No results for input(s): INR in the last 168 hours.  Urinalysis No results found for: COLORURINE, APPEARANCEUR, LABSPEC, PHURINE, GLUCOSEU, HGBUR, BILIRUBINUR, KETONESUR, PROTEINUR, UROBILINOGEN, NITRITE, LEUKOCYTESUR  ELIGIBLE FOR AVAILABLE RESEARCH PROTOCOL: Alliance,  ASA   STUDIES: No results found.   ASSESSMENT: 55 y.o. [place] woman status post right breast lower outer quadrant and axillary lymph node biopsy 08/30/2015, both positive for a clinical T1-2 N1, stage II invasive ductal carcinoma, grade 3, estrogen and progesterone receptor positive, HER-2/neu nonamplified, with an MIB-1 of 80%  (1) neoadjuvant chemotherapy started 09/16/2015 consisting of doxorubicin and cyclophosphamide in dose dense fashion 4, completed 10/28/2015, followed by paclitaxel weekly 12  started 11/19/2015  (2) definitive surgery to follow with consideration of the Alliance trial  (3) adjuvant radiation to follow surgery  (4) adjuvant antiestrogens to begin at the completion of local therapy  PLAN: [name] Continues to have some discomfort at the base of the fingernails, but no dullness on the pads of the fingers or toes and basically no neuropathy. Accordingly we are proceeding with cycle 11 of 12 planned of  paclitaxel today.  Her neutrophil count is 1.0. I don't think we are going to be able to treat her next week with a final dose unless we give her a little bit of a boost. She is agreeable to coming in Monday Tuesday and Wednesday of this coming week just for a Neupogen shot. Hopefully this will only minimally interrupt her work today.  She will then see me next Friday for her final treatment. She is already scheduled for an MRI of the breast 02/18/2016 and to see her surgeon 02/23/2016.  She knows to call for any problems that may develop before her next visit here.     Chauncey Cruel, MD   02/04/2016 10:15 AM

## 2016-02-04 NOTE — Telephone Encounter (Signed)
Ok to treat with noted abnormal labs per MD called to treatment room nurse.

## 2016-02-07 ENCOUNTER — Ambulatory Visit (HOSPITAL_BASED_OUTPATIENT_CLINIC_OR_DEPARTMENT_OTHER): Payer: 59

## 2016-02-07 VITALS — BP 132/72 | HR 89 | Temp 98.6°F | Resp 18

## 2016-02-07 DIAGNOSIS — C50511 Malignant neoplasm of lower-outer quadrant of right female breast: Secondary | ICD-10-CM | POA: Diagnosis not present

## 2016-02-07 DIAGNOSIS — Z5189 Encounter for other specified aftercare: Secondary | ICD-10-CM

## 2016-02-07 MED ORDER — TBO-FILGRASTIM 300 MCG/0.5ML ~~LOC~~ SOSY
300.0000 ug | PREFILLED_SYRINGE | Freq: Once | SUBCUTANEOUS | Status: AC
  Administered 2016-02-07: 300 ug via SUBCUTANEOUS
  Filled 2016-02-07: qty 0.5

## 2016-02-08 ENCOUNTER — Ambulatory Visit (HOSPITAL_BASED_OUTPATIENT_CLINIC_OR_DEPARTMENT_OTHER): Payer: 59

## 2016-02-08 VITALS — BP 116/72 | HR 99 | Temp 98.3°F | Resp 20

## 2016-02-08 DIAGNOSIS — Z5189 Encounter for other specified aftercare: Secondary | ICD-10-CM | POA: Diagnosis not present

## 2016-02-08 DIAGNOSIS — C50511 Malignant neoplasm of lower-outer quadrant of right female breast: Secondary | ICD-10-CM

## 2016-02-08 MED ORDER — TBO-FILGRASTIM 300 MCG/0.5ML ~~LOC~~ SOSY
300.0000 ug | PREFILLED_SYRINGE | Freq: Once | SUBCUTANEOUS | Status: AC
  Administered 2016-02-08: 300 ug via SUBCUTANEOUS
  Filled 2016-02-08: qty 0.5

## 2016-02-08 NOTE — Patient Instructions (Signed)

## 2016-02-09 ENCOUNTER — Ambulatory Visit (HOSPITAL_BASED_OUTPATIENT_CLINIC_OR_DEPARTMENT_OTHER): Payer: 59

## 2016-02-09 VITALS — BP 121/62 | HR 99 | Temp 97.9°F | Resp 18

## 2016-02-09 DIAGNOSIS — Z5189 Encounter for other specified aftercare: Secondary | ICD-10-CM

## 2016-02-09 DIAGNOSIS — C50511 Malignant neoplasm of lower-outer quadrant of right female breast: Secondary | ICD-10-CM

## 2016-02-09 MED ORDER — TBO-FILGRASTIM 300 MCG/0.5ML ~~LOC~~ SOSY
300.0000 ug | PREFILLED_SYRINGE | Freq: Once | SUBCUTANEOUS | Status: AC
  Administered 2016-02-09: 300 ug via SUBCUTANEOUS
  Filled 2016-02-09: qty 0.5

## 2016-02-09 NOTE — Patient Instructions (Signed)

## 2016-02-11 ENCOUNTER — Telehealth: Payer: Self-pay | Admitting: Oncology

## 2016-02-11 ENCOUNTER — Other Ambulatory Visit (HOSPITAL_BASED_OUTPATIENT_CLINIC_OR_DEPARTMENT_OTHER): Payer: 59

## 2016-02-11 ENCOUNTER — Ambulatory Visit (HOSPITAL_BASED_OUTPATIENT_CLINIC_OR_DEPARTMENT_OTHER): Payer: 59

## 2016-02-11 ENCOUNTER — Encounter: Payer: Self-pay | Admitting: Medical Oncology

## 2016-02-11 ENCOUNTER — Encounter: Payer: Self-pay | Admitting: *Deleted

## 2016-02-11 ENCOUNTER — Ambulatory Visit (HOSPITAL_BASED_OUTPATIENT_CLINIC_OR_DEPARTMENT_OTHER): Payer: 59 | Admitting: Oncology

## 2016-02-11 VITALS — BP 120/76 | HR 98 | Temp 98.7°F | Resp 18 | Ht 68.0 in | Wt 171.1 lb

## 2016-02-11 DIAGNOSIS — Z5111 Encounter for antineoplastic chemotherapy: Secondary | ICD-10-CM | POA: Diagnosis not present

## 2016-02-11 DIAGNOSIS — C773 Secondary and unspecified malignant neoplasm of axilla and upper limb lymph nodes: Secondary | ICD-10-CM | POA: Diagnosis not present

## 2016-02-11 DIAGNOSIS — C50511 Malignant neoplasm of lower-outer quadrant of right female breast: Secondary | ICD-10-CM | POA: Diagnosis not present

## 2016-02-11 DIAGNOSIS — G629 Polyneuropathy, unspecified: Secondary | ICD-10-CM

## 2016-02-11 LAB — COMPREHENSIVE METABOLIC PANEL
ALT: 40 U/L (ref 0–55)
AST: 27 U/L (ref 5–34)
Albumin: 3.7 g/dL (ref 3.5–5.0)
Alkaline Phosphatase: 97 U/L (ref 40–150)
Anion Gap: 8 mEq/L (ref 3–11)
BUN: 12.8 mg/dL (ref 7.0–26.0)
CO2: 27 meq/L (ref 22–29)
Calcium: 9.4 mg/dL (ref 8.4–10.4)
Chloride: 109 mEq/L (ref 98–109)
Creatinine: 0.8 mg/dL (ref 0.6–1.1)
EGFR: 80 mL/min/{1.73_m2} — AB (ref >90)
GLUCOSE: 117 mg/dL (ref 70–140)
Potassium: 4.3 mEq/L (ref 3.5–5.1)
SODIUM: 145 meq/L (ref 136–145)
TOTAL PROTEIN: 6.1 g/dL — AB (ref 6.4–8.3)

## 2016-02-11 LAB — CBC WITH DIFFERENTIAL/PLATELET
BASO%: 0.9 % (ref 0.0–2.0)
Basophils Absolute: 0.1 10*3/uL (ref 0.0–0.1)
EOS ABS: 0.1 10*3/uL (ref 0.0–0.5)
EOS%: 0.5 % (ref 0.0–7.0)
HCT: 34.3 % — ABNORMAL LOW (ref 34.8–46.6)
HGB: 11.3 g/dL — ABNORMAL LOW (ref 11.6–15.9)
LYMPH%: 10.7 % — AB (ref 14.0–49.7)
MCH: 32 pg (ref 25.1–34.0)
MCHC: 32.9 g/dL (ref 31.5–36.0)
MCV: 97.2 fL (ref 79.5–101.0)
MONO#: 1.1 10*3/uL — ABNORMAL HIGH (ref 0.1–0.9)
MONO%: 8.4 % (ref 0.0–14.0)
NEUT%: 79.5 % — ABNORMAL HIGH (ref 38.4–76.8)
NEUTROS ABS: 10.2 10*3/uL — AB (ref 1.5–6.5)
Platelets: 213 10*3/uL (ref 145–400)
RBC: 3.53 10*6/uL — AB (ref 3.70–5.45)
RDW: 14.1 % (ref 11.2–14.5)
WBC: 12.9 10*3/uL — AB (ref 3.9–10.3)
lymph#: 1.4 10*3/uL (ref 0.9–3.3)

## 2016-02-11 LAB — TECHNOLOGIST REVIEW

## 2016-02-11 MED ORDER — FAMOTIDINE IN NACL 20-0.9 MG/50ML-% IV SOLN
INTRAVENOUS | Status: AC
  Filled 2016-02-11: qty 50

## 2016-02-11 MED ORDER — DIPHENHYDRAMINE HCL 50 MG/ML IJ SOLN
INTRAMUSCULAR | Status: AC
  Filled 2016-02-11: qty 1

## 2016-02-11 MED ORDER — FAMOTIDINE IN NACL 20-0.9 MG/50ML-% IV SOLN
20.0000 mg | Freq: Once | INTRAVENOUS | Status: AC
  Administered 2016-02-11: 20 mg via INTRAVENOUS

## 2016-02-11 MED ORDER — HEPARIN SOD (PORK) LOCK FLUSH 100 UNIT/ML IV SOLN
500.0000 [IU] | Freq: Once | INTRAVENOUS | Status: AC | PRN
  Administered 2016-02-11: 500 [IU]
  Filled 2016-02-11: qty 5

## 2016-02-11 MED ORDER — SODIUM CHLORIDE 0.9 % IV SOLN
80.0000 mg/m2 | Freq: Once | INTRAVENOUS | Status: AC
  Administered 2016-02-11: 150 mg via INTRAVENOUS
  Filled 2016-02-11: qty 25

## 2016-02-11 MED ORDER — DIPHENHYDRAMINE HCL 50 MG/ML IJ SOLN
25.0000 mg | Freq: Once | INTRAMUSCULAR | Status: AC
  Administered 2016-02-11: 25 mg via INTRAVENOUS

## 2016-02-11 MED ORDER — SODIUM CHLORIDE 0.9 % IV SOLN
4.0000 mg | Freq: Once | INTRAVENOUS | Status: AC
  Administered 2016-02-11: 4 mg via INTRAVENOUS
  Filled 2016-02-11: qty 0.4

## 2016-02-11 MED ORDER — SODIUM CHLORIDE 0.9 % IV SOLN
Freq: Once | INTRAVENOUS | Status: AC
  Administered 2016-02-11: 12:00:00 via INTRAVENOUS

## 2016-02-11 MED ORDER — SODIUM CHLORIDE 0.9% FLUSH
10.0000 mL | INTRAVENOUS | Status: DC | PRN
  Administered 2016-02-11: 10 mL
  Filled 2016-02-11: qty 10

## 2016-02-11 NOTE — Patient Instructions (Signed)
[facility] Discharge Instructions for Patients Receiving Chemotherapy  Today you received the following chemotherapy agents: Taxol.  To help prevent nausea and vomiting after your treatment, we encourage you to take your nausea medication : Compazine 10 mg every 6 hours as needed.   If you develop nausea and vomiting that is not controlled by your nausea medication, call the clinic.   BELOW ARE SYMPTOMS THAT SHOULD BE REPORTED IMMEDIATELY:  *FEVER GREATER THAN 100.5 F  *CHILLS WITH OR WITHOUT FEVER  NAUSEA AND VOMITING THAT IS NOT CONTROLLED WITH YOUR NAUSEA MEDICATION  *UNUSUAL SHORTNESS OF BREATH  *UNUSUAL BRUISING OR BLEEDING  TENDERNESS IN MOUTH AND THROAT WITH OR WITHOUT PRESENCE OF ULCERS  *URINARY PROBLEMS  *BOWEL PROBLEMS  UNUSUAL RASH Items with * indicate a potential emergency and should be followed up as soon as possible.  Feel free to call the clinic you have any questions or concerns. The clinic phone number is [phone].  Please show the CHEMO ALERT CARD at check-in to the Emergency Department and triage nurse.   

## 2016-02-11 NOTE — Progress Notes (Signed)
[name]  [phone] Fax:(336) [phone]   ID: Michele Alvarez DOB: April 15, 1961  MR#: [id number]  RCV#:[id number]  Patient Care Team: Jonathon Jordan, MD as PCP - General (Family Medicine) Avon Gully, NP as Nurse Practitioner (Obstetrics and Gynecology) [name], MD as Consulting Physician (General Surgery) Chauncey Cruel, MD as Consulting Physician (Oncology) Eppie Gibson, MD as Attending Physician (Radiation Oncology) Sylvan Cheese, NP as Nurse Practitioner (Hematology and Oncology) Jovita Gamma, MD as Consulting Physician (Neurosurgery) Benson Norway, RN as Registered Nurse PCP: Lilian Coma, MD OTHER MD:  CHIEF COMPLAINT: Node-positive breast cancer  CURRENT TREATMENT: Completing neoadjuvant chemotherapy  BREAST CANCER HISTORY: From the original intake note:  "Michele Alvarez" had routine screening mammography at [facility] 08/07/2015. There was an area of calcification in the right breast 2:00 position. There was also an area of calcification in the left breast 7:00 position. She was recalled for bilateral diagnostic mammography 08/12/2015. The left breast calcifications were felt to be likely benign and repeat mammography in 6 months was suggested.  On the right, breast density was category D. There was a 1.2 cm irregular mass at the 6:00, inframammary position and also suspicious calcifications. Adding those together the area measured 2.5 cm. Ultrasound of the right breast on the same day found a 1.3 cm lobulated mass in the right breast at the 6:00 position. There was a 1.7 cm axillary lymph node with a fatty hilum.   On 08/30/2015 the patient underwent biopsy of the breast mass, the abnormal lymph node, and the area of calcifications. The calcifications ([id number]) were associated with ductal carcinoma in situ, with the prognostic panel pending. The breast mass proved to be an invasive ductal carcinoma, grade 3, estrogen and  progesterone receptor positive, HER-2/neu nonamplified, with an MIB-1 of 80%. The lymph node also was positive.  Note that the clip to the patient's breast mass did not deploy.  The patient's subsequent history is as detailed below  INTERVAL HISTORY: Michele Alvarez returns for follow up of her estrogen receptor positive breast cancer accompanied by her partner Michele Alvarez and the patient's brother. Today is day 1 cycle 12 of 12 planned doses of paclitaxel, repeated weekly.   REVIEW OF SYSTEMS: Michele Alvarez has had absolutely no peripheral neuropathy symptoms and still does not. The worst problem she has had with Taxol is feeling bloated. She did have taste alteration with the earlier chemotherapy but not with Taxol. Her nails continue to be "a little weird" but she does not have significant pain at the nailbeds. This does not limit her activities and she continues to work full-time. She is trying to exercise as regularly as possible. She had a minimal epistaxis this morning. A detailed review of systems today was otherwise noncontributory  PAST MEDICAL HISTORY: Past Medical History:  Diagnosis Date  . Breast cancer of lower-outer quadrant of right female breast ([place]) 08/19/2015  . Hot flashes   . PONV (postoperative nausea and vomiting)     PAST SURGICAL HISTORY: Past Surgical History:  Procedure Laterality Date  . BACK SURGERY     x2, lumbar  . NECK SURGERY  09-08-99  . PORTACATH PLACEMENT Right 09/09/2015   Procedure: INSERTION PORT-A-CATH WITH [place] ;  Surgeon: [name], MD;  Location: [facility];  Service: General;  Laterality: Right;    FAMILY HISTORY No family history on file. The patient's father died in Sep 07, 2014 at the age of 27. He had significant COPD. The patient's mother died at the age of  54 from Parkinson's disease. The patient has 1 brother, 1 sister. There is no cancer history in the family to her knowledge.  GYNECOLOGIC HISTORY:  No LMP recorded. Patient is  postmenopausal. Menarche age 35. She has GI asked P0. She stopped having periods in January 2014. She never took hormone replacement or oral contraceptives.  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 She lives with her partner, [name], and 2 cats.     ADVANCED DIRECTIVES: Not in place. At the 09/01/2015 clinic visit the patient was given the appropriate forms to complete and notarize at her discretion.   HEALTH MAINTENANCE: Social History  Substance Use Topics  . Smoking status: Never Smoker  . Smokeless tobacco: Not on file  . Alcohol use Yes     Comment: social     Colonoscopy: Never   PAP:  Bone density: Never  Lipid panel:  Allergies  Allergen Reactions  . Latex Other (See Comments)    Fever blisters  . Acyclovir And Related   . Skin Adhesives Dermatitis    Reaction to dermabond, do not use    Current Outpatient Prescriptions  Medication Sig Dispense Refill  . lidocaine-prilocaine (EMLA) cream Apply to affected area once 30 g 3  . prochlorperazine (COMPAZINE) 10 MG tablet Reported on 12/10/2015     No current facility-administered medications for this visit.     OBJECTIVE: Middle-aged white woman In no acute distress Vitals:   02/11/16 1040  BP: 120/76  Pulse: 98  Resp: 18  Temp: 98.7 F (37.1 C)     Body mass index is 26.02 kg/m.    ECOG FS:0 - Asymptomatic  Sclerae unicteric, pupils round and equal Oropharynx clear and moist-- no thrush or other lesions No cervical or supraclavicular adenopathy Lungs no rales or rhonchi Heart regular rate and rhythm Abd soft, nontender, positive bowel sounds MSK no focal spinal tenderness, no upper extremity lymphedema Neuro: nonfocal, well oriented, appropriate affect Breasts: Deferred   LAB RESULTS:  CMP     Component Value Date/Time   NA 143 02/04/2016 0946   K 4.3 02/04/2016 0946   CO2 27 02/04/2016 0946   GLUCOSE 82 02/04/2016 0946   BUN 14.2 02/04/2016 0946   CREATININE 0.8 02/04/2016  0946   CALCIUM 9.5 02/04/2016 0946   PROT 6.3 (L) 02/04/2016 0946   ALBUMIN 3.8 02/04/2016 0946   AST 30 02/04/2016 0946   ALT 43 02/04/2016 0946   ALKPHOS 75 02/04/2016 0946   BILITOT 0.55 02/04/2016 0946    INo results found for: SPEP, UPEP  Lab Results  Component Value Date   WBC 12.9 (H) 02/11/2016   NEUTROABS 10.2 (H) 02/11/2016   HGB 11.3 (L) 02/11/2016   HCT 34.3 (L) 02/11/2016   MCV 97.2 02/11/2016   PLT 213 02/11/2016      Chemistry      Component Value Date/Time   NA 143 02/04/2016 0946   K 4.3 02/04/2016 0946   CO2 27 02/04/2016 0946   BUN 14.2 02/04/2016 0946   CREATININE 0.8 02/04/2016 0946      Component Value Date/Time   CALCIUM 9.5 02/04/2016 0946   ALKPHOS 75 02/04/2016 0946   AST 30 02/04/2016 0946   ALT 43 02/04/2016 0946   BILITOT 0.55 02/04/2016 0946       No results found for: LABCA2  No components found for: LXBWI203  No results for input(s): INR in the last 168 hours.  Urinalysis No results found for: COLORURINE, APPEARANCEUR, [place], [place],  GLUCOSEU, HGBUR, BILIRUBINUR, KETONESUR, PROTEINUR, UROBILINOGEN, NITRITE, LEUKOCYTESUR  ELIGIBLE FOR AVAILABLE RESEARCH PROTOCOL: Alliance, [phone]  STUDIES: No results found.   ASSESSMENT: 54 y.o. [place] woman status post right breast lower outer quadrant and axillary lymph node biopsy 08/30/2015, both positive for a clinical T1-2 N1, stage II invasive ductal carcinoma, grade 3, estrogen and progesterone receptor positive, HER-2/neu nonamplified, with an MIB-1 of 80%  (1) neoadjuvant chemotherapy started 09/16/2015 consisting of doxorubicin and cyclophosphamide in dose dense fashion 4, completed 10/28/2015, followed by paclitaxel weekly 12  started 11/19/2015, completed 02/11/2016  (2) definitive surgery to follow with consideration of the Alliance trial  (3) adjuvant radiation to follow surgery  (4) adjuvant antiestrogens to begin at the completion of local therapy  (5)  participating in DCP-001 study   PLAN: Today is a big day for Michele Alvarez as she completes her chemotherapy. She has done a terrific job and in particular never developed any peripheral neuropathy symptoms from the paclitaxel.  She responded vigorously to the few doses of Neupogen she received several cast today are excellent.  She is scheduled for repeat MRI early next week and then she will see her surgeon on August 9. She will have her port removed at the time of her definitive surgery.  I have requested an appointment with radiation oncology, Dr. Isidore Moos, for the first week in September and the patient will return to see me in October, by which time she will be ready to start anti-estrogens.  Michele Alvarez knows to  call for any problems that may develop before her next visit here.  Chauncey Cruel, MD   02/11/2016 10:58 AM

## 2016-02-11 NOTE — Telephone Encounter (Signed)
appt made and avs printed °

## 2016-02-11 NOTE — Progress Notes (Signed)
DCP-001 Patient here today for appointment and treatment with Dr. Jana Hakim. I met with patient and patient's friend in exam room for research nurse, Tyrell Antonio, who is out of office today, for re-consent of DCP-001 study. I went over the consent form page by page, Protocol Version dated 12/23/2015, with patient, highlighting all the changes in this new version. After review of changes with patient and all patients questions answered to her satisfaction, patient proceeded to sign, date and time it. Patient was provided with a copy of the consent form Protocol Version 12/23/2015 for her records. Patient denies further questions at this time. Patient was thanked for her time and support of study and encouraged to contact Dr. Jana Hakim or Tyrell Antonio with any questions or concerns she may have.  Adele Dan, RN, BSN Clinical Research 02/11/2016 11:05 AM

## 2016-02-14 ENCOUNTER — Other Ambulatory Visit: Payer: Self-pay | Admitting: *Deleted

## 2016-02-14 MED ORDER — DIAZEPAM 5 MG PO TABS: ORAL_TABLET | ORAL | 0 refills | Status: DC

## 2016-02-18 ENCOUNTER — Other Ambulatory Visit: Payer: 59

## 2016-02-18 ENCOUNTER — Ambulatory Visit
Admission: RE | Admit: 2016-02-18 | Discharge: 2016-02-18 | Disposition: A | Discharge status: Home / Self Care | Payer: 59 | Source: Ambulatory Visit | Attending: Oncology | Admitting: Oncology

## 2016-02-18 DIAGNOSIS — C50511 Malignant neoplasm of lower-outer quadrant of right female breast: Secondary | ICD-10-CM

## 2016-02-18 MED ORDER — GADOBENATE DIMEGLUMINE 529 MG/ML IV SOLN
16.0000 mL | Freq: Once | INTRAVENOUS | Status: AC | PRN
  Administered 2016-02-18: 16 mL via INTRAVENOUS

## 2016-02-23 ENCOUNTER — Encounter: Payer: Self-pay | Admitting: Genetic Counselor

## 2016-02-23 ENCOUNTER — Other Ambulatory Visit: Payer: Self-pay | Admitting: General Surgery

## 2016-02-23 DIAGNOSIS — C50911 Malignant neoplasm of unspecified site of right female breast: Secondary | ICD-10-CM

## 2016-02-24 ENCOUNTER — Other Ambulatory Visit: Payer: Self-pay | Admitting: Oncology

## 2016-03-01 ENCOUNTER — Encounter (HOSPITAL_BASED_OUTPATIENT_CLINIC_OR_DEPARTMENT_OTHER): Payer: Self-pay | Admitting: *Deleted

## 2016-03-06 NOTE — Pre-Procedure Instructions (Signed)
Patient given boost breeze drink and instructed to finish by 9 am DOS. She voiced understanding

## 2016-03-08 ENCOUNTER — Ambulatory Visit (HOSPITAL_BASED_OUTPATIENT_CLINIC_OR_DEPARTMENT_OTHER)
Admission: RE | Admit: 2016-03-08 | Discharge: 2016-03-08 | Disposition: A | Discharge status: Home / Self Care | Payer: 59 | Source: Ambulatory Visit | Attending: General Surgery | Admitting: General Surgery

## 2016-03-08 ENCOUNTER — Ambulatory Visit (HOSPITAL_BASED_OUTPATIENT_CLINIC_OR_DEPARTMENT_OTHER): Payer: 59 | Admitting: Certified Registered"

## 2016-03-08 ENCOUNTER — Encounter (HOSPITAL_BASED_OUTPATIENT_CLINIC_OR_DEPARTMENT_OTHER): Payer: Self-pay | Admitting: Certified Registered"

## 2016-03-08 ENCOUNTER — Ambulatory Visit (HOSPITAL_COMMUNITY)
Admission: RE | Admit: 2016-03-08 | Discharge: 2016-03-08 | Disposition: A | Discharge status: Home / Self Care | Payer: 59 | Source: Ambulatory Visit | Attending: General Surgery | Admitting: General Surgery

## 2016-03-08 ENCOUNTER — Encounter (HOSPITAL_BASED_OUTPATIENT_CLINIC_OR_DEPARTMENT_OTHER)
Admission: RE | Disposition: A | Discharge status: Home / Self Care | Payer: Self-pay | Source: Ambulatory Visit | Attending: General Surgery

## 2016-03-08 DIAGNOSIS — Z9221 Personal history of antineoplastic chemotherapy: Secondary | ICD-10-CM | POA: Diagnosis not present

## 2016-03-08 DIAGNOSIS — C50511 Malignant neoplasm of lower-outer quadrant of right female breast: Secondary | ICD-10-CM | POA: Diagnosis not present

## 2016-03-08 DIAGNOSIS — Z170 Estrogen receptor positive status [ER+]: Secondary | ICD-10-CM | POA: Insufficient documentation

## 2016-03-08 DIAGNOSIS — C50911 Malignant neoplasm of unspecified site of right female breast: Secondary | ICD-10-CM | POA: Diagnosis present

## 2016-03-08 DIAGNOSIS — D0511 Intraductal carcinoma in situ of right breast: Secondary | ICD-10-CM | POA: Diagnosis not present

## 2016-03-08 DIAGNOSIS — Z79899 Other long term (current) drug therapy: Secondary | ICD-10-CM | POA: Diagnosis not present

## 2016-03-08 DIAGNOSIS — Z452 Encounter for adjustment and management of vascular access device: Secondary | ICD-10-CM | POA: Insufficient documentation

## 2016-03-08 HISTORY — PX: PORT-A-CATH REMOVAL: SHX5289

## 2016-03-08 HISTORY — PX: BREAST LUMPECTOMY WITH RADIOACTIVE SEED AND AXILLARY LYMPH NODE DISSECTION: SHX6656

## 2016-03-08 SURGERY — BREAST LUMPECTOMY WITH RADIOACTIVE SEED AND AXILLARY LYMPH NODE DISSECTION
Anesthesia: General | Site: Breast | Laterality: Right

## 2016-03-08 MED ORDER — CEFAZOLIN SODIUM-DEXTROSE 2-4 GM/100ML-% IV SOLN
INTRAVENOUS | Status: AC
  Filled 2016-03-08: qty 100

## 2016-03-08 MED ORDER — CEFAZOLIN SODIUM-DEXTROSE 2-4 GM/100ML-% IV SOLN
2.0000 g | INTRAVENOUS | Status: AC
  Administered 2016-03-08: 2 g via INTRAVENOUS

## 2016-03-08 MED ORDER — GLYCOPYRROLATE 0.2 MG/ML IJ SOLN: 0.2000 mg | Freq: Once | INTRAMUSCULAR | Status: DC | PRN

## 2016-03-08 MED ORDER — ONDANSETRON HCL 4 MG/2ML IJ SOLN
INTRAMUSCULAR | Status: AC
  Filled 2016-03-08: qty 2

## 2016-03-08 MED ORDER — FENTANYL CITRATE (PF) 100 MCG/2ML IJ SOLN
INTRAMUSCULAR | Status: AC
  Filled 2016-03-08: qty 2

## 2016-03-08 MED ORDER — SCOPOLAMINE 1 MG/3DAYS TD PT72
1.0000 | MEDICATED_PATCH | Freq: Once | TRANSDERMAL | Status: AC | PRN
  Administered 2016-03-08: 1 via TRANSDERMAL

## 2016-03-08 MED ORDER — OXYCODONE HCL 5 MG/5ML PO SOLN: 5.0000 mg | Freq: Once | ORAL | Status: DC | PRN

## 2016-03-08 MED ORDER — LIDOCAINE 2% (20 MG/ML) 5 ML SYRINGE
INTRAMUSCULAR | Status: AC
  Filled 2016-03-08: qty 5

## 2016-03-08 MED ORDER — MEPERIDINE HCL 25 MG/ML IJ SOLN: 6.2500 mg | INTRAMUSCULAR | Status: DC | PRN

## 2016-03-08 MED ORDER — CELECOXIB 200 MG PO CAPS
ORAL_CAPSULE | ORAL | Status: AC
  Filled 2016-03-08: qty 2

## 2016-03-08 MED ORDER — GABAPENTIN 300 MG PO CAPS
ORAL_CAPSULE | ORAL | Status: AC
  Filled 2016-03-08: qty 1

## 2016-03-08 MED ORDER — HYDROMORPHONE HCL 1 MG/ML IJ SOLN
0.2500 mg | INTRAMUSCULAR | Status: DC | PRN
  Administered 2016-03-08 (×3): 0.5 mg via INTRAVENOUS

## 2016-03-08 MED ORDER — FENTANYL CITRATE (PF) 100 MCG/2ML IJ SOLN
50.0000 ug | INTRAMUSCULAR | Status: AC | PRN
  Administered 2016-03-08 (×4): 50 ug via INTRAVENOUS

## 2016-03-08 MED ORDER — ONDANSETRON HCL 4 MG/2ML IJ SOLN
INTRAMUSCULAR | Status: DC | PRN
  Administered 2016-03-08: 4 mg via INTRAVENOUS

## 2016-03-08 MED ORDER — LACTATED RINGERS IV SOLN
INTRAVENOUS | Status: DC
  Administered 2016-03-08: 13:00:00 via INTRAVENOUS
  Administered 2016-03-08: 10 mL/h via INTRAVENOUS

## 2016-03-08 MED ORDER — BUPIVACAINE-EPINEPHRINE (PF) 0.5% -1:200000 IJ SOLN
INTRAMUSCULAR | Status: DC | PRN
  Administered 2016-03-08: 30 mL

## 2016-03-08 MED ORDER — HYDROMORPHONE HCL 1 MG/ML IJ SOLN
INTRAMUSCULAR | Status: AC
  Filled 2016-03-08: qty 1

## 2016-03-08 MED ORDER — PROPOFOL 10 MG/ML IV BOLUS
INTRAVENOUS | Status: DC | PRN
  Administered 2016-03-08: 150 mg via INTRAVENOUS

## 2016-03-08 MED ORDER — MIDAZOLAM HCL 2 MG/2ML IJ SOLN
INTRAMUSCULAR | Status: AC
  Filled 2016-03-08: qty 2

## 2016-03-08 MED ORDER — OXYCODONE HCL 5 MG PO TABS: 5.0000 mg | ORAL_TABLET | Freq: Once | ORAL | Status: DC | PRN

## 2016-03-08 MED ORDER — CELECOXIB 200 MG PO CAPS
200.0000 mg | ORAL_CAPSULE | ORAL | Status: AC
  Administered 2016-03-08: 200 mg via ORAL

## 2016-03-08 MED ORDER — PROMETHAZINE HCL 25 MG/ML IJ SOLN: 6.2500 mg | INTRAMUSCULAR | Status: DC | PRN

## 2016-03-08 MED ORDER — DEXAMETHASONE SODIUM PHOSPHATE 4 MG/ML IJ SOLN
INTRAMUSCULAR | Status: DC | PRN
  Administered 2016-03-08: 10 mg via INTRAVENOUS

## 2016-03-08 MED ORDER — MIDAZOLAM HCL 2 MG/2ML IJ SOLN
1.0000 mg | INTRAMUSCULAR | Status: DC | PRN
  Administered 2016-03-08 (×2): 2 mg via INTRAVENOUS

## 2016-03-08 MED ORDER — DEXAMETHASONE SODIUM PHOSPHATE 10 MG/ML IJ SOLN
INTRAMUSCULAR | Status: AC
  Filled 2016-03-08: qty 1

## 2016-03-08 MED ORDER — SCOPOLAMINE 1 MG/3DAYS TD PT72
MEDICATED_PATCH | TRANSDERMAL | Status: AC
  Filled 2016-03-08: qty 1

## 2016-03-08 MED ORDER — BUPIVACAINE HCL (PF) 0.25 % IJ SOLN
INTRAMUSCULAR | Status: DC | PRN
  Administered 2016-03-08: 12 mL

## 2016-03-08 MED ORDER — TECHNETIUM TC 99M SULFUR COLLOID FILTERED
1.0000 | Freq: Once | INTRAVENOUS | Status: AC | PRN
  Administered 2016-03-08: 1 via INTRADERMAL

## 2016-03-08 MED ORDER — SODIUM CHLORIDE 0.9 % IJ SOLN
INTRAVENOUS | Status: DC | PRN
  Administered 2016-03-08: 3 mL

## 2016-03-08 MED ORDER — LIDOCAINE HCL (CARDIAC) 20 MG/ML IV SOLN
INTRAVENOUS | Status: DC | PRN
  Administered 2016-03-08: 30 mg via INTRAVENOUS

## 2016-03-08 MED ORDER — GABAPENTIN 300 MG PO CAPS
300.0000 mg | ORAL_CAPSULE | ORAL | Status: AC
  Administered 2016-03-08: 300 mg via ORAL

## 2016-03-08 MED ORDER — ACETAMINOPHEN 500 MG PO TABS
1000.0000 mg | ORAL_TABLET | ORAL | Status: AC
  Administered 2016-03-08: 1000 mg via ORAL

## 2016-03-08 MED ORDER — ACETAMINOPHEN 500 MG PO TABS
ORAL_TABLET | ORAL | Status: AC
  Filled 2016-03-08: qty 2

## 2016-03-08 MED ORDER — TRAMADOL HCL 50 MG PO TABS: 50.0000 mg | ORAL_TABLET | Freq: Four times a day (QID) | ORAL | 0 refills | Status: DC | PRN

## 2016-03-08 SURGICAL SUPPLY — 60 items
BINDER BREAST LRG (GAUZE/BANDAGES/DRESSINGS) ×4 IMPLANT
BINDER BREAST MEDIUM (GAUZE/BANDAGES/DRESSINGS) IMPLANT
BINDER BREAST XLRG (GAUZE/BANDAGES/DRESSINGS) IMPLANT
BINDER BREAST XXLRG (GAUZE/BANDAGES/DRESSINGS) IMPLANT
BLADE SURG 15 STRL LF DISP TIS (BLADE) ×2 IMPLANT
BLADE SURG 15 STRL SS (BLADE) ×4
CANISTER SUC SOCK COL 7IN (MISCELLANEOUS) IMPLANT
CANISTER SUCT 1200ML W/VALVE (MISCELLANEOUS) IMPLANT
CHLORAPREP W/TINT 26ML (MISCELLANEOUS) ×4 IMPLANT
CLIP TI WIDE RED SMALL 6 (CLIP) ×4 IMPLANT
CLOSURE WOUND 1/2 X4 (GAUZE/BANDAGES/DRESSINGS) ×1
COVER BACK TABLE 60X90IN (DRAPES) ×4 IMPLANT
COVER MAYO STAND STRL (DRAPES) ×4 IMPLANT
COVER PROBE W GEL 5X96 (DRAPES) ×4 IMPLANT
DECANTER SPIKE VIAL GLASS SM (MISCELLANEOUS) IMPLANT
DEVICE DUBIN W/COMP PLATE 8390 (MISCELLANEOUS) ×8 IMPLANT
DRAPE LAPAROSCOPIC ABDOMINAL (DRAPES) ×4 IMPLANT
DRAPE LAPAROTOMY 100X72 PEDS (DRAPES) IMPLANT
DRAPE UTILITY XL STRL (DRAPES) ×4 IMPLANT
DRSG TEGADERM 4X4.75 (GAUZE/BANDAGES/DRESSINGS) ×4 IMPLANT
ELECT COATED BLADE 2.86 ST (ELECTRODE) ×4 IMPLANT
ELECT REM PT RETURN 9FT ADLT (ELECTROSURGICAL) ×4
ELECTRODE REM PT RTRN 9FT ADLT (ELECTROSURGICAL) ×2 IMPLANT
GLOVE BIOGEL PI IND STRL 7.0 (GLOVE) ×4 IMPLANT
GLOVE BIOGEL PI IND STRL 7.5 (GLOVE) ×2 IMPLANT
GLOVE BIOGEL PI INDICATOR 7.0 (GLOVE) ×4
GLOVE BIOGEL PI INDICATOR 7.5 (GLOVE) ×2
GLOVE SURG SS PI 7.0 STRL IVOR (GLOVE) ×16 IMPLANT
GOWN STRL REUS W/ TWL LRG LVL3 (GOWN DISPOSABLE) ×6 IMPLANT
GOWN STRL REUS W/TWL LRG LVL3 (GOWN DISPOSABLE) ×12
ILLUMINATOR WAVEGUIDE N/F (MISCELLANEOUS) ×4 IMPLANT
KIT MARKER MARGIN INK (KITS) ×4 IMPLANT
LIGHT WAVEGUIDE WIDE FLAT (MISCELLANEOUS) IMPLANT
LIQUID BAND (GAUZE/BANDAGES/DRESSINGS) IMPLANT
NEEDLE HYPO 25X1 1.5 SAFETY (NEEDLE) ×4 IMPLANT
NS IRRIG 1000ML POUR BTL (IV SOLUTION) IMPLANT
PACK BASIN DAY SURGERY FS (CUSTOM PROCEDURE TRAY) ×4 IMPLANT
PENCIL BUTTON HOLSTER BLD 10FT (ELECTRODE) ×4 IMPLANT
SHEET MEDIUM DRAPE 40X70 STRL (DRAPES) IMPLANT
SLEEVE SCD COMPRESS KNEE MED (MISCELLANEOUS) ×4 IMPLANT
SPONGE GAUZE 4X4 12PLY STER LF (GAUZE/BANDAGES/DRESSINGS) ×4 IMPLANT
SPONGE LAP 4X18 X RAY DECT (DISPOSABLE) ×4 IMPLANT
STOCKINETTE IMPERVIOUS LG (DRAPES) IMPLANT
STRIP CLOSURE SKIN 1/2X4 (GAUZE/BANDAGES/DRESSINGS) ×3 IMPLANT
SUT ETHILON 2 0 FS 18 (SUTURE) IMPLANT
SUT MNCRL AB 4-0 PS2 18 (SUTURE) ×4 IMPLANT
SUT MON AB 4-0 PC3 18 (SUTURE) IMPLANT
SUT MON AB 5-0 PS2 18 (SUTURE) IMPLANT
SUT SILK 2 0 SH (SUTURE) IMPLANT
SUT VIC AB 2-0 SH 27 (SUTURE) ×8
SUT VIC AB 2-0 SH 27XBRD (SUTURE) ×4 IMPLANT
SUT VIC AB 3-0 SH 27 (SUTURE) ×4
SUT VIC AB 3-0 SH 27X BRD (SUTURE) ×2 IMPLANT
SUT VIC AB 5-0 PS2 18 (SUTURE) IMPLANT
SYR CONTROL 10ML LL (SYRINGE) ×4 IMPLANT
TOWEL OR 17X24 6PK STRL BLUE (TOWEL DISPOSABLE) ×4 IMPLANT
TOWEL OR NON WOVEN STRL DISP B (DISPOSABLE) ×4 IMPLANT
TUBE CONNECTING 20'X1/4 (TUBING) ×1
TUBE CONNECTING 20X1/4 (TUBING) ×3 IMPLANT
YANKAUER SUCT BULB TIP NO VENT (SUCTIONS) IMPLANT

## 2016-03-08 NOTE — Anesthesia Procedure Notes (Signed)
Anesthesia Regional Block:  Pectoralis block  Pre-Anesthetic Checklist: ,, timeout performed, Correct Patient, Correct Site, Correct Laterality, Correct Procedure, Correct Position, site marked, Risks and benefits discussed, Surgical consent,  Pre-op evaluation,  Post-op pain management  Laterality: Right  Prep: chloraprep       Needles:   Needle Type: Stimiplex     Needle Length: 9cm 9 cm     Additional Needles:  Procedures: ultrasound guided (picture in chart) Pectoralis block Narrative:  Injection made incrementally with aspirations every 5 mL.  Performed by: Personally  Anesthesiologist: Nolon Nations  Additional Notes: Patient tolerated well. Good fascial spread noted.

## 2016-03-08 NOTE — Anesthesia Procedure Notes (Signed)
Procedure Name: LMA Insertion Date/Time: 03/08/2016 1:00 PM Performed by: Kimetha Trulson D Pre-anesthesia Checklist: Patient identified, Emergency Drugs available, Suction available and Patient being monitored Patient Re-evaluated:Patient Re-evaluated prior to inductionOxygen Delivery Method: Circle system utilized Preoxygenation: Pre-oxygenation with 100% oxygen Intubation Type: IV induction Ventilation: Mask ventilation without difficulty LMA: LMA inserted LMA Size: 4.0 Number of attempts: 1 Airway Equipment and Method: Bite block Placement Confirmation: positive ETCO2 Tube secured with: Tape Dental Injury: Teeth and Oropharynx as per pre-operative assessment

## 2016-03-08 NOTE — Op Note (Signed)
Preoperative diagnosis: Right breast cancer, clinical stage 2 s/p primary chemotherapy Postoperative diagnosis: same as above Procedure: 1. Right breast seed bracketed lumpectomy 2. Rightaxillary sentinel nodebiopsy and excision of prior positive node with seed guidance (right targeted axillary dissection) 3. Injection blue dye for sentinel node identification 4. Port removal Surgeon Dr Serita Grammes Anes general with pectoral block EBL: minimal Comps none Specimen:  1. Rightbreast marked with paint containing seed (this was dcis prior biopsy) 2. Right breast tissue marked with paint with seed and clip (this was mass and invasive disease) 3. Right axillary node that is seed containing (this was not sn) 4. Right axillary sentinel nodes with count of 41 Sponge count correct at completion dispo to recovery stable  Indications: This is a 84 yof who had node positive right breast cancer. She had two separate biopsies that were dcis and invasive disease.  She has had great clinical and radiologic response. We discussed lumpectomy with placement of two seeds as well as all options for nodal disease. She has elected to proceed with TAD.  She had three seeds placed prior to beginning.   Procedure: After informed consent was obtained the patient was taken to the OR. She was injected with technetium in the standard periareolar fashion. She underwent a pectoral block. She was given anitibiotics. SCDs were in place. She was prepped and draped in the standard sterile surgical fashion. A timeout was performed. I injected a saline/methylene blue mixture in a periareolar fashion.  I first removed the port. I infiltrated marcaine and reentered my old incision.  I then removed the port and line in their entirety.  Hemostasis was obtained. I closed this with 3-0 vicryl and 4-0 monocryl.   I then made located the seeds in the right medial inferior breast. I infiltrated marcaine and made a periareolar  incision. I used the lighted retractor system to tunnel to the lesion. I then removed the seed with an attempt to get a clear margin. This underwent specimen mm that showed the seed in placed at prior dcis biopsy (there was no clip there).  I then located the other seed in an area of tissue at the im fold near the skin that was contiguous with the first area. This was also excised.  The entire posterior margin is the muscle and the inferior margin is at the fold.  I confirmed removal of the seed and clip with a mammogram.  I placed clips in the cavity.Hemostasis was observed.  I closed the breast tissue with 2-0 vicryl. I closed the skin with 3-0 vicryl and 5-0 monocryl. Glue was placed. I then made an incision in the axilla. I went through the axillary fascia. I identified the seed containing node and excised this. The seed was in the soft tissue next to this and sent separately.  I did a mm that showed the clip was in this node.  I identified the sentinel nodes with the highest count as above. There was no background radioactivity. I obtained hemostasis. I then closed the fascia with 2-0 vicryl. I placed arista in the entire cavity. The skin was closed with 3-0 vicryl and 4-0 monocryl. due to allergy to glue I placed benzoin, steristrips and dressings.  A breast binder was placed. She was extubated and transferred to recovery stable

## 2016-03-08 NOTE — Anesthesia Preprocedure Evaluation (Addendum)
Anesthesia Evaluation  Patient identified by MRN, date of birth, ID band Patient awake    Reviewed: Allergy & Precautions, NPO status , Patient's Chart, lab work & pertinent test results  History of Anesthesia Complications (+) PONV and history of anesthetic complications  Airway Mallampati: II  TM Distance: >3 FB Neck ROM: Full    Dental no notable dental hx.    Pulmonary neg pulmonary ROS,    Pulmonary exam normal breath sounds clear to auscultation       Cardiovascular negative cardio ROS Normal cardiovascular exam Rhythm:Regular Rate:Normal     Neuro/Psych negative neurological ROS  negative psych ROS   GI/Hepatic negative GI ROS, Neg liver ROS,   Endo/Other  negative endocrine ROS  Renal/GU negative Renal ROS     Musculoskeletal negative musculoskeletal ROS (+)   Abdominal   Peds  Hematology negative hematology ROS (+)   Anesthesia Other Findings   Reproductive/Obstetrics negative OB ROS                            Anesthesia Physical Anesthesia Plan  ASA: II  Anesthesia Plan: General   Post-op Pain Management: GA combined w/ Regional for post-op pain   Induction: Intravenous  Airway Management Planned: LMA  Additional Equipment:   Intra-op Plan:   Post-operative Plan: Extubation in OR  Informed Consent: I have reviewed the patients History and Physical, chart, labs and discussed the procedure including the risks, benefits and alternatives for the proposed anesthesia with the patient or authorized representative who has indicated his/her understanding and acceptance.   Dental advisory given  Plan Discussed with: CRNA  Anesthesia Plan Comments:         Anesthesia Quick Evaluation

## 2016-03-08 NOTE — H&P (Signed)
89 yof who underwent screening mm that shows d density breasts. she had no breast complaints prior to mm. there were punctate calcs at 2 oclock posteriorly in the right breast and at 7 oclock in the left breast. magnification views were performed that showed a 1.2 cm irregular mass in the right breast and the cals in the left breast. the left breast was recommended to have 6 month follow up as these appear benign. the right breast then underwent further evaluation. [place] was done with finding of 1.3 cm lobulated mas and a 1.7 cm node in right axilla also. biops was performed of three separate areas. the mass is really in the im fold. the calcs and the mass are total of 2.5 cm. 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 the node is positive. 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 she also had left sided calcs on mm that need six month follow up. she has undergone primary chemotherapy that she has tolerated well. the follow up mri shows that the mass is now 2x3x2 mm in size, left breast normal and the nodes are all normal as well. she is here to discuss surgery   Other Problems [name], MD; 02/23/2016 9:50 PM) Lump In Breast Breast Cancer Back Pain  Past Surgical History [name], MD; 02/23/2016 9:50 PM) Spinal Surgery Midback Spinal Surgery - Neck  Allergies Davy Pique Bynum, CMA; 02/23/2016 2:41 PM) Dermabase *DERMATOLOGICALS* Itching, Rash. Allergic to Dermabond glue  Medication History [name], MD; 02/23/2016 9:50 PM) Medications Reconciled Valium (5MG  Tablet, 1 (one) Tablet Oral one time dose, Taken starting 02/23/2016) Active. Keflex (500MG  Capsule, 1 (one) Capsule Oral two times daily, Taken starting 09/14/2015) Active. Dexamethasone (4MG  Tablet, Oral) Active. Ondansetron HCl (8MG  Tablet, Oral) Active. LORazepam (0.5MG  Tablet,  Oral) Active. Lidocaine-Prilocaine (2.5-2.5% Cream, External) Active. Prochlorperazine Maleate (10MG  Tablet, Oral) Active.  Social History [name], MD; 02/23/2016 9:50 PM) No drug use Tobacco use Never smoker. Alcohol use Occasional alcohol use. Caffeine use Carbonated beverages, Tea.  Family History [name], MD; 02/23/2016 9:50 PM) Migraine Headache Sister. Ischemic Bowel Disease Mother.  Vitals (Sonya Bynum CMA; 02/23/2016 2:41 PM) 02/23/2016 2:40 PM Weight: 171 lb Height: 68in Body Surface Area: 1.91 m Body Mass Index: 26 kg/m  Pulse: 77 (Regular)  BP: 128/82 (Sitting, Left Arm, Standard)   Physical Exam [name] MD; 02/23/2016 9:51 PM) General Mental Status-Alert. Orientation-Oriented X3. Chest and Lung Exam Chest and lung exam reveals -quiet, even and easy respiratory effort with no use of accessory muscles and on auscultation, normal breath sounds, no adventitious sounds and normal vocal resonance. Breast Nipples-No Discharge. Breast Lump-No Palpable Breast Mass. Cardiovascular Cardiovascular examination reveals -normal heart sounds, regular rate and rhythm with no murmurs. Lymphatic Head & Neck General Head & Neck Lymphatics: Bilateral - Description - Normal. Axillary General Axillary Region: Bilateral - Description - Normal. 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 she understands there is chance of positive margins necessitating a second surgery. she also understands she will need radiotherapy. We  discussed at length options for nodes. we discussed risk of lymphedema and survival of  each of following options: alnd, enrollment in alliance trial now for pos node preeop that is negative and we discussed intraop randomization, tad. after a long discussion (she does not want to be randomized intraop) we elected to do a targeted axillary dissection with seed removal of prior biopsied node and sn at same time with dual tracer. she understands risk of this. she also understands that she might be recommended to return to or for alnd. will also ask Dr Jana Hakim if he is ok with port removal as she would like this also.

## 2016-03-08 NOTE — Progress Notes (Signed)
Assisted Dr. Germeroth with right, ultrasound guided, pectoralis block. Side rails up, monitors on throughout procedure. See vital signs in flow sheet. Tolerated Procedure well. 

## 2016-03-08 NOTE — Transfer of Care (Signed)
Immediate Anesthesia Transfer of Care Note  Patient: Michele Alvarez  Procedure(s) Performed: Procedure(s): RIGHT BREAST BRACKETED RADIOACTIVE SEED GUIDED LUMPECTOMY RIGHT AXILLARY SENTINEL LYMPH NODE BIOPSY AND RIGHT RADIOACTIVE SEED TARGETED AXILLARY NODE DISSECTION (Right) REMOVAL PORT-A-CATH (Right)  Patient Location: PACU  Anesthesia Type:GA combined with regional for post-op pain  Level of Consciousness: awake and patient cooperative  Airway & Oxygen Therapy: Patient Spontanous Breathing and Patient connected to face mask oxygen  Post-op Assessment: Report given to RN and Post -op Vital signs reviewed and stable  Post vital signs: Reviewed and stable  Last Vitals:  Vitals:   03/08/16 1235 03/08/16 1240  BP:  108/63  Pulse: (!) 102 (!) 108  Resp: 15 15  Temp:      Last Pain:  Vitals:   03/08/16 1150  TempSrc: Oral         Complications: No apparent anesthesia complications

## 2016-03-08 NOTE — Anesthesia Postprocedure Evaluation (Signed)
Anesthesia Post Note  Patient: Michele Alvarez  Procedure(s) Performed: Procedure(s) (LRB): RIGHT BREAST BRACKETED RADIOACTIVE SEED GUIDED LUMPECTOMY RIGHT AXILLARY SENTINEL LYMPH NODE BIOPSY AND RIGHT RADIOACTIVE SEED TARGETED AXILLARY NODE DISSECTION (Right) REMOVAL PORT-A-CATH (Right)  Patient location during evaluation: PACU Anesthesia Type: General and Regional Level of consciousness: sedated and patient cooperative Pain management: pain level controlled Vital Signs Assessment: post-procedure vital signs reviewed and stable Respiratory status: spontaneous breathing Cardiovascular status: stable Anesthetic complications: no    Last Vitals:  Vitals:   03/08/16 1445 03/08/16 1500  BP: 118/68 109/62  Pulse:    Resp:    Temp:      Last Pain:  Vitals:   03/08/16 1515  TempSrc:   PainSc: [place]

## 2016-03-08 NOTE — Discharge Instructions (Signed)
[facility] °Office Phone Number [phone] ° °POST OP INSTRUCTIONS ° °Always review your discharge instruction sheet given to you by the facility where your surgery was performed. ° °IF YOU HAVE DISABILITY OR FAMILY LEAVE FORMS, YOU MUST BRING THEM TO THE OFFICE FOR PROCESSING.  DO NOT GIVE THEM TO YOUR DOCTOR. ° °1. A prescription for pain medication may be given to you upon discharge.  Take your pain medication as prescribed, if needed.  If narcotic pain medicine is not needed, then you may take acetaminophen (Tylenol), naprosyn (Alleve) or ibuprofen (Advil) as needed. °2. Take your usually prescribed medications unless otherwise directed °3. If you need a refill on your pain medication, please contact your pharmacy.  They will contact our office to request authorization.  Prescriptions will not be filled after 5pm or on week-ends. °4. You should eat very light the first 24 hours after surgery, such as soup, crackers, pudding, etc.  Resume your normal diet the day after surgery. °5. Most patients will experience some swelling and bruising in the breast.  Ice packs and a good support bra will help.  Wear the breast binder provided or a sports bra for 72 hours day and night.  After that wear a sports bra during the day until you return to the office. Swelling and bruising can take several days to resolve.  °6. It is common to experience some constipation if taking pain medication after surgery.  Increasing fluid intake and taking a stool softener will usually help or prevent this problem from occurring.  A mild laxative (Milk of Magnesia or Miralax) should be taken according to package directions if there are no bowel movements after 48 hours. °7. Unless discharge instructions indicate otherwise, you may remove your bandages 48 hours after surgery and you may shower at that time.  You may have steri-strips (small skin tapes) in place directly over the incision.  These strips should be left on the  skin for 7-10 days and will come off on their own.  If your surgeon used skin glue on the incision, you may shower in 24 hours.  The glue will flake off over the next 2-3 weeks.  Any sutures or staples will be removed at the office during your follow-up visit. °8. ACTIVITIES:  You may resume regular daily activities (gradually increasing) beginning the next day.  Wearing a good support bra or sports bra minimizes pain and swelling.  You may have sexual intercourse when it is comfortable. °a. You may drive when you no longer are taking prescription pain medication, you can comfortably wear a seatbelt, and you can safely maneuver your car and apply brakes. °b. RETURN TO WORK:  ______________________________________________________________________________________ °9. You should see your doctor in the office for a follow-up appointment approximately two weeks after your surgery.  Your doctor’s nurse will typically make your follow-up appointment when she calls you with your pathology report.  Expect your pathology report 3-4 business days after your surgery.  You may call to check if you do not hear from us after three days. °10. OTHER INSTRUCTIONS: _______________________________________________________________________________________________ _____________________________________________________________________________________________________________________________________ °_____________________________________________________________________________________________________________________________________ °_____________________________________________________________________________________________________________________________________ ° °WHEN TO CALL DR Cohan Stipes: °1. Fever over 101.0 °2. Nausea and/or vomiting. °3. Extreme swelling or bruising. °4. Continued bleeding from incision. °5. Increased pain, redness, or drainage from the incision. ° °The clinic staff is available to answer your questions during regular  business hours.  Please don’t hesitate to call and ask to speak to one of the nurses for clinical concerns.  If   you have a medical emergency, go to the nearest emergency room or call 911.  A surgeon from [facility] is always on call at the hospital. ° °For further questions, please visit [email or web address] ° ° ° °Post Anesthesia Home Care Instructions ° °Activity: °Get plenty of rest for the remainder of the day. A responsible adult should stay with you for 24 hours following the procedure.  °For the next 24 hours, DO NOT: °-Drive a car °-Operate machinery °-Drink alcoholic beverages °-Take any medication unless instructed by your physician °-Make any legal decisions or sign important papers. ° °Meals: °Start with liquid foods such as gelatin or soup. Progress to regular foods as tolerated. Avoid greasy, spicy, heavy foods. If nausea and/or vomiting occur, drink only clear liquids until the nausea and/or vomiting subsides. Call your physician if vomiting continues. ° °Special Instructions/Symptoms: °Your throat may feel dry or sore from the anesthesia or the breathing tube placed in your throat during surgery. If this causes discomfort, gargle with warm salt water. The discomfort should disappear within 24 hours. ° °If you had a scopolamine patch placed behind your ear for the management of post- operative nausea and/or vomiting: ° °1. The medication in the patch is effective for 72 hours, after which it should be removed.  Wrap patch in a tissue and discard in the trash. Wash hands thoroughly with soap and water. °2. You may remove the patch earlier than 72 hours if you experience unpleasant side effects which may include dry mouth, dizziness or visual disturbances. °3. Avoid touching the patch. Wash your hands with soap and water after contact with the patch. °  ° °

## 2016-03-09 ENCOUNTER — Encounter (HOSPITAL_BASED_OUTPATIENT_CLINIC_OR_DEPARTMENT_OTHER): Payer: Self-pay | Admitting: General Surgery

## 2016-03-12 ENCOUNTER — Encounter: Payer: Self-pay | Admitting: Oncology

## 2016-03-12 ENCOUNTER — Other Ambulatory Visit: Payer: Self-pay | Admitting: Oncology

## 2016-03-27 NOTE — Progress Notes (Signed)
Location of Breast Cancer: Right Breast  Histology per Pathology Report:  03/08/16 Diagnosis 1. Breast, lumpectomy, Right - BREAST PARENCHYMA WITH NEOADJUVANT CHANGES. - FOCAL USUAL DUCTAL HYPERPLASIA. - NO ATYPIA OR TUMOR SEEN. - SEE COMMENT. 2. Breast, lumpectomy, Right - INVASIVE GRADE 2 DUCTAL CARCINOMA, SPANNING 0.7 CM IN GREATEST DIMENSION. - HIGH GRADE DUCTAL CARCINOMA IN SITU. - ANTERIOR MARGIN IS FOCALLY INVOLVED WITH INVASIVE DUCTAL CARCINOMA (INVASIVE TUMOR MERGES WITH CAUTERIZED ANTERIOR MARGIN). - HIGH GRADE DUCTAL CARCINOMA IN SITU IS FOCALLY 0.1 CM TO POSTERIOR MARGIN - OTHER MARGINS ARE NEGATIVE. - SEE ONCOLOGY TEMPLATE. 3. Lymph node, sentinel, biopsy, Right axillary - SCANT BENIGN LYMPHOID TISSUE WITH ASSOCIATED HISTIOCYTIC AND GIANT CELL REACTION CONSISTENT WITH A LYMPH NODE SHOWING EXTENSIVE NEOADJUVANT CHANGES (0/1). - NO TUMOR SEEN. 4. Lymph node, sentinel, biopsy, Right axillary - BENIGN FIBROFATTY SOFT TISSUE. - NO TUMOR SEEN. - LYMPH NODE TISSUE IS NOT IDENTIFIED.  Receptor Status: ER(90%), PR (70%), Her2-neu (NEG), Ki-(80%)  Did patient present with symptoms or was this found on screening mammography?: It was found on a screening mammogram.   Past/Anticipated interventions by surgeon, if any: 09/09/15 Procedure: right ij US guided powerport insertion Surgeon: Dr Serita Grammes  03/08/16 Procedure: 1. Right breast seed bracketed lumpectomy 2. Rightaxillary sentinel nodebiopsy and excision of prior positive node with seed guidance (right targeted axillary dissection) 3. Injection blue dye for sentinel node identification 4. Port removal Surgeon Dr Serita Grammes  Past/Anticipated interventions by medical oncology, if any: Dr. Jana Hakim 02/11/16 (1) neoadjuvant chemotherapy started 09/16/2015 consisting of doxorubicin and cyclophosphamide in dose dense fashion 4, completed 10/28/2015, followed by paclitaxel weekly 12  started 11/19/2015, completed  02/11/2016 (2) definitive surgery to follow with consideration of the Alliance trial (3) adjuvant radiation to follow surgery (4) adjuvant antiestrogens to begin at the completion of local therapy (5) participating in DCP-001 study   Lymphedema issues, if any:  She denies.   Pain issues, if any:  She reports some residual pain from her Lumpectomy. 1/10 she does not take anything for this pain. She has good arm movement in her Right arm. She saw PT this week, and she was sent home with exercises to do.   SAFETY ISSUES:  Prior radiation? No  Pacemaker/ICD? No  Possible current pregnancy? No  Is the patient on methotrexate? No  Current Complaints / other details:  She has neuropathy in her Right foot. She has burning, tingling, and it is heavy feeling. She has had problems chronically because of previous back surgery, which she feels has gotten worse since chemotherapy.   BP 109/79   Pulse 83   Temp 98.7 F (37.1 C)   Ht 5' 8"  (1.727 m)   Wt 168 lb 4.8 oz (76.3 kg)   SpO2 100% Comment: room air  BMI 25.59 kg/m    Wt Readings from Last 3 Encounters:  03/31/16 168 lb 4.8 oz (76.3 kg)  03/08/16 167 lb (75.8 kg)  02/11/16 171 lb 1.6 oz (77.6 kg)      Eria Lozoya, Stephani Police, RN 03/27/2016,11:31 AM

## 2016-03-29 ENCOUNTER — Ambulatory Visit: Payer: 59 | Attending: General Surgery | Admitting: Physical Therapy

## 2016-03-29 DIAGNOSIS — M25611 Stiffness of right shoulder, not elsewhere classified: Secondary | ICD-10-CM | POA: Diagnosis not present

## 2016-03-29 DIAGNOSIS — M25511 Pain in right shoulder: Secondary | ICD-10-CM | POA: Insufficient documentation

## 2016-03-29 NOTE — Therapy (Signed)
[place], [place], [address] Phone: [phone]   Fax:  [phone]  Physical Therapy Evaluation  Patient Details  Name: Michele Alvarez MRN: [id number] Date of Birth: 08-12-1960 Referring Provider: Dr. [name]  Encounter Date: 03/29/2016      PT End of Session - 03/29/16 0903    Visit Number 1   Number of Visits 1   PT Start Time 0802   PT Stop Time 0850   PT Time Calculation (min) 48 min   Activity Tolerance Patient tolerated treatment well   Behavior During Therapy [facility] for tasks assessed/performed      Past Medical History:  Diagnosis Date  . Breast cancer of lower-outer quadrant of right female breast ([place]) 08/19/2015  . Hot flashes   . PONV (postoperative nausea and vomiting)     Past Surgical History:  Procedure Laterality Date  . BACK SURGERY     x2, lumbar  . BREAST LUMPECTOMY WITH RADIOACTIVE SEED AND AXILLARY LYMPH NODE DISSECTION Right 03/08/2016   Procedure: RIGHT BREAST BRACKETED RADIOACTIVE SEED GUIDED LUMPECTOMY RIGHT AXILLARY SENTINEL LYMPH NODE BIOPSY AND RIGHT RADIOACTIVE SEED TARGETED AXILLARY NODE DISSECTION;  Surgeon: [name], MD;  Location: [place];  Service: General;  Laterality: Right;  . NECK SURGERY  2001  . PORT-A-CATH REMOVAL Right 03/08/2016   Procedure: REMOVAL PORT-A-CATH;  Surgeon: [name], MD;  Location: [place];  Service: General;  Laterality: Right;  . PORTACATH PLACEMENT Right 09/09/2015   Procedure: INSERTION PORT-A-CATH WITH [place] ;  Surgeon: [name], MD;  Location: [place];  Service: General;  Laterality: Right;    There were no vitals filed for this visit.       Subjective Assessment - 03/29/16 0809    Subjective Here for follow-up nearly three weeks after lumpectomy.   Pertinent History Right breast cancer diagnosed in February 2017 and had neo-adjuvant  chemo from March through July.  Lumpectomy with SLNB 03/08/16 and will have XRT; sees rad onc this Friday.  h/o two back surgeries, both L5-S1 microdiscectomies in 2005 and 2014 or 2015, with poor results.  Still has symptoms, including radiculopathy in right leg.  She reports numbness in her right heel and that it feels like she walks with socks wadded up in her shoe; it gets hard and burns and tingles.  Had PT and it just kept getting worse so she just lives with it.  The foot feels harder the more she walks and her walking distance is limited.  Has a "weird sensation" in right foot, difficult to describe.   Patient Stated Goals follow-up from lumpectomy   Currently in Pain? Yes   Pain Score 2    Pain Location Breast   Pain Orientation Right   Pain Descriptors / Indicators Sore  and back of upper arm stings, burns   Aggravating Factors  getting a cold chill, which makes nipple draw up; flexing a muscle bothers upper arm   Pain Relieving Factors time            [facility] PT Assessment - 03/29/16 0001      Assessment   Medical Diagnosis right breast cancer s/p lumpectomy and SLNB   Referring Provider Dr. [name]   Onset Date/Surgical Date 03/08/16   Hand Dominance Left     Precautions   Precautions Other (comment)   Precaution Comments cancer precautions     Restrictions   Weight Bearing Restrictions No  Balance Screen   Has the patient fallen in the past 6 months No   Has the patient had a decrease in activity level because of a fear of falling?  No   Is the patient reluctant to leave their home because of a fear of falling?  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 since starting chemo     Cognition   Overall Cognitive Status Within Functional Limits for tasks assessed     Observation/Other Assessments   Skin Integrity intact; port was removed at time of surgery and spot has steri strips in place; SLNB incision healing well; lumpectomy incision at inferior aspect of areola is with three steri strips in place and looks healthy   Quick DASH  2.27     ROM / Strength   AROM / PROM / Strength AROM     AROM   AROM Assessment Site Shoulder   Right/Left Shoulder Right;Left   Right Shoulder Flexion 133 Degrees   Right Shoulder ABduction 108 Degrees   Right Shoulder Internal Rotation --  in supine:  WFL   Right Shoulder External Rotation --  in supine: WFL   Left Shoulder Flexion 169 Degrees   Left Shoulder ABduction 176 Degrees   Left Shoulder Internal Rotation --  in supine: [facility]   Left Shoulder External Rotation --  in supine: WFL           LYMPHEDEMA/ONCOLOGY QUESTIONNAIRE - 03/29/16 0823      Type   Cancer Type right breast     Surgeries   Lumpectomy Date 03/08/16   Sentinel Lymph Node Biopsy Date 03/08/16   Number Lymph Nodes Removed 1     Treatment   Past Chemotherapy Treatment Yes   Date 02/11/16   Active Radiation Treatment No  will start soon     Lymphedema Assessments   Lymphedema Assessments Upper extremities     Right Upper Extremity Lymphedema   10 cm Proximal to Olecranon Process 27.8 cm   Olecranon Process 23.9 cm   10 cm Proximal to Ulnar Styloid Process 20.5 cm   Just Proximal to Ulnar Styloid Process 15.1 cm   Across Hand at [employer] 19.1 cm   At [place] of 2nd Digit 5.7 cm     Left Upper Extremity Lymphedema   10 cm Proximal to Olecranon Process 27.7 cm   Olecranon Process 23.7 cm   10 cm Proximal to Ulnar Styloid Process 21.3  cm   Just Proximal to Ulnar Styloid Process 15.4 cm   Across Hand at [employer] 19.6 cm   At [place] of 2nd Digit 6 cm           Quick Dash - 03/29/16 0001    Open a tight or new jar Mild difficulty   Do heavy household chores (wash walls, wash floors) Mild difficulty   Carry a shopping bag or briefcase No difficulty   Wash your back No difficulty  Use a knife to cut food No difficulty   During the past week, to what extent has your arm, shoulder or hand problem interfered with your normal social activities with family, friends, neighbors, or groups? Not at all   During the past week, to what extent has your arm, shoulder or hand problem limited your work or other regular daily activities Not at all   Arm, shoulder, or hand pain. None   Tingling (pins and needles) in your arm, shoulder, or hand None   Difficulty Sleeping No difficulty   DASH Score 2.27 %                     PT Education - 03/29/16 0902    Education provided Yes   Education Details ABC class handouts for lymphedema education and exercise info; where and how to obtain a compression sleeve   Person(s) Educated Patient   Methods Explanation;Handout   Comprehension Need further instruction;Verbalized understanding                [place] - 03/29/16 0909      CC Long Term Goal  #1   Title Patient will be aware of lymphedema risk reduction practices and knowledgeable about where and how to obtain a compression sleeve for prophylaxis.   Status Achieved            Plan - 03/29/16 0903    Clinical Impression Statement Patient has limited AROM in right UE compared to left, but is overall doing well three weeks s/p right breast lumpectomy and SLNB.  She would benefit from attending ABC class to reinforce information about lymphedema risk reduction and additional stretches that was introduced today.   Rehab Potential Excellent   PT Frequency One time visit   PT  Treatment/Interventions Patient/family education   PT Next Visit Plan No further therapy planned at this time.   PT Home Exercise Plan Continue ROM exercises given by Dr. Donne Hazel and continue using right arm for functional activities.   Recommended Other Services Suggested she attend ABC class, but she is returning to work next week so may not be able to come; she was given handouts from the class to read.      Patient will benefit from skilled therapeutic intervention in order to improve the following deficits and impairments:  Decreased range of motion, Impaired UE functional use  Visit Diagnosis: Stiffness of right shoulder, not elsewhere classified - Plan: PT plan of care cert/re-cert  Pain in right shoulder - Plan: PT plan of care cert/re-cert     Problem List Patient Active Problem List   Diagnosis Date Noted  . Breast cancer of lower-outer quadrant of right female breast ([place]) 08/19/2015    SALIS[place]Y,DONNA 03/29/2016, 9:16 AM  [place], [place], [address] Phone: [phone]   Fax:  [phone]  Name: MIRISSA EDGECOMBE MRN: [id number] Date of Birth: 1961/01/16  Serafina Royals, PT 03/29/16 9:16 AM

## 2016-03-31 ENCOUNTER — Ambulatory Visit
Admission: RE | Admit: 2016-03-31 | Discharge: 2016-03-31 | Disposition: A | Discharge status: Home / Self Care | Payer: 59 | Source: Ambulatory Visit | Attending: Radiation Oncology | Admitting: Radiation Oncology

## 2016-03-31 ENCOUNTER — Encounter: Payer: Self-pay | Admitting: Radiation Oncology

## 2016-03-31 DIAGNOSIS — C50511 Malignant neoplasm of lower-outer quadrant of right female breast: Secondary | ICD-10-CM | POA: Insufficient documentation

## 2016-03-31 DIAGNOSIS — Z510 Encounter for antineoplastic radiation therapy: Secondary | ICD-10-CM | POA: Insufficient documentation

## 2016-03-31 NOTE — Progress Notes (Signed)
Radiation Oncology         [phone]) [phone] ________________________________  Initial outpatient Consultation  Name: Michele Alvarez MRN: [id number]  Date: 03/31/2016  DOB: Nov 26, 1960  [id number],JFHLKT A, MD  Magrinat, Virgie Dad, MD   REFERRING PHYSICIAN: Magrinat, Virgie Dad, MD  DIAGNOSIS:    ICD-9-CM ICD-10-CM   1. 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 female who I met earlier this year at breat clinic.  Since then, she underwent neoadjuvant chemotherapy consisting of dose dense AC followed by T.   The patient had a long discussion with Dr. Donne Hazel about surgical options. She did not want to be randomized intraoperatively. She did not want to be on the [place] trail. She elected for ta targeted axilary dissection with seed removal of prior biopsied node and sentinel nodes at the same time. The single lymph node that was removed had extensive neoadjuvant changes and was negative.  She also underwent a right breast lumpectomy on 03/08/16. This was performed by Dr. Donne Hazel. This revealed a 0.7 cm residual tumor, with invasive and in situ ductal carcinoma. I communicated with Dr. Donne Hazel and her margins are considered negative and no more nodes are going to be removed.  There was not much axillary tissue appreciated at surgery, the single node removed was a SLN.  Patient notes residual pain from her lumpectomy. She rates the pain 1/10 and she does not take anything for the pain. She has good movement. Her physical therapist has given her exercises to do to improve movement. 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 She has burning, tingling, and it is a heavy feeling. She has had chronic problems due to back surgery. She states these problems have worsened since  chemotherapy.  She is returning to work soon.  Denies chance of current pregnancy.  PREVIOUS RADIATION THERAPY: No  PAST MEDICAL HISTORY:  has a past medical history of Breast cancer of lower-outer quadrant of right female breast ([place]) (08/19/2015); Hot flashes; and PONV (postoperative nausea and vomiting).    PAST SURGICAL HISTORY: Past Surgical History:  Procedure Laterality Date  . BACK SURGERY  2007, 2014   x2, lumbar surgery used same incision for both surgeries.   [place] BREAST LUMPECTOMY WITH RADIOACTIVE SEED AND AXILLARY LYMPH NODE DISSECTION Right 03/08/2016   Procedure: RIGHT BREAST BRACKETED RADIOACTIVE SEED GUIDED LUMPECTOMY RIGHT AXILLARY SENTINEL LYMPH NODE BIOPSY AND RIGHT RADIOACTIVE SEED TARGETED AXILLARY NODE DISSECTION;  Surgeon: [name], MD;  Location: [facility];  Service: General;  Laterality: Right;  . NECK SURGERY  2001  . PORT-A-CATH REMOVAL Right 03/08/2016   Procedure: REMOVAL PORT-A-CATH;  Surgeon: [name], MD;  Location: [facility];  Service: General;  Laterality: Right;  . PORTACATH PLACEMENT Right 09/09/2015   Procedure: INSERTION PORT-A-CATH WITH [place] ;  Surgeon: [name], MD;  Location: [facility];  Service: General;  Laterality: Right;    FAMILY HISTORY: family history is not on file.  SOCIAL HISTORY:  reports that she has never smoked. She has never used smokeless tobacco. She reports that she drinks alcohol. She reports that she does not use drugs.  ALLERGIES: Latex and Skin adhesives  MEDICATIONS:  Current Outpatient Prescriptions  Medication Sig Dispense Refill  . diazepam (  VALIUM) 5 MG tablet Take 1 tablet 1 hour before procedure; then 1 tablet right before procedure (Patient not taking: Reported on 03/31/2016) 2 tablet 0   No current facility-administered medications for this encounter.     REVIEW OF SYSTEMS:  Notable for that above.    PHYSICAL EXAM:    Vitals with  Age-Percentiles 09/01/2015  Length 172.7 cm  Systolic 139  Diastolic 80  Pulse 91  Respiration 18  Weight 73.891 kg  BMI 24.8  VISIT REPORT    General: Alert and oriented, in no acute distress - appears  younger than stated age. HEENT: Head is normocephalic. Extraocular movements are intact. Oropharynx is clear. Oral cavity is clear. Neck: Neck is supple, no palpable cervical or supraclavicular lymphadenopathy. Heart: Regular in rate and rhythm with no murmurs, rubs, or gallops.  Chest: Clear to auscultation bilaterally, with no rhonchi, wheezes, or rales. Extremities: No cyanosis or edema. Lymphatics: see Neck Exam Skin: No concerning lesions. Musculoskeletal: symmetric strength and muscle tone throughout. Good range of motion in shoulders. Neurologic: Cranial nerves II through XII are grossly intact. No obvious focalities. Speech is fluent. Coordination is intact. Psychiatric: Judgment and insight are intact. Affect is appropriate. Breasts: No palpable masses in left breast or axilla. She still has some steri strips over her right portacath site /lumpectomy site and these areas along with right axillary scar are healing well   ECOG = 0  0 - Asymptomatic (Fully active, able to carry on all predisease activities without restriction)  1 - Symptomatic but completely ambulatory (Restricted in physically strenuous activity but ambulatory and able to carry out work of a light or sedentary nature. For example, light housework, office work)  2 - Symptomatic, <50% in bed during the day (Ambulatory and capable of all self care but unable to carry out any work activities. Up and about more than 50% of waking hours)  3 - Symptomatic, >50% in bed, but not bedbound (Capable of only limited self-care, confined to bed or chair 50% or more of waking hours)  4 - Bedbound (Completely disabled. Cannot carry on any self-care. Totally confined to bed or chair)  5 - Death   Santiago Glad MM, Creech RH, Tormey  DC, et al. [phone]). "Toxicity and response criteria of the [facility]". Am. Evlyn Clines. Oncol. 5 (6): 649-55   LABORATORY DATA:  Lab Results  Component Value Date   WBC 12.9 (H) 02/11/2016   HGB 11.3 (L) 02/11/2016   HCT 34.3 (L) 02/11/2016   MCV 97.2 02/11/2016   PLT 213 02/11/2016   CMP     Component Value Date/Time   NA 145 02/11/2016 1024   K 4.3 02/11/2016 1024   CO2 27 02/11/2016 1024   GLUCOSE 117 02/11/2016 1024   BUN 12.8 02/11/2016 1024   CREATININE 0.8 02/11/2016 1024   CALCIUM 9.4 02/11/2016 1024   PROT 6.1 (L) 02/11/2016 1024   ALBUMIN 3.7 02/11/2016 1024   AST 27 02/11/2016 1024   ALT 40 02/11/2016 1024   ALKPHOS 97 02/11/2016 1024   BILITOT <0.30 02/11/2016 1024        RADIOGRAPHY: none new  IMPRESSION/PLAN: Right breast cancer   It was a pleasure meeting the patient today. We discussed the risks, benefits, and side effects of adjuvant radiotherapy. I recommend radiotherapy to the right breast and regional nodes to reduce her risk of locoregional recurrence by 2/3. Treatment is anticipated to last 6 weeks.  We spoke about acute effects including skin irritation  and fatigue as well as much less common late effects including internal organ injury or irritation. We spoke about the latest technology that is used to minimize the risk of late effects for patients undergoing radiotherapy to the breast or chest wall. No guarantees of treatment were given. The patient is enthusiastic about proceeding with treatment. I look forward to participating in the patient's care. Consent signed. Anticipate simulation in 1-2 weeks.  Offered the patient physical therapy for neuropathy in foot. She declines. Prescription for lymphedema sleeve was given to the patient. This is something her physical therapist recommended she gets in the event she needs it in the future. __________________________________________   Eppie Gibson, MD  This document serves as a  record of services personally performed by Eppie Gibson, MD. It was created on her behalf by Bethann Humble, a trained medical scribe. The creation of this record is based on the scribe's personal observations and the provider's statements to them. This document has been checked and approved by the attending provider.

## 2016-04-10 ENCOUNTER — Ambulatory Visit
Admission: RE | Admit: 2016-04-10 | Discharge: 2016-04-10 | Disposition: A | Discharge status: Home / Self Care | Payer: 59 | Source: Ambulatory Visit | Attending: Radiation Oncology | Admitting: Radiation Oncology

## 2016-04-10 DIAGNOSIS — C50511 Malignant neoplasm of lower-outer quadrant of right female breast: Secondary | ICD-10-CM

## 2016-04-10 DIAGNOSIS — Z510 Encounter for antineoplastic radiation therapy: Secondary | ICD-10-CM | POA: Diagnosis not present

## 2016-04-10 NOTE — Progress Notes (Signed)
  Radiation Oncology         [phone] ________________________________  Name: Michele Alvarez MRN: [id number]  Date: 04/10/2016  DOB: [birth year]  SIMULATION AND TREATMENT PLANNING NOTE    Outpatient  DIAGNOSIS:     ICD-9-CM ICD-10-CM   1. Breast cancer of lower-outer quadrant of right female breast ([place]) 174.5 C50.511     NARRATIVE:  The patient was brought to the [place].  Identity was confirmed.  All relevant records and images related to the planned course of therapy were reviewed.  The patient freely provided informed written consent to proceed with treatment after reviewing the details related to the planned course of therapy. The consent form was witnessed and verified by the simulation staff.    Then, the patient was set-up in a stable reproducible supine position for radiation therapy with her ipsilateral arm over her head, and her upper body secured in a custom-made Vac-lok device.  CT images were obtained.  Surface markings were placed.  The CT images were loaded into the planning software.    TREATMENT PLANNING NOTE: Treatment planning then occurred.  The radiation prescription was entered and confirmed.     A total of 5 medically necessary complex treatment devices were fabricated and supervised by me: 4 fields with MLCs for custom blocks to protect heart, and lungs;  and, a Vac-lok. MORE COMPLEX DEVICES MAY BE MADE IN DOSIMETRY FOR FIELD IN FIELD BEAMS FOR DOSE HOMOGENEITY.  I have requested : 3D Simulation  I have requested a DVH of the following structures: lungs, heart, lumpectomy cavity.    The patient will receive 50 Gy in 25 fractions to the right breast and regional nodes (SCV/axilla) with 4 fields.  This will be followed by a boost.  Optical Surface Tracking Plan:  Since intensity modulated radiotherapy (IMRT) and 3D conformal radiation treatment methods are predicated on accurate and precise positioning for treatment, intrafraction motion  monitoring is medically necessary to ensure accurate and safe treatment delivery. The ability to quantify intrafraction motion without excessive ionizing radiation dose can only be performed with optical surface tracking. Accordingly, surface imaging offers the opportunity to obtain 3D measurements of patient position throughout IMRT and 3D treatments without excessive radiation exposure. I am ordering optical surface tracking for this patient's upcoming course of radiotherapy.  ________________________________   Reference:  Ursula Alert, J, et al. Surface imaging-based analysis of intrafraction motion for breast radiotherapy patients.Journal of [place], n. 6, nov. 2014. ISSN [id number].  Available at: <http://www.jacmp.org/index.php/jacmp/article/view/4957>.    -----------------------------------  Eppie Gibson, MD

## 2016-04-12 DIAGNOSIS — Z510 Encounter for antineoplastic radiation therapy: Secondary | ICD-10-CM | POA: Diagnosis not present

## 2016-04-17 ENCOUNTER — Ambulatory Visit
Admission: RE | Admit: 2016-04-17 | Discharge: 2016-04-17 | Disposition: A | Discharge status: Home / Self Care | Payer: 59 | Source: Ambulatory Visit | Attending: Radiation Oncology | Admitting: Radiation Oncology

## 2016-04-17 DIAGNOSIS — Z510 Encounter for antineoplastic radiation therapy: Secondary | ICD-10-CM | POA: Diagnosis not present

## 2016-04-18 ENCOUNTER — Ambulatory Visit
Admission: RE | Admit: 2016-04-18 | Discharge: 2016-04-18 | Disposition: A | Discharge status: Home / Self Care | Payer: 59 | Source: Ambulatory Visit | Attending: Radiation Oncology | Admitting: Radiation Oncology

## 2016-04-18 DIAGNOSIS — C50511 Malignant neoplasm of lower-outer quadrant of right female breast: Secondary | ICD-10-CM | POA: Diagnosis present

## 2016-04-18 DIAGNOSIS — Z510 Encounter for antineoplastic radiation therapy: Secondary | ICD-10-CM | POA: Diagnosis not present

## 2016-04-18 MED ORDER — RADIAPLEXRX EX GEL
Freq: Once | CUTANEOUS | Status: AC
  Administered 2016-04-18: 16:00:00 via TOPICAL

## 2016-04-18 MED ORDER — ALRA NON-METALLIC DEODORANT (RAD-ONC)
1.0000 "application " | Freq: Once | TOPICAL | Status: AC
  Administered 2016-04-18: 1 via TOPICAL

## 2016-04-18 NOTE — Progress Notes (Signed)

## 2016-04-19 ENCOUNTER — Ambulatory Visit
Admission: RE | Admit: 2016-04-19 | Discharge: 2016-04-19 | Disposition: A | Discharge status: Home / Self Care | Payer: 59 | Source: Ambulatory Visit | Attending: Radiation Oncology | Admitting: Radiation Oncology

## 2016-04-19 DIAGNOSIS — Z510 Encounter for antineoplastic radiation therapy: Secondary | ICD-10-CM | POA: Diagnosis not present

## 2016-04-20 ENCOUNTER — Ambulatory Visit
Admission: RE | Admit: 2016-04-20 | Discharge: 2016-04-20 | Disposition: A | Discharge status: Home / Self Care | Payer: 59 | Source: Ambulatory Visit | Attending: Radiation Oncology | Admitting: Radiation Oncology

## 2016-04-20 DIAGNOSIS — Z510 Encounter for antineoplastic radiation therapy: Secondary | ICD-10-CM | POA: Diagnosis not present

## 2016-04-21 ENCOUNTER — Ambulatory Visit: Payer: 59

## 2016-04-24 ENCOUNTER — Encounter: Payer: Self-pay | Admitting: Radiation Oncology

## 2016-04-24 ENCOUNTER — Ambulatory Visit
Admission: RE | Admit: 2016-04-24 | Discharge: 2016-04-24 | Disposition: A | Discharge status: Home / Self Care | Payer: 59 | Source: Ambulatory Visit | Attending: Radiation Oncology | Admitting: Radiation Oncology

## 2016-04-24 VITALS — BP 123/77 | HR 71 | Temp 98.3°F | Ht 68.0 in | Wt 167.8 lb

## 2016-04-24 DIAGNOSIS — Z510 Encounter for antineoplastic radiation therapy: Secondary | ICD-10-CM | POA: Diagnosis not present

## 2016-04-24 DIAGNOSIS — Z170 Estrogen receptor positive status [ER+]: Principal | ICD-10-CM

## 2016-04-24 DIAGNOSIS — C50511 Malignant neoplasm of lower-outer quadrant of right female breast: Secondary | ICD-10-CM

## 2016-04-24 NOTE — Progress Notes (Signed)
Michele Alvarez is here for her 4th fraction of radiation to her Right Breast. She denies pain or fatigue. The skin to her Right Breast is intact, and she is using the Radiaplex twice daily.  BP 123/77   Pulse 71   Temp 98.3 F (36.8 C)   Ht 5\' 8"  (1.727 m)   Wt 167 lb 12.8 oz (76.1 kg)   BMI 25.51 kg/m    Wt Readings from Last 3 Encounters:  04/24/16 167 lb 12.8 oz (76.1 kg)  03/31/16 168 lb 4.8 oz (76.3 kg)  03/08/16 167 lb (75.8 kg)

## 2016-04-24 NOTE — Progress Notes (Signed)
   Weekly Management Note:  Outpatient    ICD-9-CM ICD-10-CM   1. Malignant neoplasm of lower-outer quadrant of right breast of female, estrogen receptor positive ([facility]) 174.5 C50.511    V86.0 Z17.0     Current Dose:  8 Gy  Projected Dose: 60 Gy   Narrative:  The patient presents for routine under treatment assessment.  CBCT/MVCT images/Port film x-rays were reviewed.  The chart was checked.  No issues  Physical Findings:  height is 5\' 8"  (1.727 m) and weight is 167 lb 12.8 oz (76.1 kg). Her temperature is 98.3 F (36.8 C). Her blood pressure is 123/77 and her pulse is 71.   Wt Readings from Last 3 Encounters:  04/24/16 167 lb 12.8 oz (76.1 kg)  03/31/16 168 lb 4.8 oz (76.3 kg)  03/08/16 167 lb (75.8 kg)   NAD, no skin irritation over chest  Impression:  The patient is tolerating radiotherapy.  Plan:  Continue radiotherapy as planned. Patient instructed to apply Radiplex to intact skin in treatment fields.      ________________________________   Eppie Gibson, M.D.

## 2016-04-25 ENCOUNTER — Ambulatory Visit
Admission: RE | Admit: 2016-04-25 | Discharge: 2016-04-25 | Disposition: A | Discharge status: Home / Self Care | Payer: 59 | Source: Ambulatory Visit | Attending: Radiation Oncology | Admitting: Radiation Oncology

## 2016-04-25 DIAGNOSIS — Z510 Encounter for antineoplastic radiation therapy: Secondary | ICD-10-CM | POA: Diagnosis not present

## 2016-04-26 ENCOUNTER — Ambulatory Visit
Admission: RE | Admit: 2016-04-26 | Discharge: 2016-04-26 | Disposition: A | Discharge status: Home / Self Care | Payer: 59 | Source: Ambulatory Visit | Attending: Radiation Oncology | Admitting: Radiation Oncology

## 2016-04-26 DIAGNOSIS — Z510 Encounter for antineoplastic radiation therapy: Secondary | ICD-10-CM | POA: Diagnosis not present

## 2016-04-27 ENCOUNTER — Ambulatory Visit
Admission: RE | Admit: 2016-04-27 | Discharge: 2016-04-27 | Disposition: A | Discharge status: Home / Self Care | Payer: 59 | Source: Ambulatory Visit | Attending: Radiation Oncology | Admitting: Radiation Oncology

## 2016-04-27 DIAGNOSIS — Z510 Encounter for antineoplastic radiation therapy: Secondary | ICD-10-CM | POA: Diagnosis not present

## 2016-04-28 ENCOUNTER — Ambulatory Visit
Admission: RE | Admit: 2016-04-28 | Discharge: 2016-04-28 | Disposition: A | Discharge status: Home / Self Care | Payer: 59 | Source: Ambulatory Visit | Attending: Radiation Oncology | Admitting: Radiation Oncology

## 2016-04-28 DIAGNOSIS — Z510 Encounter for antineoplastic radiation therapy: Secondary | ICD-10-CM | POA: Diagnosis not present

## 2016-05-01 ENCOUNTER — Encounter: Payer: Self-pay | Admitting: Radiation Oncology

## 2016-05-01 ENCOUNTER — Ambulatory Visit
Admission: RE | Admit: 2016-05-01 | Discharge: 2016-05-01 | Disposition: A | Discharge status: Home / Self Care | Payer: 59 | Source: Ambulatory Visit | Attending: Radiation Oncology | Admitting: Radiation Oncology

## 2016-05-01 VITALS — BP 114/74 | HR 75 | Temp 98.6°F | Wt 168.0 lb

## 2016-05-01 DIAGNOSIS — Z510 Encounter for antineoplastic radiation therapy: Secondary | ICD-10-CM | POA: Diagnosis not present

## 2016-05-01 DIAGNOSIS — Z170 Estrogen receptor positive status [ER+]: Principal | ICD-10-CM

## 2016-05-01 DIAGNOSIS — C50511 Malignant neoplasm of lower-outer quadrant of right female breast: Secondary | ICD-10-CM

## 2016-05-01 NOTE — Progress Notes (Signed)
Ms. Whipp presents for her 9th fraction of radiation to her Right Breast. She denies pain, or fatigue. Her Right Breast is normal appearing, and she is using Radiaplex twice daily. She denies any other concerns at this time.   BP 114/74   Pulse 75   Temp 98.6 F (37 C)   Wt 168 lb (76.2 kg)   BMI 25.54 kg/m    Wt Readings from Last 3 Encounters:  05/01/16 168 lb (76.2 kg)  04/24/16 167 lb 12.8 oz (76.1 kg)  03/31/16 168 lb 4.8 oz (76.3 kg)

## 2016-05-01 NOTE — Progress Notes (Signed)
   Weekly Management Note:  Outpatient    ICD-9-CM ICD-10-CM   1. Malignant neoplasm of lower-outer quadrant of right breast of female, estrogen receptor positive (HCC) 174.5 C50.511    V86.0 Z17.0     Current Dose:  18 Gy  Projected Dose: 60 Gy   Narrative:  The patient presents for routine under treatment assessment.  CBCT/MVCT images/Port film x-rays were reviewed.  The chart was checked.  No issues  Physical Findings:  weight is 168 lb (76.2 kg). Her temperature is 98.6 F (37 C). Her blood pressure is 114/74 and her pulse is 75.   Wt Readings from Last 3 Encounters:  05/01/16 168 lb (76.2 kg)  04/24/16 167 lb 12.8 oz (76.1 kg)  03/31/16 168 lb 4.8 oz (76.3 kg)   NAD, no skin irritation over chest  Impression:  The patient is tolerating radiotherapy.  Plan:  Continue radiotherapy as planned. Patient instructed to continue to apply Radiplex to intact skin in treatment fields.      ________________________________   Eppie Gibson, M.D.

## 2016-05-02 ENCOUNTER — Ambulatory Visit (HOSPITAL_COMMUNITY)
Admission: RE | Admit: 2016-05-02 | Discharge: 2016-05-02 | Disposition: A | Discharge status: Home / Self Care | Payer: 59 | Source: Ambulatory Visit | Attending: Cardiology | Admitting: Cardiology

## 2016-05-02 ENCOUNTER — Other Ambulatory Visit: Payer: Self-pay | Admitting: *Deleted

## 2016-05-02 ENCOUNTER — Ambulatory Visit (HOSPITAL_BASED_OUTPATIENT_CLINIC_OR_DEPARTMENT_OTHER)
Admission: RE | Admit: 2016-05-02 | Discharge: 2016-05-02 | Disposition: A | Discharge status: Home / Self Care | Payer: 59 | Source: Ambulatory Visit | Attending: Family Medicine | Admitting: Family Medicine

## 2016-05-02 ENCOUNTER — Encounter (HOSPITAL_COMMUNITY): Payer: Self-pay

## 2016-05-02 ENCOUNTER — Ambulatory Visit
Admission: RE | Admit: 2016-05-02 | Discharge: 2016-05-02 | Disposition: A | Discharge status: Home / Self Care | Payer: 59 | Source: Ambulatory Visit | Attending: Radiation Oncology | Admitting: Radiation Oncology

## 2016-05-02 VITALS — BP 108/76 | HR 84 | Wt 169.2 lb

## 2016-05-02 DIAGNOSIS — I341 Nonrheumatic mitral (valve) prolapse: Secondary | ICD-10-CM | POA: Diagnosis not present

## 2016-05-02 DIAGNOSIS — C50511 Malignant neoplasm of lower-outer quadrant of right female breast: Secondary | ICD-10-CM | POA: Diagnosis not present

## 2016-05-02 DIAGNOSIS — Z510 Encounter for antineoplastic radiation therapy: Secondary | ICD-10-CM | POA: Diagnosis not present

## 2016-05-02 DIAGNOSIS — Z170 Estrogen receptor positive status [ER+]: Secondary | ICD-10-CM | POA: Insufficient documentation

## 2016-05-02 NOTE — Progress Notes (Signed)
  Echocardiogram 2D Echocardiogram has been performed.  Jennette Dubin 05/02/2016, 1:38 PM

## 2016-05-02 NOTE — Patient Instructions (Addendum)
We will contact you in 6 months to schedule your next appointment.  

## 2016-05-02 NOTE — Progress Notes (Signed)
   CARDIO-ONCOLOGY CLINIC NOTE  Patient ID: Michele Alvarez, female   DOB: 01-Jan-1961, 55 y.o.   MRN: [id number] Oncologist: Dr. Jana Hakim  Michele. Alvarez is a 55 yo with minimal past medical history who was diagnosed with breast cancer in 2/17.  ER+/PR+/HER2-.  Plan for neoadjuvant chemo with doxorubicin/cyclophosphamide x 4 cycles then paclitaxel weekly x 12 cycles.     Returns for routine f/u. She has completed doxorubicin + cyclophosphamide. Completing radiation 10/30.   Overall she feels good. Denies SOB/Orthopnea/edema. Able to walk 3 miles.  Working full time.   PMH: 1. Back pain. 2. Breast cancer: Diagnosed 2/17.  ER+/PR+/HER2-.  Plan for neoadjuvant chemo with doxorubicin/cyclophosphamide x 4 cycles then paclitaxel weekly x 12 cycles.  This will be followed by definitive surgery and radiation.   - Echo (3/17) with EF 55-60%, lateral s' 9.6, GLS -44.8%, normal diastolic function, normal RV size and systolic function.  - Echo (4/17) with EF 18-56%, normal diastolic function, lateral s' 13.3, GLS -19.7%, normal RV size and systolic function, mild MR.  -ECHO (04/2016) with EF 55-60% Lateral 11.9 GLS -20.0. Normal RV   SH: Lives in [place], nonsmoker.    FH: No family history of heart problems.   ROS: All systems reviewed and negative except as per HPI.   Prior to Admission medications   Not on File   Physical exam:  BP 108/76   Pulse 84   Wt 169 lb 4 oz (76.8 kg)   SpO2 100%   BMI 25.73 kg/m  General: NAD Neck: No JVD, no thyromegaly or thyroid nodule.  Lungs: Clear to auscultation bilaterally with normal respiratory effort. CV: Nondisplaced PMI.  Heart regular S1/S2, no S3/S4, no murmur.  No peripheral edema.  No carotid bruit.  Normal pedal pulses.  Abdomen: Soft, nontender, no hepatosplenomegaly, no distention.  Skin: Intact without lesions or rashes.  Neurologic: Alert and oriented x 3.  Psych: Normal affect. Extremities: No clubbing or cyanosis.  HEENT:  Normal.   Assessment/Plan: Michele Alvarez has completed 4 cycles of doxorubicin/cyclophosphamide and under going radiation 10/30. Todays ECHO was reviewed and discussed by Dr Haroldine Laws. Doppler parameters stable.   Follow up in 6 months. Plan to repeat ECHO at that time.   Amy Clegg NP-C  05/02/2016   Patient seen and examined with Darrick Grinder, NP. We discussed all aspects of the encounter. I agree with the assessment and plan as stated above.   I reviewed echos personally. EF and Doppler parameters stable. No HF on exam. Will see back in 6 months for repeat echo to ensure stability. I explained incidence of Adriamycin cardiotoxicity in detail include small possibility of very delayed toxicity.   Bensimhon, Daniel,MD 10:29 PM

## 2016-05-03 ENCOUNTER — Ambulatory Visit
Admission: RE | Admit: 2016-05-03 | Discharge: 2016-05-03 | Disposition: A | Discharge status: Home / Self Care | Payer: 59 | Source: Ambulatory Visit | Attending: Radiation Oncology | Admitting: Radiation Oncology

## 2016-05-03 ENCOUNTER — Other Ambulatory Visit (HOSPITAL_BASED_OUTPATIENT_CLINIC_OR_DEPARTMENT_OTHER): Payer: 59

## 2016-05-03 ENCOUNTER — Ambulatory Visit (HOSPITAL_BASED_OUTPATIENT_CLINIC_OR_DEPARTMENT_OTHER): Payer: 59 | Admitting: Oncology

## 2016-05-03 VITALS — BP 116/64 | HR 82 | Temp 98.1°F | Resp 18 | Ht 68.0 in | Wt 166.3 lb

## 2016-05-03 DIAGNOSIS — C773 Secondary and unspecified malignant neoplasm of axilla and upper limb lymph nodes: Secondary | ICD-10-CM

## 2016-05-03 DIAGNOSIS — C50511 Malignant neoplasm of lower-outer quadrant of right female breast: Secondary | ICD-10-CM

## 2016-05-03 DIAGNOSIS — Z170 Estrogen receptor positive status [ER+]: Secondary | ICD-10-CM

## 2016-05-03 DIAGNOSIS — Z510 Encounter for antineoplastic radiation therapy: Secondary | ICD-10-CM | POA: Diagnosis not present

## 2016-05-03 LAB — CBC WITH DIFFERENTIAL/PLATELET
BASO%: 0.3 % (ref 0.0–2.0)
Basophils Absolute: 0 10*3/uL (ref 0.0–0.1)
EOS%: 1.6 % (ref 0.0–7.0)
Eosinophils Absolute: 0.1 10*3/uL (ref 0.0–0.5)
HCT: 39.7 % (ref 34.8–46.6)
HGB: 13.3 g/dL (ref 11.6–15.9)
LYMPH%: 26.9 % (ref 14.0–49.7)
MCH: 31.5 pg (ref 25.1–34.0)
MCHC: 33.5 g/dL (ref 31.5–36.0)
MCV: 94.1 fL (ref 79.5–101.0)
MONO#: 0.3 10*3/uL (ref 0.1–0.9)
MONO%: 10.6 % (ref 0.0–14.0)
NEUT#: 1.9 10*3/uL (ref 1.5–6.5)
NEUT%: 60.6 % (ref 38.4–76.8)
PLATELETS: 201 10*3/uL (ref 145–400)
RBC: 4.22 10*6/uL (ref 3.70–5.45)
RDW: 13 % (ref 11.2–14.5)
WBC: 3.1 10*3/uL — ABNORMAL LOW (ref 3.9–10.3)
lymph#: 0.8 10*3/uL — ABNORMAL LOW (ref 0.9–3.3)

## 2016-05-03 LAB — COMPREHENSIVE METABOLIC PANEL
ALT: 21 U/L (ref 0–55)
ANION GAP: 9 meq/L (ref 3–11)
AST: 25 U/L (ref 5–34)
Albumin: 4 g/dL (ref 3.5–5.0)
Alkaline Phosphatase: 90 U/L (ref 40–150)
BUN: 14.4 mg/dL (ref 7.0–26.0)
CHLORIDE: 107 meq/L (ref 98–109)
CO2: 27 meq/L (ref 22–29)
Calcium: 9.7 mg/dL (ref 8.4–10.4)
Creatinine: 0.8 mg/dL (ref 0.6–1.1)
EGFR: 81 mL/min/{1.73_m2} — AB (ref >90)
Glucose: 105 mg/dl (ref 70–140)
Potassium: 4.4 mEq/L (ref 3.5–5.1)
SODIUM: 143 meq/L (ref 136–145)
Total Bilirubin: 0.82 mg/dL (ref 0.20–1.20)
Total Protein: 6.7 g/dL (ref 6.4–8.3)

## 2016-05-03 LAB — DRAW EXTRA CLOT TUBE

## 2016-05-03 NOTE — Progress Notes (Signed)
[place]  Telephone:([phone] Fax:([phone]) [phone]   ID: Zalaya Astarita Ingwersen DOB: [birth year]  MR#: [id number]  QMV#:[id number]  Patient Care Team: Jonathon Jordan, MD as PCP - General (Family Medicine) Avon Gully, NP as Nurse Practitioner (Obstetrics and Gynecology) [name], MD as Consulting Physician (General Surgery) Chauncey Cruel, MD as Consulting Physician (Oncology) Eppie Gibson, MD as Attending Physician (Radiation Oncology) Sylvan Cheese, NP as Nurse Practitioner (Hematology and Oncology) Jovita Gamma, MD as Consulting Physician (Neurosurgery) Benson Norway, RN as Registered Nurse PCP: Lilian Coma, MD OTHER MD:  CHIEF COMPLAINT: Node-positive breast cancer  CURRENT TREATMENT: Adjuvant radiation  BREAST CANCER HISTORY: From the original intake note:  "Michele Alvarez" had routine screening mammography at [facility] 08[facility]07/2015. There was an area of calcification in the right breast 2:00 position. There was also an area of calcification in the left breast 7:00 position. She was recalled for bilateral diagnostic mammography 08/12/2015. The left breast calcifications were felt to be likely benign and repeat mammography in 6 months was suggested.  On the right, breast density was category D. There was a 1.2 cm irregular mass at the 6:00, inframammary position and also suspicious calcifications. Adding those together the area measured 2.5 cm. Ultrasound of the right breast on the same day found a 1.3 cm lobulated mass in the right breast at the 6:00 position. There was a 1.7 cm axillary lymph node with a fatty hilum.   On 08/30/2015 the patient underwent biopsy of the breast mass, the abnormal lymph node, and the area of calcifications. The calcifications ([id number]) were associated with ductal carcinoma in situ, with the prognostic panel pending. The breast mass proved to be an invasive ductal carcinoma, grade 3, estrogen and progesterone  receptor positive, HER-2/neu nonamplified, with an MIB-1 of 80%. The lymph node also was positive.  Note that the clip to the patient's breast mass did not deploy.  The patient's subsequent history is as detailed below  INTERVAL HISTORY: Michele Alvarez returns for follow up of her breast cancer accompanied by her partner [name] and the patient's brother. Since her last visit here she underwent a right lumpectomy and sentinel lymph node sampling, on 03/08/2016. The final pathology ([id number]) confirmed a 0.7 cm residual invasive ductal carcinoma, grade 2 a with a focally positive anterior margin and a close but negative posterior margin for DCIS. These have been discussed with surgery and no further margin excision was felt to be necessary. The single sentinel lymph node was clear.  She then started radiation treatments, which are currently ongoing.  REVIEW OF SYSTEMS: Michele Alvarez did well with her surgery, without significant problems with pain, bleeding, or fever. She is pleased with the chimeric cosmetic result. She never had peripheral neuropathy from the chemotherapy, which she did have some nerve damage from her prior back surgery, particularly involving the right foot and the 3 middle toes of the left foot. This does not keep her from walking and does not make her unsteady, but it just feel strange. She has not had any periods since 2014. She is also not having any hot flashes or problems with vaginal dryness. She continues to work full-time. Her hair is coming in nicely. The right breast is a little sensitive and sometimes she has "shooting pains" which she understands are not uncommon and are benign. Otherwise a detailed review of systems today was stable  PAST MEDICAL HISTORY: Past Medical History:  Diagnosis Date  . Breast cancer of lower-outer quadrant of right female breast (  [place]) 08/19/2015  . Hot flashes   . PONV (postoperative nausea and vomiting)     PAST SURGICAL HISTORY: Past Surgical History:    Procedure Laterality Date  . BACK SURGERY  2007, 2014   x2, lumbar surgery used same incision for both surgeries.   Marland Kitchen BREAST LUMPECTOMY WITH RADIOACTIVE SEED AND AXILLARY LYMPH NODE DISSECTION Right 03/08/2016   Procedure: RIGHT BREAST BRACKETED RADIOACTIVE SEED GUIDED LUMPECTOMY RIGHT AXILLARY SENTINEL LYMPH NODE BIOPSY AND RIGHT RADIOACTIVE SEED TARGETED AXILLARY NODE DISSECTION;  Surgeon: [name], MD;  Location: [facility];  Service: General;  Laterality: Right;  . NECK SURGERY  10/07/99  . PORT-A-CATH REMOVAL Right 03/08/2016   Procedure: REMOVAL PORT-A-CATH;  Surgeon: [name], MD;  Location: [facility];  Service: General;  Laterality: Right;  . PORTACATH PLACEMENT Right 09/09/2015   Procedure: INSERTION PORT-A-CATH WITH [place] ;  Surgeon: [name], MD;  Location: [facility];  Service: General;  Laterality: Right;    FAMILY HISTORY No family history on file. The patient's father died in Oct 06, 2014 at the age of 76. He had significant COPD. The patient's mother died at the age of 42 from Parkinson's disease. The patient has 1 brother, 1 sister. There is no cancer history in the family to her knowledge.  GYNECOLOGIC HISTORY:  No LMP recorded. Patient is postmenopausal. Menarche age 67. She has GI asked P0. She stopped having periods in January 2014. She never took hormone replacement or oral contraceptives.  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 She lives with her partner, [name], and 2 cats.     ADVANCED DIRECTIVES: Not in place. At the 09/01/2015 clinic visit the patient was given the appropriate forms to complete and notarize at her discretion.   HEALTH MAINTENANCE: Social History  Substance Use Topics  . Smoking status: Never Smoker  . Smokeless tobacco: Never Used  . Alcohol use Yes     Comment: social, she drinks one alchohol drink daily.      Colonoscopy: Never   PAP:  Bone density:  Never  Lipid panel:  Allergies  Allergen Reactions  . Latex Other (See Comments)    Fever blisters  . Skin Adhesives Dermatitis    Reaction to dermabond, do not use  Is below her leg. He is a little isn't strange people on overdose of this area I have a patient who refuses to hear her CA-125 results  No current outpatient prescriptions on file.   No current facility-administered medications for this visit.     OBJECTIVE: Middle-aged white woman Years well Vitals:   05/03/16 1042  BP: 116/64  Pulse: 82  Resp: 18  Temp: 98.1 F (36.7 C)     Body mass index is 25.29 kg/m.    ECOG FS:0 - Asymptomatic  Sclerae unicteric, EOMs intact Oropharynx clear and moist No cervical or supraclavicular adenopathy Lungs no rales or rhonchi Heart regular rate and rhythm Abd soft, nontender, positive bowel sounds MSK no focal spinal tenderness, no upper extremity lymphedema Neuro: nonfocal, well oriented, appropriate affect Breasts: The right breast is status post lumpectomy and is currently receiving radiation. The cosmetic result is excellent. There is no erythema. There is no evidence of disease recurrence. The right axilla is benign. The left breast is unremarkable.    LAB RESULTS:  CMP     Component Value Date/Time   NA 143 05/03/2016 1002   K 4.4 05/03/2016 1002   CO2 27 05/03/2016 1002  GLUCOSE 105 05/03/2016 1002   BUN 14.4 05/03/2016 1002   CREATININE 0.8 05/03/2016 1002   CALCIUM 9.7 05/03/2016 1002   PROT 6.7 05/03/2016 1002   ALBUMIN 4.0 05/03/2016 1002   AST 25 05/03/2016 1002   ALT 21 05/03/2016 1002   ALKPHOS 90 05/03/2016 1002   BILITOT 0.82 05/03/2016 1002    INo results found for: SPEP, UPEP  Lab Results  Component Value Date   WBC 3.1 (L) 05/03/2016   NEUTROABS 1.9 05/03/2016   HGB 13.3 05/03/2016   HCT 39.7 05/03/2016   MCV 94.1 05/03/2016   PLT 201 05/03/2016      Chemistry      Component Value Date/Time   NA 143 05/03/2016 1002   K 4.4  05/03/2016 1002   CO2 27 05/03/2016 1002   BUN 14.4 05/03/2016 1002   CREATININE 0.8 05/03/2016 1002      Component Value Date/Time   CALCIUM 9.7 05/03/2016 1002   ALKPHOS 90 05/03/2016 1002   AST 25 05/03/2016 1002   ALT 21 05/03/2016 1002   BILITOT 0.82 05/03/2016 1002       No results found for: LABCA2  No components found for: LABCA125  No results for input(s): INR in the last 168 hours.  Urinalysis No results found for: COLORURINE, APPEARANCEUR, LABSPEC, PHURINE, GLUCOSEU, HGBUR, BILIRUBINUR, KETONESUR, PROTEINUR, UROBILINOGEN, NITRITE, LEUKOCYTESUR  ELIGIBLE FOR AVAILABLE RESEARCH PROTOCOL: [facility], [phone]  STUDIES: No results found.  ASSESSMENT: 55 y.o. [place] woman status post right breast lower outer quadrant and axillary lymph node biopsy 08/30/2015, both positive for a clinical T1-2 N1, stage II invasive ductal carcinoma, grade 3, estrogen and progesterone receptor positive, HER-2/neu nonamplified, with an MIB-1 of 80%  (1) neoadjuvant chemotherapy started 09/16/2015 consisting of doxorubicin and cyclophosphamide in dose dense fashion 4, completed 10/28/2015, followed by paclitaxel weekly 12  started 11/19/2015, completed 02/11/2016  (2) definitive surgery to follow with consideration of the [facility] trial  (3) adjuvant radiation to follow surgery  (4) adjuvant antiestrogens to begin at the completion of local therapy  (5) participating in DCP-001 study   PLAN: Benefit is tolerating her radiation treatments well. She should be done with them around mid November. Accordingly today we spent most of the 35 minute visit discussing anti-estrogens.  We discussed the difference between tamoxifen and anastrozole in detail. She understands that anastrozole and the aromatase inhibitors in general work by blocking estrogen production. Accordingly vaginal dryness, decrease in bone density, and of course hot flashes can result. The aromatase inhibitors can also  negatively affect the cholesterol profile, although that is a minor effect. One out of 5 women on aromatase inhibitors we will feel "old and achy". This arthralgia/myalgia syndrome, which resembles fibromyalgia clinically, does resolve with stopping the medications. Accordingly this is not a reason to not try an aromatase inhibitor but it is a frequent reason to stop it (in other words 20% of women will not be able to tolerate these medications).  Tamoxifen on the other hand does not block estrogen production. It does not "take away a woman's estrogen". It blocks the estrogen receptor in breast cells. Like anastrozole, it can also cause hot flashes. As opposed to anastrozole, tamoxifen has many estrogen-like effects. It is technically an estrogen receptor modulator. This means that in some tissues tamoxifen works like estrogen-- for example it helps strengthen the bones. It tends to improve the cholesterol profile. It can cause thickening of the endometrial lining, and even endometrial polyps or rarely cancer of the uterus.(The  risk of uterine cancer due to tamoxifen is one additional cancer per thousand women year). It can cause vaginal wetness or stickiness. It can cause blood clots through this estrogen-like effect--the risk of blood clots with tamoxifen is exactly the same as with birth control pills or hormone replacement.  Neither of these agents causes mood changes or weight gain, despite the popular belief that they can have these side effects. We have data from studies comparing either of these drugs with placebo, and in those cases the control group had the same amount of weight gain and depression as the group that took the drug.  After all that, we decided we would start with anastrozole. If she tolerates it well we will obtain a bone density and optimize osteoporosis prevention. She will then have the option of switching to tamoxifen after 2 years if she wishes.  We discussed the starting date  and she would like to start December 1. I warned her regarding possible residual side effects from radiation that should not be confused with side effects from the drug.  She will see me again late January. She knows to call for any problems that may develop before that visit.    Chauncey Cruel, MD   05/03/2016 10:55 AM9

## 2016-05-04 ENCOUNTER — Encounter: Payer: Self-pay | Admitting: Oncology

## 2016-05-04 ENCOUNTER — Ambulatory Visit
Admission: RE | Admit: 2016-05-04 | Discharge: 2016-05-04 | Disposition: A | Discharge status: Home / Self Care | Payer: 59 | Source: Ambulatory Visit | Attending: Radiation Oncology | Admitting: Radiation Oncology

## 2016-05-04 DIAGNOSIS — Z510 Encounter for antineoplastic radiation therapy: Secondary | ICD-10-CM | POA: Diagnosis not present

## 2016-05-05 ENCOUNTER — Ambulatory Visit
Admission: RE | Admit: 2016-05-05 | Discharge: 2016-05-05 | Disposition: A | Discharge status: Home / Self Care | Payer: 59 | Source: Ambulatory Visit | Attending: Radiation Oncology | Admitting: Radiation Oncology

## 2016-05-05 DIAGNOSIS — Z510 Encounter for antineoplastic radiation therapy: Secondary | ICD-10-CM | POA: Diagnosis not present

## 2016-05-08 ENCOUNTER — Encounter: Payer: Self-pay | Admitting: Radiation Oncology

## 2016-05-08 ENCOUNTER — Ambulatory Visit
Admission: RE | Admit: 2016-05-08 | Discharge: 2016-05-08 | Disposition: A | Discharge status: Home / Self Care | Payer: 59 | Source: Ambulatory Visit | Attending: Radiation Oncology | Admitting: Radiation Oncology

## 2016-05-08 VITALS — BP 113/78 | HR 74 | Temp 98.6°F | Ht 68.0 in | Wt 165.0 lb

## 2016-05-08 DIAGNOSIS — Z510 Encounter for antineoplastic radiation therapy: Secondary | ICD-10-CM | POA: Diagnosis not present

## 2016-05-08 DIAGNOSIS — Z170 Estrogen receptor positive status [ER+]: Principal | ICD-10-CM

## 2016-05-08 DIAGNOSIS — C50511 Malignant neoplasm of lower-outer quadrant of right female breast: Secondary | ICD-10-CM

## 2016-05-08 NOTE — Progress Notes (Signed)
Michele Alvarez is here for her 14th fraction of radiation to her Right Chest wall. She denies pain. She does have some fatigue, but had a busy weekend. She has some redness, and swelling to her Right Breast. She reports nipple tenderness which has been present since surgery.   BP 113/78   Pulse 74   Temp 98.6 F (37 C)   Ht 5\' 8"  (1.727 m)   Wt 165 lb (74.8 kg)   SpO2 100% Comment: room air  BMI 25.09 kg/m    Wt Readings from Last 3 Encounters:  05/08/16 165 lb (74.8 kg)  05/03/16 166 lb 4.8 oz (75.4 kg)  05/02/16 169 lb 4 oz (76.8 kg)

## 2016-05-08 NOTE — Progress Notes (Signed)
Electron Holiday representative Note  Diagnosis: Right Breast Cancer  1. Breast cancer of lower-outer quadrant of right female breast ([place]) 174.5 C50.511    Outpatient  The patient's CT images from her initial simulation were reviewed to plan her boost treatment to her right breast  lumpectomy cavity.  Measurements were made regarding the size and depth of the surgical bed. The boost to the lumpectomy cavity will be delivered with 12 MeV electrons; 10 Gy in 5 fractions has been prescribed to the 100% isodose line.   An electron Best boy was reviewed and approved.  A custom electron cut-out will be used for her boost field.    -----------------------------------  Eppie Gibson, MD

## 2016-05-08 NOTE — Progress Notes (Signed)
   Weekly Management Note:  Outpatient    ICD-9-CM ICD-10-CM   1. Malignant neoplasm of lower-outer quadrant of right breast of female, estrogen receptor positive (HCC) 174.5 C50.511    V86.0 Z17.0    Current Dose:  28 Gy  Projected Dose: 60 Gy   Narrative:  The patient presents for routine under treatment assessment.  CBCT/MVCT images/Port film x-rays were reviewed.  The chart was checked.  Doing well  Physical Findings:  height is 5\' 8"  (1.727 m) and weight is 165 lb (74.8 kg). Her temperature is 98.6 F (37 C). Her blood pressure is 113/78 and her pulse is 74. Her oxygen saturation is 100%.   Wt Readings from Last 3 Encounters:  05/08/16 165 lb (74.8 kg)  05/03/16 166 lb 4.8 oz (75.4 kg)  05/02/16 169 lb 4 oz (76.8 kg)   NAD, Skin in the treatment fields is erythematous, breast (right) is a little swollen  Impression:  The patient is tolerating radiotherapy.  Plan:  Continue radiotherapy as planned. Patient instructed to continue to apply Radiplex to intact skin in treatment fields.     ________________________________   Eppie Gibson, M.D.

## 2016-05-09 ENCOUNTER — Ambulatory Visit
Admission: RE | Admit: 2016-05-09 | Discharge: 2016-05-09 | Disposition: A | Discharge status: Home / Self Care | Payer: 59 | Source: Ambulatory Visit | Attending: Radiation Oncology | Admitting: Radiation Oncology

## 2016-05-09 DIAGNOSIS — Z510 Encounter for antineoplastic radiation therapy: Secondary | ICD-10-CM | POA: Diagnosis not present

## 2016-05-10 ENCOUNTER — Ambulatory Visit
Admission: RE | Admit: 2016-05-10 | Discharge: 2016-05-10 | Disposition: A | Discharge status: Home / Self Care | Payer: 59 | Source: Ambulatory Visit | Attending: Radiation Oncology | Admitting: Radiation Oncology

## 2016-05-10 ENCOUNTER — Encounter: Payer: Self-pay | Admitting: Radiation Oncology

## 2016-05-10 DIAGNOSIS — Z510 Encounter for antineoplastic radiation therapy: Secondary | ICD-10-CM | POA: Diagnosis not present

## 2016-05-10 NOTE — Progress Notes (Signed)
Financial Counseling--called patient today--spoke with her about bringing me income verification to evaluate for [employer]--said she will try and get this together

## 2016-05-11 ENCOUNTER — Ambulatory Visit
Admission: RE | Admit: 2016-05-11 | Discharge: 2016-05-11 | Disposition: A | Discharge status: Home / Self Care | Payer: 59 | Source: Ambulatory Visit | Attending: Radiation Oncology | Admitting: Radiation Oncology

## 2016-05-11 DIAGNOSIS — Z510 Encounter for antineoplastic radiation therapy: Secondary | ICD-10-CM | POA: Diagnosis not present

## 2016-05-12 ENCOUNTER — Ambulatory Visit
Admission: RE | Admit: 2016-05-12 | Discharge: 2016-05-12 | Disposition: A | Discharge status: Home / Self Care | Payer: 59 | Source: Ambulatory Visit | Attending: Radiation Oncology | Admitting: Radiation Oncology

## 2016-05-12 DIAGNOSIS — Z510 Encounter for antineoplastic radiation therapy: Secondary | ICD-10-CM | POA: Diagnosis not present

## 2016-05-15 ENCOUNTER — Ambulatory Visit
Admission: RE | Admit: 2016-05-15 | Discharge: 2016-05-15 | Disposition: A | Discharge status: Home / Self Care | Payer: 59 | Source: Ambulatory Visit | Attending: Radiation Oncology | Admitting: Radiation Oncology

## 2016-05-15 DIAGNOSIS — C50511 Malignant neoplasm of lower-outer quadrant of right female breast: Secondary | ICD-10-CM

## 2016-05-15 DIAGNOSIS — Z170 Estrogen receptor positive status [ER+]: Principal | ICD-10-CM

## 2016-05-15 DIAGNOSIS — Z510 Encounter for antineoplastic radiation therapy: Secondary | ICD-10-CM | POA: Diagnosis not present

## 2016-05-15 NOTE — Progress Notes (Signed)
   Weekly Management Note:  Outpatient    ICD-9-CM ICD-10-CM   1. Malignant neoplasm of lower-outer quadrant of right breast of female, estrogen receptor positive (HCC) 174.5 C50.511    V86.0 Z17.0    Current Dose:  38 Gy  Projected Dose: 60 Gy   Narrative:  The patient presents for routine under treatment assessment.  CBCT/MVCT images/Port film x-rays were reviewed.  The chart was checked.  Doing well.  Physical Findings:  vitals were not taken for this visit.  Wt Readings from Last 3 Encounters:  05/08/16 165 lb (74.8 kg)  05/03/16 166 lb 4.8 oz (75.4 kg)  05/02/16 169 lb 4 oz (76.8 kg)   NAD, Skin in the treatment fields is erythematous and intact  Impression:  The patient is tolerating radiotherapy.  Plan:  Continue radiotherapy as planned. Patient instructed to continue to apply Radiplex to intact skin in treatment fields. Apply hydrocortisone 1% cream prn itching.     ________________________________   Eppie Gibson, M.D.

## 2016-05-16 ENCOUNTER — Ambulatory Visit
Admission: RE | Admit: 2016-05-16 | Discharge: 2016-05-16 | Disposition: A | Discharge status: Home / Self Care | Payer: 59 | Source: Ambulatory Visit | Attending: Radiation Oncology | Admitting: Radiation Oncology

## 2016-05-16 DIAGNOSIS — Z510 Encounter for antineoplastic radiation therapy: Secondary | ICD-10-CM | POA: Diagnosis not present

## 2016-05-17 ENCOUNTER — Ambulatory Visit
Admission: RE | Admit: 2016-05-17 | Discharge: 2016-05-17 | Disposition: A | Discharge status: Home / Self Care | Payer: 59 | Source: Ambulatory Visit | Attending: Radiation Oncology | Admitting: Radiation Oncology

## 2016-05-17 DIAGNOSIS — Z510 Encounter for antineoplastic radiation therapy: Secondary | ICD-10-CM | POA: Diagnosis not present

## 2016-05-18 ENCOUNTER — Ambulatory Visit
Admission: RE | Admit: 2016-05-18 | Discharge: 2016-05-18 | Disposition: A | Discharge status: Home / Self Care | Payer: 59 | Source: Ambulatory Visit | Attending: Radiation Oncology | Admitting: Radiation Oncology

## 2016-05-18 DIAGNOSIS — Z510 Encounter for antineoplastic radiation therapy: Secondary | ICD-10-CM | POA: Diagnosis not present

## 2016-05-19 ENCOUNTER — Ambulatory Visit
Admission: RE | Admit: 2016-05-19 | Discharge: 2016-05-19 | Disposition: A | Discharge status: Home / Self Care | Payer: 59 | Source: Ambulatory Visit | Attending: Radiation Oncology | Admitting: Radiation Oncology

## 2016-05-19 DIAGNOSIS — Z510 Encounter for antineoplastic radiation therapy: Secondary | ICD-10-CM | POA: Diagnosis not present

## 2016-05-21 ENCOUNTER — Ambulatory Visit: Payer: 59

## 2016-05-22 ENCOUNTER — Ambulatory Visit
Admission: RE | Admit: 2016-05-22 | Discharge: 2016-05-22 | Disposition: A | Discharge status: Home / Self Care | Payer: 59 | Source: Ambulatory Visit | Attending: Radiation Oncology | Admitting: Radiation Oncology

## 2016-05-22 ENCOUNTER — Encounter: Payer: Self-pay | Admitting: Radiation Oncology

## 2016-05-22 VITALS — BP 118/76 | HR 91 | Temp 98.6°F | Ht 68.0 in | Wt 166.2 lb

## 2016-05-22 DIAGNOSIS — C50511 Malignant neoplasm of lower-outer quadrant of right female breast: Secondary | ICD-10-CM

## 2016-05-22 DIAGNOSIS — Z510 Encounter for antineoplastic radiation therapy: Secondary | ICD-10-CM | POA: Diagnosis not present

## 2016-05-22 DIAGNOSIS — Z170 Estrogen receptor positive status [ER+]: Principal | ICD-10-CM

## 2016-05-22 NOTE — Progress Notes (Signed)
Ms. Hector presents for her 24th fraction of radiation to her Right Chest Wall. She denies pain, but does report some tenderness when waking up in the morning. She reports some fatigue in the evenings, but sleeping well at night. Her Right Breast is red and hyperpigmented. She has a small area of dry peeling noted to her Right axilla area, and she will begin using neosporin to this area. She also has an area Right Back shoulder area which is red and hyperpigmented, that itches her frequently. She is using hydrocortisone to this area with some relief. She is using her Radiaplex twice daily.  BP 118/76   Pulse 91   Temp 98.6 F (37 C)   Ht 5\' 8"  (1.727 m)   Wt 166 lb 3.2 oz (75.4 kg)   SpO2 100% Comment: room air  BMI 25.27 kg/m    Wt Readings from Last 3 Encounters:  05/22/16 166 lb 3.2 oz (75.4 kg)  05/08/16 165 lb (74.8 kg)  05/03/16 166 lb 4.8 oz (75.4 kg)

## 2016-05-22 NOTE — Progress Notes (Signed)
   Weekly Management Note:  Outpatient    ICD-9-CM ICD-10-CM   1. Malignant neoplasm of lower-outer quadrant of right breast of female, estrogen receptor positive (HCC) 174.5 C50.511    V86.0 Z17.0    Current Dose:  48 Gy  Projected Dose: 60 Gy   Narrative:  The patient presents for routine under treatment assessment.  CBCT/MVCT images/Port film x-rays were reviewed.  The chart was checked.  Doing well. Skin irritation over upper right back - itchy  Physical Findings:  height is 5\' 8"  (1.727 m) and weight is 166 lb 3.2 oz (75.4 kg). Her temperature is 98.6 F (37 C). Her blood pressure is 118/76 and her pulse is 91. Her oxygen saturation is 100%.   Wt Readings from Last 3 Encounters:  05/22/16 166 lb 3.2 oz (75.4 kg)  05/08/16 165 lb (74.8 kg)  05/03/16 166 lb 4.8 oz (75.4 kg)   NAD, Skin in the treatment fields is erythematous and intact with radiation dermatitis in upper right back  Impression:  The patient is tolerating radiotherapy.  Plan:  Continue radiotherapy as planned. Patient instructed to continue to apply Radiplex to intact skin in treatment fields. Apply hydrocortisone 1% cream prn itching.    ________________________________   Eppie Gibson, M.D.

## 2016-05-23 ENCOUNTER — Ambulatory Visit
Admission: RE | Admit: 2016-05-23 | Discharge: 2016-05-23 | Disposition: A | Discharge status: Home / Self Care | Payer: 59 | Source: Ambulatory Visit | Attending: Radiation Oncology | Admitting: Radiation Oncology

## 2016-05-23 ENCOUNTER — Ambulatory Visit: Payer: 59

## 2016-05-23 DIAGNOSIS — Z510 Encounter for antineoplastic radiation therapy: Secondary | ICD-10-CM | POA: Diagnosis not present

## 2016-05-24 ENCOUNTER — Ambulatory Visit: Payer: 59

## 2016-05-24 ENCOUNTER — Ambulatory Visit
Admission: RE | Admit: 2016-05-24 | Discharge: 2016-05-24 | Disposition: A | Discharge status: Home / Self Care | Payer: 59 | Source: Ambulatory Visit | Attending: Radiation Oncology | Admitting: Radiation Oncology

## 2016-05-24 DIAGNOSIS — Z510 Encounter for antineoplastic radiation therapy: Secondary | ICD-10-CM | POA: Diagnosis not present

## 2016-05-25 ENCOUNTER — Ambulatory Visit
Admission: RE | Admit: 2016-05-25 | Discharge: 2016-05-25 | Disposition: A | Discharge status: Home / Self Care | Payer: 59 | Source: Ambulatory Visit | Attending: Radiation Oncology | Admitting: Radiation Oncology

## 2016-05-25 DIAGNOSIS — Z510 Encounter for antineoplastic radiation therapy: Secondary | ICD-10-CM | POA: Diagnosis not present

## 2016-05-26 ENCOUNTER — Ambulatory Visit
Admission: RE | Admit: 2016-05-26 | Discharge: 2016-05-26 | Disposition: A | Discharge status: Home / Self Care | Payer: 59 | Source: Ambulatory Visit | Attending: Radiation Oncology | Admitting: Radiation Oncology

## 2016-05-26 DIAGNOSIS — Z510 Encounter for antineoplastic radiation therapy: Secondary | ICD-10-CM | POA: Diagnosis not present

## 2016-05-28 ENCOUNTER — Ambulatory Visit: Admission: RE | Admit: 2016-05-28 | Payer: 59 | Source: Ambulatory Visit

## 2016-05-29 ENCOUNTER — Ambulatory Visit
Admission: RE | Admit: 2016-05-29 | Discharge: 2016-05-29 | Disposition: A | Discharge status: Home / Self Care | Payer: 59 | Source: Ambulatory Visit | Attending: Radiation Oncology | Admitting: Radiation Oncology

## 2016-05-29 ENCOUNTER — Encounter: Payer: Self-pay | Admitting: Radiation Oncology

## 2016-05-29 ENCOUNTER — Ambulatory Visit: Payer: 59

## 2016-05-29 VITALS — BP 104/78 | HR 82 | Temp 98.0°F | Ht 68.0 in | Wt 164.0 lb

## 2016-05-29 DIAGNOSIS — C50511 Malignant neoplasm of lower-outer quadrant of right female breast: Secondary | ICD-10-CM | POA: Insufficient documentation

## 2016-05-29 DIAGNOSIS — Z510 Encounter for antineoplastic radiation therapy: Secondary | ICD-10-CM | POA: Diagnosis not present

## 2016-05-29 DIAGNOSIS — Z170 Estrogen receptor positive status [ER+]: Secondary | ICD-10-CM

## 2016-05-29 DIAGNOSIS — Z923 Personal history of irradiation: Secondary | ICD-10-CM | POA: Diagnosis not present

## 2016-05-29 MED ORDER — RADIAPLEXRX EX GEL
Freq: Once | CUTANEOUS | Status: AC
  Administered 2016-05-29: 17:00:00 via TOPICAL

## 2016-05-29 NOTE — Addendum Note (Signed)
Encounter addended by: Ernst Spell, RN on: 05/29/2016  5:07 PM<BR>    Actions taken: MAR administration accepted

## 2016-05-29 NOTE — Progress Notes (Signed)
   Weekly Management Note:  Outpatient    ICD-9-CM ICD-10-CM   1. Malignant neoplasm of lower-outer quadrant of right breast of female, estrogen receptor positive (HCC) 174.5 C50.511 hyaluronate sodium (RADIAPLEXRX) gel   V86.0 Z17.0    Current Dose:  58 Gy  Projected Dose: 60 Gy   Narrative:  The patient presents for routine under treatment assessment.  CBCT/MVCT images/Port film x-rays were reviewed.  The chart was checked.  Doing well.   Physical Findings:  height is 5\' 8"  (1.727 m) and weight is 164 lb (74.4 kg). Her temperature is 98 F (36.7 C). Her blood pressure is 104/78 and her pulse is 82. Her oxygen saturation is 100%.   Wt Readings from Last 3 Encounters:  05/29/16 164 lb (74.4 kg)  05/22/16 166 lb 3.2 oz (75.4 kg)  05/08/16 165 lb (74.8 kg)   NAD, Skin in the treatment fields is erythematous and intact with radiation dermatitis in upper right back, and she has superficial peeling in IM fold and axilla on right  Impression:  The patient is tolerating radiotherapy.  Plan:  Continue radiotherapy as planned. Patient instructed to continue to apply Radiplex to intact skin in treatment fields. Apply hydrocortisone 1% cream prn itching. Apply neosporin to peeling areas. F/u 77mo    ________________________________   Eppie Gibson, M.D.

## 2016-05-29 NOTE — Progress Notes (Signed)
Ms. Romito presents for her 29th fraction of radiation to her Right Chest wall. She denies pain, except soreness with certain positions at times. She does have fatigue, and will go to bed early at night. Her Right Breast is red and hyperpigmented. She has redness and hyperpigmentation to her upper right posterior back. There is peeling noted to this area also. She also has peeling to her Right Axilla and underneath her Right Breast. She is using hydrocortisone and Radiaplex to these areas and plans to start using neosporin today. She was provided with a tube of Radiaplex today.  BP 104/78   Pulse 82   Temp 98 F (36.7 C)   Ht 5\' 8"  (1.727 m)   Wt 164 lb (74.4 kg)   SpO2 100% Comment: room air  BMI 24.94 kg/m    Wt Readings from Last 3 Encounters:  05/29/16 164 lb (74.4 kg)  05/22/16 166 lb 3.2 oz (75.4 kg)  05/08/16 165 lb (74.8 kg)

## 2016-05-30 ENCOUNTER — Encounter: Payer: Self-pay | Admitting: Radiation Oncology

## 2016-05-30 ENCOUNTER — Encounter: Payer: Self-pay | Admitting: *Deleted

## 2016-05-30 ENCOUNTER — Ambulatory Visit: Payer: 59

## 2016-05-30 ENCOUNTER — Ambulatory Visit
Admission: RE | Admit: 2016-05-30 | Discharge: 2016-05-30 | Disposition: A | Discharge status: Home / Self Care | Payer: 59 | Source: Ambulatory Visit | Attending: Radiation Oncology | Admitting: Radiation Oncology

## 2016-05-30 DIAGNOSIS — Z510 Encounter for antineoplastic radiation therapy: Secondary | ICD-10-CM | POA: Diagnosis not present

## 2016-06-14 ENCOUNTER — Encounter: Payer: Self-pay | Admitting: Radiation Oncology

## 2016-06-27 ENCOUNTER — Encounter: Payer: Self-pay | Admitting: Radiation Oncology

## 2016-06-30 ENCOUNTER — Ambulatory Visit
Admission: RE | Admit: 2016-06-30 | Discharge: 2016-06-30 | Disposition: A | Discharge status: Home / Self Care | Payer: 59 | Source: Ambulatory Visit | Attending: Radiation Oncology | Admitting: Radiation Oncology

## 2016-06-30 ENCOUNTER — Encounter: Payer: Self-pay | Admitting: Radiation Oncology

## 2016-06-30 DIAGNOSIS — Z170 Estrogen receptor positive status [ER+]: Secondary | ICD-10-CM | POA: Insufficient documentation

## 2016-06-30 DIAGNOSIS — Z5189 Encounter for other specified aftercare: Secondary | ICD-10-CM | POA: Insufficient documentation

## 2016-06-30 DIAGNOSIS — Z923 Personal history of irradiation: Secondary | ICD-10-CM | POA: Diagnosis not present

## 2016-06-30 DIAGNOSIS — C50511 Malignant neoplasm of lower-outer quadrant of right female breast: Secondary | ICD-10-CM | POA: Insufficient documentation

## 2016-06-30 HISTORY — DX: Personal history of irradiation: Z92.3

## 2016-06-30 NOTE — Progress Notes (Signed)
Michele Alvarez presents for follow up of radiation to her Right Breast. She denies pain. She does report an occasional twinge of pain to her Right Breast, but it has improved over the past few weeks. Her skin has healed well per her report. She did peel after radiation finsihed, but has healed. She is not currently using Vitamin E cream. She has questions regarding a sinus infection she has had for several weeks. She questions if her radiation compromised her upper lungs and caused her sinus infection to act differently.   BP 111/77   Pulse 85   Temp 98.5 F (36.9 C)   Ht 5\' 8"  (1.727 m)   Wt 164 lb 12.8 oz (74.8 kg)   SpO2 97% Comment: room air  BMI 25.06 kg/m    Wt Readings from Last 3 Encounters:  06/30/16 164 lb 12.8 oz (74.8 kg)  05/29/16 164 lb (74.4 kg)  05/22/16 166 lb 3.2 oz (75.4 kg)

## 2016-06-30 NOTE — Progress Notes (Signed)
  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 Malignant neoplasm of lower-outer quadrant of right breast of female, estrogen receptor positive (HCC) 174.5 C50.511 hyaluronate sodium (RADIAPLEXRX) gel   V86.0 Z17.0     Indication for treatment:  Curative       Radiation treatment dates:   04/18/2016 to 05/30/2016  Site/dose:    1. The Right breast (4-field with regional nodes) was treated to 50 Gy in 25 fractions at 2 Gy per fraction. 2. The Right breast was boosted to 10 Gy in 5 fractions at 2 Gy per fraction.   Beams/energy:    1. 3D // 10X, 6X 2. En face // 12 MeV  Narrative: The patient tolerated radiation treatment relatively well. The patient developed erythema in the treatment field and radiation dermatitis in the upper right back. She has superficial peeling in the inframammary fold and axilla on the right.  Plan: The patient has completed radiation treatment. The patient will return to radiation oncology clinic for routine followup in one month. I advised them to call or return sooner if they have any questions or concerns related to their recovery or treatment.  -----------------------------------  Eppie Gibson, MD   This document serves as a record of services personally performed by Eppie Gibson, MD. It was created on her behalf by Arlyce Harman, a trained medical scribe. The creation of this record is based on the scribe's personal observations and the provider's statements to them. This document has been checked and approved by the attending provider.

## 2016-06-30 NOTE — Progress Notes (Signed)
Radiation Oncology         (336) [phone] ________________________________  Name: Michele Alvarez MRN: [id number]  Date: 06/30/2016  DOB: June 21, 1961  Follow-Up Visit Note  Outpatient  CC: Lilian Coma, MD  [name], MD  Diagnosis and Prior Radiotherapy: Stage II right breast invasive ductal carcinoma with DCIS     ICD-9-CM ICD-10-CM   1. Malignant neoplasm of lower-outer quadrant of right breast of female, estrogen receptor positive ([place]) 174.5 C50.511    V86.0 Z17.0     Radiation treatment dates:   04/18/16 - 05/30/16 Site/dose:  Total dose 60 Gy in 30 fx  CHIEF COMPLAINT: Here for follow-up and surveillance of Right breast cancer  Narrative:  The patient returns today for routine follow-up of radiation to the Right breast. She denies pain. She does report an occasional twinge of pain to her Right breast, but it has improved over the past few weeks. Her skin has healed well per her report. She did peel after radiation finished, but has healed. She is not currently using Vitamin E cream. She plans to begin using Vitamin E cream. She plans to begin endocrine therapy the beginning of the year.  She has questions regarding a sinus infection she has had for several weeks. She questions if her radiation compromised her upper lungs and caused her sinus infection to act differently. She took augmentin. She completed a Z-pak on Tuesday. States she gets a sinus infection yearly but this year it has lingered longer than usual and includes congestion in the upper chest. She does admit the week before the cold started she was very busy with work. She denies teeth ache.                               ALLERGIES:  is allergic to latex and skin adhesives.  Meds: Current Outpatient Prescriptions  Medication Sig Dispense Refill  . non-metallic deodorant (ALRA) MISC Apply 1 application topically daily as needed.     No current facility-administered medications for this encounter.       Physical Findings:  height is 5\' 8"  (1.727 m) and weight is 164 lb 12.8 oz (74.8 kg). Her temperature is 98.5 F (36.9 C). Her blood pressure is 111/77 and her pulse is 85. Her oxygen saturation is 97%. .    General: Alert and oriented, in no acute distress HEENT: Head is normocephalic. Neurologic: Speech is fluent. Coordination is intact. Psychiatric: Judgment and insight are intact. Affect is appropriate. Breast: Skin of the right breast still slightly erythematous. Dry skin at the right nipple.  Skin: Erythema over the right upper back.   Lab Findings: Lab Results  Component Value Date   WBC 3.1 (L) 05/03/2016   HGB 13.3 05/03/2016   HCT 39.7 05/03/2016   MCV 94.1 05/03/2016   PLT 201 05/03/2016    Radiographic Findings: No results found.  Impression/Plan:  Breast cancer, Recovering well from radiation treatment.   I encouraged her to continue with yearly mammography and followup with medical oncology. I will see her back on an as-needed basis. I have encouraged her to call if she has any issues or concerns in the future. I wished her the very best. Apply Vit E lotion to skin for more healing.   Advised her to follow with her PCP re: symptoms of URI.  This is not related to radiotherapy. We did discuss symptoms of rare pneumonitis (dry rather than productive cough, SOB)  which is not consistent with her current symptoms. This rarely develops several weeks post RT. She will let me know if this develops.    Eppie Gibson, MD   This document serves as a record of services personally performed by Eppie Gibson, MD. It was created on her behalf by Arlyce Harman, a trained medical scribe. The creation of this record is based on the scribe's personal observations and the provider's statements to them. This document has been checked and approved by the attending provider.

## 2016-07-03 ENCOUNTER — Other Ambulatory Visit: Payer: Self-pay | Admitting: *Deleted

## 2016-07-03 DIAGNOSIS — C50511 Malignant neoplasm of lower-outer quadrant of right female breast: Secondary | ICD-10-CM

## 2016-07-03 DIAGNOSIS — Z170 Estrogen receptor positive status [ER+]: Principal | ICD-10-CM

## 2016-07-03 MED ORDER — ANASTROZOLE 1 MG PO TABS: 1.0000 mg | ORAL_TABLET | Freq: Every day | ORAL | 3 refills | Status: DC

## 2016-08-01 ENCOUNTER — Encounter: Payer: Self-pay | Admitting: Physical Therapy

## 2016-08-01 ENCOUNTER — Ambulatory Visit: Payer: 59 | Attending: Sports Medicine | Admitting: Physical Therapy

## 2016-08-01 DIAGNOSIS — G8929 Other chronic pain: Secondary | ICD-10-CM | POA: Diagnosis present

## 2016-08-01 DIAGNOSIS — M25562 Pain in left knee: Secondary | ICD-10-CM | POA: Diagnosis present

## 2016-08-01 DIAGNOSIS — M6281 Muscle weakness (generalized): Secondary | ICD-10-CM

## 2016-08-01 DIAGNOSIS — R2689 Other abnormalities of gait and mobility: Secondary | ICD-10-CM | POA: Diagnosis present

## 2016-08-01 NOTE — Therapy (Signed)
[facility] [address], [place], [place], [address] Phone: [phone]   Fax:  [phone]  Physical Therapy Evaluation  Patient Details  Name: Michele Alvarez MRN: [id number] Date of Birth: 08-02-1960 Referring Provider: Dr. [name]  Encounter Date: 08/01/2016      PT End of Session - 08/01/16 1227    Visit Number 2   Date for PT Re-Evaluation 09/26/16   PT Start Time 1145   PT Stop Time 1225   PT Time Calculation (min) 40 min   Activity Tolerance Patient tolerated treatment well   Behavior During Therapy [facility] for tasks assessed/performed      Past Medical History:  Diagnosis Date  . Breast cancer of lower-outer quadrant of right female breast ([place]) 08/19/2015  . History of radiation therapy 04/18/16- 05/30/16   Right Breast 50 Gy in 25 fractions. then boosted to 60 Gy in 5 fractions  . Hot flashes   . PONV (postoperative nausea and vomiting)     Past Surgical History:  Procedure Laterality Date  . BACK SURGERY  2007, 2014   x2, lumbar surgery used same incision for both surgeries.   Marland Kitchen BREAST LUMPECTOMY WITH RADIOACTIVE SEED AND AXILLARY LYMPH NODE DISSECTION Right 03/08/2016   Procedure: RIGHT BREAST BRACKETED RADIOACTIVE SEED GUIDED LUMPECTOMY RIGHT AXILLARY SENTINEL LYMPH NODE BIOPSY AND RIGHT RADIOACTIVE SEED TARGETED AXILLARY NODE DISSECTION;  Surgeon: [name], MD;  Location: [place];  Service: General;  Laterality: Right;  . NECK SURGERY  2001  . PORT-A-CATH REMOVAL Right 03/08/2016   Procedure: REMOVAL PORT-A-CATH;  Surgeon: [name], MD;  Location: [place];  Service: General;  Laterality: Right;  . PORTACATH PLACEMENT Right 09/09/2015   Procedure: INSERTION PORT-A-CATH WITH [place] ;  Surgeon: [name], MD;  Location: [place];  Service: General;  Laterality: Right;    There were no vitals filed for this visit.        Subjective Assessment - 08/01/16 1153    Subjective Patient reports left knee pain has been chronic.  In the last 6 months feels like her left knee is giving on the left side.  She has sharp pain on medial left knee.  Patient reports swelling in left knee.    Pertinent History Right breast cancer diagnosed in February 2017 and had neo-adjuvant chemo from March through July.  Lumpectomy with SLNB 03/08/16 and will have XRT; sees rad onc this Friday.  h/o two back surgeries, both L5-S1 microdiscectomies in 2005 and 2014 or 2015, with poor results.  Still has symptoms, including radiculopathy in right leg.  She reports numbness in her right heel and that it feels like she walks with socks wadded up in her shoe; it gets hard and burns and tingles.  Had PT and it just kept getting worse so she just lives with it.  The foot feels harder the more she walks and her walking distance is limited.  Has a "weird sensation" in right foot, difficult to describe.   Patient Stated Goals Strengthening knees   Currently in Pain? Yes   Pain Score 10-Worst pain ever   Pain Location Knee   Pain Orientation Left;Medial   Pain Descriptors / Indicators Sharp   Pain Type Chronic pain   Pain Onset More than a month ago   Pain Frequency Intermittent   Aggravating Factors  walk to turn a corner; going up and down stairs   Pain Relieving Factors wearing brace  Multiple Pain Sites No            [facility] PT Assessment - 08/01/16 0001      Assessment   Medical Diagnosis left chondromalcia patella   Referring Provider Dr. [name]   Onset Date/Surgical Date 04/16/16   Prior Therapy None     Precautions   Precautions Other (comment)   Precaution Comments cancer precautions     Restrictions   Weight Bearing Restrictions No     Balance Screen   Has the patient fallen in the past 6 months No   Has the patient had a decrease in activity level because of a fear of falling?  No   Is the patient reluctant to leave their  home because of a fear of falling?  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     Cognition   Overall Cognitive Status Within [place] for tasks assessed     Observation/Other Assessments   Focus on Therapeutic Outcomes (FOTO)  57% limitation  goal is 39% limitation     Posture/Postural Control   Posture/Postural Control No significant limitations     ROM / Strength   AROM / PROM / Strength AROM;Strength     AROM   Overall AROM Comments left knee ROM is full   AROM Assessment Site Knee  left is full     Strength   Strength Assessment Site Knee   Left Hip Flexion 4/5   Left Hip Extension 4/5   Left Hip External Rotation 4/5   Left Hip ABduction 3/5   Right/Left Knee Left   Left Knee Flexion 4/5   Left Knee Extension 4/5     Palpation   Palpation comment tenderness in medial and lateral joint     Special Tests    Special Tests Knee Special Tests   Knee Special tests  Patellofemoral Grind Test (Clarke's Sign)     Patellofemoral Grind test (Clark's Sign)   Findings Postive   Side  Left   Comments crepetus     Ambulation/Gait   Ambulation/Gait Yes   Stairs Yes   Stair Management Technique Backwards                           PT Education - 08/01/16 1225    Education provided Yes   Education Details stretches hips, straight leg raise   Person(s) Educated Patient   Methods Explanation;Handout   Comprehension Verbalized understanding;Returned demonstration          PT Short Term Goals - 08/01/16 1241      PT SHORT TERM GOAL #1   Title independent with initial HEP   Time 4   Period Weeks   Status New     PT SHORT TERM GOAL #2   Title walk around a corner with pain in left knee </= 25%   Time 4   Period Weeks    Status New     PT SHORT TERM GOAL #3   Title walking with pain decreased >/= 25%   Time 4   Period Weeks   Status New           PT Long Term Goals - 08/01/16 1242  PT LONG TERM GOAL #1   Title independent with HEP   Time 8   Period Weeks   Status New     PT LONG TERM GOAL #2   Title go up and down stairs forward with step over step pattern and pain decreased >/= 75%   Time 8   Period Weeks   Status New     PT LONG TERM GOAL #3   Title walk with pain decreased >/= 75% due to strength of left knee and hip >/= 4+/5   Time 8   Period Weeks   Status New     PT LONG TERM GOAL #4   Title FOTO score </= 39% limitation   Time 8   Period Weeks   Status New           Long Term Clinic Goals - 03/29/16 0909      CC Long Term Goal  #1   Title Patient will be aware of lymphedema risk reduction practices and knowledgeable about where and how to obtain a compression sleeve for prophylaxis.   Status Achieved            Plan - 08/01/16 1228    Clinical Impression Statement Patient is a 56 year old female with diagnosis of left patella condromalcia that started several months ago. Patient reports her pain is intermittent 10/10 pain that is sharp in the left knee.  Patient will go down steps backward due to pain.  Patient has increased pain with standing and when she is walking around a corner. Patient left hip and knee is weak.  Patient is low complexity evaluation due to evolving condition and 1 comorbidity that includes right leg weakness from spinal surgery that will impact care provided.    Rehab Potential Excellent   Clinical Impairments Affecting Rehab Potential S/P breat cancer-no ultrasound   PT Frequency 2x / week   PT Duration 8 weeks   PT Treatment/Interventions Therapeutic activities;Therapeutic exercise;Neuromuscular re-education;Patient/family education;Iontophoresis 4mg /ml Dexamethasone;Gait training;Manual techniques;Dry needling   PT Next Visit Plan  left hip and knee strength, gastroc stretch, patella mobilization; ionto if MD signs the order   Recommended Other Services None      Patient will benefit from skilled therapeutic intervention in order to improve the following deficits and impairments:  Pain, Decreased strength  Visit Diagnosis: Muscle weakness (generalized) - Plan: PT plan of care cert/re-cert  Chronic pain of left knee - Plan: PT plan of care cert/re-cert  Other abnormalities of gait and mobility - Plan: PT plan of care cert/re-cert     Problem List Patient Active Problem List   Diagnosis Date Noted  . Breast cancer of lower-outer quadrant of right female breast ([place]) 08/19/2015   Earlie Counts, PT 08/01/2016, 12:46 PM  [facility] [address], [place], [place], [address] Phone: [phone]   Fax:  [phone]  Name: JANEISHA BREECE MRN: [id number] Date of Birth: Dec 16, 1960

## 2016-08-01 NOTE — Addendum Note (Signed)
Addended by: Earlie Counts F on: 08/01/2016 12:51 PM   Modules accepted: Orders

## 2016-08-01 NOTE — Patient Instructions (Addendum)
Hamstring Stretch (Sitting)    Sitting, extend one leg and place hands on same thigh for support. Keeping torso straight, lean forward, sliding hands down leg, until a stretch is felt in back of thigh. Hold __30__ seconds. Repeat with other leg.  Copyright  VHI. All rights reserved.  Chair Sitting    Sit at edge of seat, spine straight, one leg extended. Put a hand on each thigh and bend forward from the hip, keeping spine straight. Allow hand on extended leg to reach toward toes. Support upper body with other arm. Hold 30___ seconds. Repeat _2__ times per session. Do _1__ sessions per day.  Copyright  VHI. All rights reserved.  Hip Abductor With Hip Flexed 90 Degrees    Wrap strap around left leg, inside calf, outside thigh, hold in opposite-side hand. With leg at maximal straight leg raise, pull leg over to other side. Move free arm out at 90, turn head toward free arm. Hold 30___ seconds. Relax leg. Repeat _1__ times.  Copyright  VHI. All rights reserved.  Hip Adductor With Hip Flexed 90 Degrees    Wrap strap around left leg, outside calf, inside thigh, hold in same-side hand. With leg in maximal straight leg raise, pull leg out to side. Control leg movement with arm. Keep opposite leg still. Do not rotate pelvis or trunk. Hold _30__ seconds. Relax leg. If control of leg movement is difficult, wrap strap inside calf, outside thigh. Repeat _2__ times.  Copyright  VHI. All rights reserved.  Piriformis Stretch, Sitting    Sit, one ankle on opposite knee, same-side hand on crossed knee. Push down on knee, keeping spine straight. Lean torso forward, with flat back, until tension is felt in hamstrings and gluteals of crossed-leg side. Hold _30__ seconds.  Repeat _2__ times per session. Do _1__ sessions per day.  Copyright  VHI. All rights reserved.  Quads / HF, Side-Lying    Lie on one side, legs bent. Hold foot of top leg with same-side hand. Raise leg. Hold _30__  seconds.  Repeat _2__ times per session. Do _1__ sessions per day.  Copyright  VHI. All rights reserved.  Straight Leg Raise    Tighten stomach and slowly raise locked right leg _6___ inches from floor. Repeat _2x15___ times per set. Do __1__ sets per session. Do __1__ sessions per day.  http://orth.exer.us/1103   Copyright  VHI. All rights reserved.  [place] [facility] [address] [place], [place] [address] Phone # [phone] Fax [phone]

## 2016-08-09 ENCOUNTER — Other Ambulatory Visit: Payer: Self-pay | Admitting: Emergency Medicine

## 2016-08-09 DIAGNOSIS — Z170 Estrogen receptor positive status [ER+]: Principal | ICD-10-CM

## 2016-08-09 DIAGNOSIS — C50511 Malignant neoplasm of lower-outer quadrant of right female breast: Secondary | ICD-10-CM

## 2016-08-10 ENCOUNTER — Ambulatory Visit: Payer: 59 | Admitting: Physical Therapy

## 2016-08-10 ENCOUNTER — Other Ambulatory Visit (HOSPITAL_BASED_OUTPATIENT_CLINIC_OR_DEPARTMENT_OTHER): Payer: 59

## 2016-08-10 ENCOUNTER — Ambulatory Visit (HOSPITAL_BASED_OUTPATIENT_CLINIC_OR_DEPARTMENT_OTHER): Payer: 59 | Admitting: Oncology

## 2016-08-10 ENCOUNTER — Encounter: Payer: Self-pay | Admitting: Physical Therapy

## 2016-08-10 VITALS — BP 130/68 | HR 84 | Temp 97.8°F | Resp 18 | Wt 166.1 lb

## 2016-08-10 DIAGNOSIS — M6281 Muscle weakness (generalized): Secondary | ICD-10-CM

## 2016-08-10 DIAGNOSIS — Z170 Estrogen receptor positive status [ER+]: Secondary | ICD-10-CM

## 2016-08-10 DIAGNOSIS — C50511 Malignant neoplasm of lower-outer quadrant of right female breast: Secondary | ICD-10-CM

## 2016-08-10 DIAGNOSIS — G8929 Other chronic pain: Secondary | ICD-10-CM

## 2016-08-10 DIAGNOSIS — M8580 Other specified disorders of bone density and structure, unspecified site: Secondary | ICD-10-CM

## 2016-08-10 DIAGNOSIS — M25562 Pain in left knee: Secondary | ICD-10-CM

## 2016-08-10 DIAGNOSIS — R2689 Other abnormalities of gait and mobility: Secondary | ICD-10-CM

## 2016-08-10 LAB — CBC WITH DIFFERENTIAL/PLATELET
BASO%: 0.3 % (ref 0.0–2.0)
Basophils Absolute: 0 10*3/uL (ref 0.0–0.1)
EOS ABS: 0.1 10*3/uL (ref 0.0–0.5)
EOS%: 1.5 % (ref 0.0–7.0)
HEMATOCRIT: 38.3 % (ref 34.8–46.6)
HEMOGLOBIN: 12.8 g/dL (ref 11.6–15.9)
LYMPH#: 1.1 10*3/uL (ref 0.9–3.3)
LYMPH%: 26.9 % (ref 14.0–49.7)
MCH: 31.4 pg (ref 25.1–34.0)
MCHC: 33.4 g/dL (ref 31.5–36.0)
MCV: 93.9 fL (ref 79.5–101.0)
MONO#: 0.3 10*3/uL (ref 0.1–0.9)
MONO%: 7.2 % (ref 0.0–14.0)
NEUT%: 64.1 % (ref 38.4–76.8)
NEUTROS ABS: 2.5 10*3/uL (ref 1.5–6.5)
PLATELETS: 196 10*3/uL (ref 145–400)
RBC: 4.08 10*6/uL (ref 3.70–5.45)
RDW: 12.9 % (ref 11.2–14.5)
WBC: 3.9 10*3/uL (ref 3.9–10.3)

## 2016-08-10 LAB — COMPREHENSIVE METABOLIC PANEL
ALBUMIN: 4.3 g/dL (ref 3.5–5.0)
ALK PHOS: 96 U/L (ref 40–150)
ALT: 21 U/L (ref 0–55)
ANION GAP: 10 meq/L (ref 3–11)
AST: 23 U/L (ref 5–34)
BILIRUBIN TOTAL: 0.91 mg/dL (ref 0.20–1.20)
BUN: 15.6 mg/dL (ref 7.0–26.0)
CALCIUM: 9.8 mg/dL (ref 8.4–10.4)
CO2: 27 meq/L (ref 22–29)
CREATININE: 0.8 mg/dL (ref 0.6–1.1)
Chloride: 107 mEq/L (ref 98–109)
EGFR: 86 mL/min/{1.73_m2} — AB (ref >90)
Glucose: 104 mg/dl (ref 70–140)
Potassium: 4.1 mEq/L (ref 3.5–5.1)
Sodium: 144 mEq/L (ref 136–145)
TOTAL PROTEIN: 6.7 g/dL (ref 6.4–8.3)

## 2016-08-10 NOTE — Therapy (Signed)
[facility] [address] [place], [place], [address] Phone: [phone]   Fax:  [phone]  Physical Therapy Treatment  Patient Details  Name: Michele Alvarez MRN: [id number] Date of Birth: August 06, 1960 Referring Provider: Dr. [name]  Encounter Date: 08/10/2016      PT End of Session - 08/10/16 1611    Visit Number 3   Date for PT Re-Evaluation 09/26/16   PT Start Time [id number]   PT Stop Time 1657   PT Time Calculation (min) 48 min   Activity Tolerance Patient tolerated treatment well   Behavior During Therapy [facility] for tasks assessed/performed      Past Medical History:  Diagnosis Date  . Breast cancer of lower-outer quadrant of right female breast ([place]) 08/19/2015  . History of radiation therapy 04/18/16- 05/30/16   Right Breast 50 Gy in 25 fractions. then boosted to 60 Gy in 5 fractions  . Hot flashes   . PONV (postoperative nausea and vomiting)     Past Surgical History:  Procedure Laterality Date  . BACK SURGERY  2007, 2014   x2, lumbar surgery used same incision for both surgeries.   Marland Kitchen BREAST LUMPECTOMY WITH RADIOACTIVE SEED AND AXILLARY LYMPH NODE DISSECTION Right 03/08/2016   Procedure: RIGHT BREAST BRACKETED RADIOACTIVE SEED GUIDED LUMPECTOMY RIGHT AXILLARY SENTINEL LYMPH NODE BIOPSY AND RIGHT RADIOACTIVE SEED TARGETED AXILLARY NODE DISSECTION;  Surgeon: [name], MD;  Location: [place];  Service: General;  Laterality: Right;  . NECK SURGERY  2001  . PORT-A-CATH REMOVAL Right 03/08/2016   Procedure: REMOVAL PORT-A-CATH;  Surgeon: [name], MD;  Location: [place];  Service: General;  Laterality: Right;  . PORTACATH PLACEMENT Right 09/09/2015   Procedure: INSERTION PORT-A-CATH WITH [place] ;  Surgeon: [name], MD;  Location: [place];  Service: General;  Laterality: Right;    There were no vitals filed for this visit.       Subjective Assessment - 08/10/16 1610    Subjective Pt reports knee not doing too bad today. has not been wearing brace due to decreased knee pain.    Pertinent History Right breast cancer diagnosed in February 2017 and had neo-adjuvant chemo from March through July.  Lumpectomy with SLNB 03/08/16 and will have XRT; sees rad onc this Friday.  h/o two back surgeries, both L5-S1 microdiscectomies in 2005 and 2014 or 2015, with poor results.  Still has symptoms, including radiculopathy in right leg.  She reports numbness in her right heel and that it feels like she walks with socks wadded up in her shoe; it gets hard and burns and tingles.  Had PT and it just kept getting worse so she just lives with it.  The foot feels harder the more she walks and her walking distance is limited.  Has a "weird sensation" in right foot, difficult to describe.   Patient Stated Goals Strengthening knees   Currently in Pain? Yes   Pain Score 1    Pain Location Knee   Pain Orientation Left;Medial   Pain Descriptors / Indicators Sharp   Pain Type Chronic pain   Pain Onset More than a month ago   Pain Frequency Intermittent                         [facility] Adult PT Treatment/Exercise - 08/10/16 0001      Exercises   Exercises Knee/Hip     Knee/Hip Exercises: Aerobic  Nustep L1 x 6 minutes  Therapist present to discuss treatment     Knee/Hip Exercises: Seated   Long Arc Quad Strengthening;Both;2 sets;10 reps  #3     Knee/Hip Exercises: Supine   Quad Sets Strengthening;Left;10 reps  x20, increased glute activation   Short Arc [employer] Strengthening;Both;2 sets;20 reps  #5   Straight Leg Raises Strengthening;Both   Other Supine Knee/Hip Exercises Ball squeeze/ Band abduction  with diapharamic breathing   Other Supine Knee/Hip Exercises Knees to chest  Red ball     Knee/Hip Exercises: Prone   Hamstring Curl --     Manual Therapy   Manual Therapy Taping   Manual therapy comments  Facilitative taping to Lt quad and supportive to lateral patella                PT Education - 08/10/16 1656    Education provided Yes   Education Details LE strengthening   Person(s) Educated Patient   Methods Explanation;Demonstration;Handout   Comprehension Verbalized understanding          PT Short Term Goals - 08/10/16 1612      PT SHORT TERM GOAL #1   Title independent with initial HEP   Time 4   Period Weeks   Status On-going     PT SHORT TERM GOAL #2   Title walk around a corner with pain in left knee </= 25%   Time 4   Period Weeks   Status On-going     PT SHORT TERM GOAL #3   Title walking with pain decreased >/= 25%   Time 4   Period Weeks   Status On-going           PT Long Term Goals - 08/10/16 1612      PT LONG TERM GOAL #1   Title independent with HEP   Time 8   Period Weeks   Status On-going     PT LONG TERM GOAL #2   Title go up and down stairs forward with step over step pattern and pain decreased >/= 75%   Time 8   Period Weeks   Status On-going     PT LONG TERM GOAL #3   Title walk with pain decreased >/= 75% due to strength of left knee and hip >/= 4+/5   Time 8   Period Weeks   Status On-going     PT LONG TERM GOAL #4   Title FOTO score </= 39% limitation   Time 8   Period Weeks   Status On-going               Plan - 08/10/16 1656    Clinical Impression Statement Pt presents with Rt lateral knee pain and LE weakness. Pt had decrease quad contraction on Rt with compensation pattern with increased glute activation. Pt able to tolerate all strengthening exercsies well. Pt reports stretching has helped with pain reduction. Pt will continue to benefit from skilled therapy for LE strength and knee stability.    Rehab Potential Excellent   Clinical Impairments Affecting Rehab Potential S/P breat cancer-no ultrasound   PT Frequency 2x / week   PT Duration 8 weeks   PT Treatment/Interventions Therapeutic  activities;Therapeutic exercise;Neuromuscular re-education;Patient/family education;Iontophoresis 4mg /ml Dexamethasone;Gait training;Manual techniques;Dry needling   PT Next Visit Plan LE strength and stability and flexibility, manual techniques and modalities as needed      Patient will benefit from skilled therapeutic intervention in order to improve the following deficits and impairments:  Pain, Decreased strength  Visit Diagnosis: Muscle weakness (generalized)  Chronic pain of left knee  Other abnormalities of gait and mobility     Problem List Patient Active Problem List   Diagnosis Date Noted  . Malignant neoplasm of lower-outer quadrant of right breast of female, estrogen receptor positive ([place]) 08/19/2015    Mikle Bosworth PTA 08/10/2016, 5:02 PM  [facility] [address], [place], [place], [address] Phone: [phone]   Fax:  [phone]  Name: DOROTHE MITTEN MRN: [id number] Date of Birth: 04-Mar-1961

## 2016-08-10 NOTE — Patient Instructions (Signed)
Quad Sets    Squeeze pelvic floor and hold. Tighten top of left thigh. Hold for _3__ seconds. Relax for _3__ seconds. Repeat _20__ times. Do _1__ times a day. Repeat with other leg.    Copyright  VHI. All rights reserved.  ABDUCTION: Sitting - Resistance Band (Active)    Sit with feet flat. Lift right leg slightly and, against yellow resistance band, draw it out to side. Complete _20__ sets of _2__ repetitions. Perform __1_ sessions per day.  Copyright  VHI. All rights reserved.   Mikle Bosworth, PTA 08/10/16 4:47 PM  [facility] [address], [place], [place] [address] Phone # [phone] Fax [phone]

## 2016-08-10 NOTE — Progress Notes (Signed)
[place]  Telephone:(336) [phone] Fax:([phone]   ID: Janyia Guion Scovill DOB: September 05, 1960  MR#: [id number]  EXB#:[id number]  Patient Care Team: Jonathon Jordan, MD as PCP - General (Family Medicine) Avon Gully, NP as Nurse Practitioner (Obstetrics and Gynecology) [name], MD as Consulting Physician (General Surgery) Chauncey Cruel, MD as Consulting Physician (Oncology) Eppie Gibson, MD as Attending Physician (Radiation Oncology) Sylvan Cheese, NP as Nurse Practitioner (Hematology and Oncology) Jovita Gamma, MD as Consulting Physician (Neurosurgery) Benson Norway, RN as Registered Nurse PCP: Lilian Coma, MD OTHER MD:  CHIEF COMPLAINT: Node-positive breast cancer  CURRENT TREATMENT: Anastrozole  BREAST CANCER HISTORY: From the original intake note:  "[name]" had routine screening mammography at [facility] 08/07/2015. There was an area of calcification in the right breast 2:00 position. There was also an area of calcification in the left breast 7:00 position. She was recalled for bilateral diagnostic mammography 08/12/2015. The left breast calcifications were felt to be likely benign and repeat mammography in 6 months was suggested.  On the right, breast density was category D. There was a 1.2 cm irregular mass at the 6:00, inframammary position and also suspicious calcifications. Adding those together the area measured 2.5 cm. Ultrasound of the right breast on the same day found a 1.3 cm lobulated mass in the right breast at the 6:00 position. There was a 1.7 cm axillary lymph node with a fatty hilum.   On 08/30/2015 the patient underwent biopsy of the breast mass, the abnormal lymph node, and the area of calcifications. The calcifications ([id number]) were associated with ductal carcinoma in situ, with the prognostic panel pending. 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 The lymph node also was positive.  Note that the clip to the patient's breast mass did not deploy.  The patient's subsequent history is as detailed below  INTERVAL HISTORY: Eustaquio Maize returns today for follow-up of her estrogen receptor positive breast cancer. After her August 2017 surgery she proceeded to radiation which she completed in November. She generally did well with this although towards the end she had significant desquamation and she had a funny itch in the back which she couldn't explain until she understood that the radiation was going right through and irritating the skin care. She also felt significant fatigue although she continued to work full time.   On 07/17/2016 she started anastrozole. She has had no side effects from it so far particularly no hot flashes and no vaginal dryness problems. She has slight discomfort in the left wrist and both elbows. She is obtaining the drug currently at no cost.   REVIEW OF SYSTEMS: Eustaquio Maize is having some left knee issues. She is working with Raliegh Ip regarding this and is starting a rehabilitation program tomorrow. She starting to do some yoga. Otherwise a detailed review of systems today was noncontributory   PAST MEDICAL HISTORY: Past Medical History:  Diagnosis Date  . Breast cancer of lower-outer quadrant of right female breast ([place]) 08/19/2015  . History of radiation therapy 04/18/16- 05/30/16   Right Breast 50 Gy in 25 fractions. then boosted to 60 Gy in 5 fractions  . Hot flashes   . PONV (postoperative nausea and vomiting)     PAST SURGICAL HISTORY: Past Surgical History:  Procedure Laterality Date  . BACK SURGERY  2007, 2014   x2, lumbar surgery used same incision for both surgeries.   Marland Kitchen  BREAST LUMPECTOMY WITH RADIOACTIVE SEED AND AXILLARY LYMPH NODE DISSECTION Right 03/08/2016   Procedure: RIGHT BREAST BRACKETED RADIOACTIVE SEED GUIDED LUMPECTOMY RIGHT AXILLARY SENTINEL  LYMPH NODE BIOPSY AND RIGHT RADIOACTIVE SEED TARGETED AXILLARY NODE DISSECTION;  Surgeon: [name], MD;  Location: [facility];  Service: General;  Laterality: Right;  . NECK SURGERY  03-Oct-1999  . PORT-A-CATH REMOVAL Right 03/08/2016   Procedure: REMOVAL PORT-A-CATH;  Surgeon: [name], MD;  Location: [facility];  Service: General;  Laterality: Right;  . PORTACATH PLACEMENT Right 09/09/2015   Procedure: INSERTION PORT-A-CATH WITH [place] ;  Surgeon: [name], MD;  Location: [facility];  Service: General;  Laterality: Right;    FAMILY HISTORY No family history on file. The patient's father died in 10/02/14 at the age of 54. He had significant COPD. The patient's mother died at the age of 54 from Parkinson's disease. The patient has 1 brother, 1 sister. There is no cancer history in the family to her knowledge.  GYNECOLOGIC HISTORY:  No LMP recorded. Patient is postmenopausal. Menarche age 11. She has GI asked P0. She stopped having periods in January 2014. She never took hormone replacement or oral contraceptives.  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 She lives with her partner, [name], and 2 cats.     ADVANCED DIRECTIVES: Not in place. At the 09/01/2015 clinic visit the patient was given the appropriate forms to complete and notarize at her discretion.   HEALTH MAINTENANCE: Social History  Substance Use Topics  . Smoking status: Never Smoker  . Smokeless tobacco: Never Used  . Alcohol use Yes     Comment: social, she drinks one alchohol drink daily.      Colonoscopy: Never   PAP:  Bone density: Never  Lipid panel:  Allergies  Allergen Reactions  . Latex Other (See Comments)    Fever blisters  . Skin Adhesives Dermatitis    Reaction to dermabond, do not use  Is below her leg. He is a little isn't strange people on overdose of this area I have a patient who refuses to hear her CA-125  results  Current Outpatient Prescriptions  Medication Sig Dispense Refill  . anastrozole (ARIMIDEX) 1 MG tablet Take 1 tablet (1 mg total) by mouth daily. 90 tablet 3  . non-metallic deodorant (ALRA) MISC Apply 1 application topically daily as needed.     No current facility-administered medications for this visit.     OBJECTIVE: Middle-aged white woman In no acute distress Vitals:   08/10/16 1255  BP: 130/68  Pulse: 84  Resp: 18  Temp: 97.8 F (36.6 C)     Body mass index is 25.26 kg/m.    ECOG FS:1 - Symptomatic but completely ambulatory  Sclerae unicteric, pupils round and equal Oropharynx clear and moist-- no thrush or other lesions No cervical or supraclavicular adenopathy Lungs no rales or rhonchi Heart regular rate and rhythm Abd soft, nontender, positive bowel sounds MSK no focal spinal tenderness, no upper extremity lymphedema Neuro: nonfocal, well oriented, appropriate affect Breasts: The right breast is status post lumpectomy and radiation. The cosmetic result is excellent. There is no evident of residual or recurrent disease. The right axilla is benign. The left breast is unremarkable.   RESULTS:  CMP     Component Value Date/Time   NA 144 08/10/2016 1242   K 4.1 08/10/2016 1242   CO2 27 08/10/2016 1242   GLUCOSE 104 08/10/2016 1242   BUN 15.6 08/10/2016  1242   CREATININE 0.8 08/10/2016 1242   CALCIUM 9.8 08/10/2016 1242   PROT 6.7 08/10/2016 1242   ALBUMIN 4.3 08/10/2016 1242   AST 23 08/10/2016 1242   ALT 21 08/10/2016 1242   ALKPHOS 96 08/10/2016 1242   BILITOT 0.91 08/10/2016 1242    INo results found for: SPEP, UPEP  Lab Results  Component Value Date   WBC 3.9 08/10/2016   NEUTROABS 2.5 08/10/2016   HGB 12.8 08/10/2016   HCT 38.3 08/10/2016   MCV 93.9 08/10/2016   PLT 196 08/10/2016      Chemistry      Component Value Date/Time   NA 144 08/10/2016 1242   K 4.1 08/10/2016 1242   CO2 27 08/10/2016 1242   BUN 15.6 08/10/2016 1242    CREATININE 0.8 08/10/2016 1242      Component Value Date/Time   CALCIUM 9.8 08/10/2016 1242   ALKPHOS 96 08/10/2016 1242   AST 23 08/10/2016 1242   ALT 21 08/10/2016 1242   BILITOT 0.91 08/10/2016 1242       No results found for: LABCA2  No components found for: DYJWL295  No results for input(s): INR in the last 168 hours.  Urinalysis No results found for: COLORURINE, APPEARANCEUR, LABSPEC, PHURINE, GLUCOSEU, HGBUR, BILIRUBINUR, KETONESUR, PROTEINUR, UROBILINOGEN, NITRITE, LEUKOCYTESUR  ELIGIBLE FOR AVAILABLE RESEARCH PROTOCOL: [facility], [phone]  STUDIES: Repeat mammography and a bone density study scheduled for February   ASSESSMENT: 56 y.o.  woman status post right breast lower outer quadrant and axillary lymph node biopsy 08/30/2015, both positive for a clinical T1-2 N1, stage II invasive ductal carcinoma, grade 3, estrogen and progesterone receptor positive, HER-2/neu nonamplified, with an MIB-1 of 80%  (1) neoadjuvant chemotherapy started 09/16/2015 consisting of doxorubicin and cyclophosphamide in dose dense fashion 4, completed 10/28/2015, followed by paclitaxel weekly 12  started 11/19/2015, completed 02/11/2016  (2) Status post right lumpectomy and sentinel lymph node sampling 03/08/2016 for a residual ypT1b ypN0 invasive ductal carcinoma, grade 2, with focally positive margins not requiring further excision  (3) adjuvant radiation 04/18/16 - 05/30/16 Site/dose:Total dose 60 Gy in 30 fx  (4) anastrozole started 07/17/2016  (5) participating in DCP-001 study   PLAN: benefit is tolerating the anastrozole well and the plan will be to continue that for a total of 5 years.  I'm going to obtain a baseline bone density in February when she has her next set of mammography at [facility].  She didn't come for her survivorship meeting at the completion of radiation for some reason and that is being set up for late February so she can go over the mammography and bone  density results of the same time.  I have encouraged her to increase her exercise program since she has problems with her left knee perhaps stationary bike would be good. I also encourage her to sign up for the finding a normal group here.  Otherwise I will see her again in May. She knows to call for any problems that may develop before that visit.   Chauncey Cruel, MD   08/10/2016 1:30 PM9

## 2016-08-15 ENCOUNTER — Ambulatory Visit: Payer: 59 | Admitting: Physical Therapy

## 2016-08-15 DIAGNOSIS — R2689 Other abnormalities of gait and mobility: Secondary | ICD-10-CM

## 2016-08-15 DIAGNOSIS — M6281 Muscle weakness (generalized): Secondary | ICD-10-CM | POA: Diagnosis not present

## 2016-08-15 DIAGNOSIS — M25562 Pain in left knee: Secondary | ICD-10-CM

## 2016-08-15 DIAGNOSIS — G8929 Other chronic pain: Secondary | ICD-10-CM

## 2016-08-15 NOTE — Therapy (Signed)
[facility] [address], [place], [place], [address] Phone: [phone]   Fax:  [phone]  Physical Therapy Treatment  Patient Details  Name: Michele Alvarez MRN: [id number] Date of Birth: 02-Dec-1960 Referring Provider: Dr. [name]  Encounter Date: 08/15/2016      PT End of Session - 08/15/16 1703    Visit Number 4   Date for PT Re-Evaluation 09/26/16   PT Start Time 1615   PT Stop Time 1705   PT Time Calculation (min) 50 min   Activity Tolerance Patient tolerated treatment well      Past Medical History:  Diagnosis Date  . Breast cancer of lower-outer quadrant of right female breast ([place]) 08/19/2015  . History of radiation therapy 04/18/16- 05/30/16   Right Breast 50 Gy in 25 fractions. then boosted to 60 Gy in 5 fractions  . Hot flashes   . PONV (postoperative nausea and vomiting)     Past Surgical History:  Procedure Laterality Date  . BACK SURGERY  2007, 2014   x2, lumbar surgery used same incision for both surgeries.   Marland Kitchen BREAST LUMPECTOMY WITH RADIOACTIVE SEED AND AXILLARY LYMPH NODE DISSECTION Right 03/08/2016   Procedure: RIGHT BREAST BRACKETED RADIOACTIVE SEED GUIDED LUMPECTOMY RIGHT AXILLARY SENTINEL LYMPH NODE BIOPSY AND RIGHT RADIOACTIVE SEED TARGETED AXILLARY NODE DISSECTION;  Surgeon: [name], MD;  Location: [facility];  Service: General;  Laterality: Right;  . NECK SURGERY  2001  . PORT-A-CATH REMOVAL Right 03/08/2016   Procedure: REMOVAL PORT-A-CATH;  Surgeon: [name], MD;  Location: [facility];  Service: General;  Laterality: Right;  . PORTACATH PLACEMENT Right 09/09/2015   Procedure: INSERTION PORT-A-CATH WITH [place] ;  Surgeon: [name], MD;  Location: [facility];  Service: General;  Laterality: Right;    There were no vitals filed for this visit.      Subjective Assessment - 08/15/16 1619    Subjective Catch this  morning only once today lateral knee > medial knee.     Currently in Pain? No/denies   Pain Score 0-No pain   Pain Location Knee   Pain Orientation Left   Pain Type Chronic pain                         [facility] Adult PT Treatment/Exercise - 08/15/16 0001      Knee/Hip Exercises: Aerobic   Nustep L1 6 min     Knee/Hip Exercises: Standing   Lateral Step Up Left;1 set;10 reps;Hand Hold: 1   SLS with Vectors 4 ways 10x    Other Standing Knee Exercises SLS with green band UE diagonals 10x both ways     Knee/Hip Exercises: Sidelying   Clams 15x      Cryotherapy   Number Minutes Cryotherapy 8 Minutes   Cryotherapy Location Knee   Type of Cryotherapy Ice pack     Manual Therapy   Manual Therapy Taping   McConnell lateral glide McConnell       verbal cues on patellofemoral alignment and glut muscle activation in sidelying and standing          PT Education - 08/15/16 1702    Education provided Yes   Education Details clams;  standing 4 ways;  single leg standing   Person(s) Educated Patient   Methods Explanation;Demonstration;Handout   Comprehension Verbalized understanding;Returned demonstration          PT Short Term Goals - 08/15/16  [place] #1   Title independent with initial HEP   Time 4   Period Weeks   Status On-going     PT SHORT TERM GOAL #2   Title walk around a corner with pain in left knee </= 25%   Time 4   Period Weeks   Status On-going     PT SHORT TERM GOAL #3   Title walking with pain decreased >/= 25%   Time 4   Period Weeks   Status On-going           PT Long Term Goals - 08/15/16 1719      PT LONG TERM GOAL #1   Title independent with HEP   Time 8   Period Weeks   Status On-going     PT LONG TERM GOAL #2   Title go up and down stairs forward with step over step pattern and pain decreased >/= 75%   Time 8   Period Weeks   Status On-going     PT LONG TERM GOAL #3   Title walk with  pain decreased >/= 75% due to strength of left knee and hip >/= 4+/5   Time 8   Period Weeks   Status On-going     PT LONG TERM GOAL #4   Title FOTO score </= 39% limitation   Time 8   Period Weeks   Status On-going               Plan - 08/15/16 1704    Clinical Impression Statement The patient reports she feels her leg is getting stronger.  Intermittent lateral > medial knee pain.  She reports muscular fatigue with glute medius strengthening.  Attempted lateral step ups but audible patellar crepitus with and without strapping tape.  Therapist monitoring response and cueing for patellofemoral alignment.  Also monitoring response of right UE secondary to semi recent surgery.     PT Next Visit Plan assess response to McConnell strapping tape (lateral glide correction) ;  LE strength and stability and flexibility, manual techniques and modalities as needed;  ionto if cert signed      Patient will benefit from skilled therapeutic intervention in order to improve the following deficits and impairments:     Visit Diagnosis: Muscle weakness (generalized)  Chronic pain of left knee  Other abnormalities of gait and mobility     Problem List Patient Active Problem List   Diagnosis Date Noted  . Malignant neoplasm of lower-outer quadrant of right breast of female, estrogen receptor positive ([place]) 08/19/2015   Ruben Im, PT 08/15/16 5:22 PM Phone: [phone] Fax: [phone]  Alvera Singh 08/15/2016, 5:21 PM  [facility] [address], [place], [place], [address] Phone: [phone]   Fax:  [phone]  Name: Michele Alvarez MRN: [id number] Date of Birth: 06/11/1961

## 2016-08-15 NOTE — Patient Instructions (Addendum)
  Abduction: Clam (Eccentric) - Side-Lying      Stand on left LE 4 ways  10x keep knee in neutral position.    Standing on left leg with band pull downs in right hand   15x   Copyright  VHI. All rights reserved.    [employer]el  Therapy wrap  9 inch x 24 inch  Model [id number] [employer] [phone] Free Shipping?    [facility] [address] [place] [employer], [place] [address] Phone # [phone] Fax [phone]     Ruben Im, PT 08/15/16 4:36 PM Phone: [phone] Fax: [phone]

## 2016-08-17 ENCOUNTER — Ambulatory Visit: Payer: 59 | Attending: Sports Medicine | Admitting: Physical Therapy

## 2016-08-17 ENCOUNTER — Encounter: Payer: Self-pay | Admitting: Physical Therapy

## 2016-08-17 DIAGNOSIS — R2689 Other abnormalities of gait and mobility: Secondary | ICD-10-CM | POA: Insufficient documentation

## 2016-08-17 DIAGNOSIS — M25562 Pain in left knee: Secondary | ICD-10-CM | POA: Diagnosis present

## 2016-08-17 DIAGNOSIS — M6281 Muscle weakness (generalized): Secondary | ICD-10-CM | POA: Diagnosis not present

## 2016-08-17 DIAGNOSIS — G8929 Other chronic pain: Secondary | ICD-10-CM | POA: Insufficient documentation

## 2016-08-17 NOTE — Therapy (Signed)
[facility] [address] [place], [place], [address] Phone: [phone]   Fax:  [phone]  Physical Therapy Treatment  Patient Details  Name: Michele Alvarez MRN: [id number] Date of Birth: 20-Dec-1960 Referring Provider: Dr. [name]  Encounter Date: 08/17/2016      PT End of Session - 08/17/16 1707    Visit Number 5   Date for PT Re-Evaluation 09/26/16   PT Start Time 1615   PT Stop Time 1655   PT Time Calculation (min) 40 min   Activity Tolerance Patient tolerated treatment well   Behavior During Therapy [facility] for tasks assessed/performed      Past Medical History:  Diagnosis Date  . Breast cancer of lower-outer quadrant of right female breast ([place]) 08/19/2015  . History of radiation therapy 04/18/16- 05/30/16   Right Breast 50 Gy in 25 fractions. then boosted to 60 Gy in 5 fractions  . Hot flashes   . PONV (postoperative nausea and vomiting)     Past Surgical History:  Procedure Laterality Date  . BACK SURGERY  2007, 2014   x2, lumbar surgery used same incision for both surgeries.   Marland Kitchen BREAST LUMPECTOMY WITH RADIOACTIVE SEED AND AXILLARY LYMPH NODE DISSECTION Right 03/08/2016   Procedure: RIGHT BREAST BRACKETED RADIOACTIVE SEED GUIDED LUMPECTOMY RIGHT AXILLARY SENTINEL LYMPH NODE BIOPSY AND RIGHT RADIOACTIVE SEED TARGETED AXILLARY NODE DISSECTION;  Surgeon: [name], MD;  Location: [facility];  Service: General;  Laterality: Right;  . NECK SURGERY  2001  . PORT-A-CATH REMOVAL Right 03/08/2016   Procedure: REMOVAL PORT-A-CATH;  Surgeon: [name], MD;  Location: [facility];  Service: General;  Laterality: Right;  . PORTACATH PLACEMENT Right 09/09/2015   Procedure: INSERTION PORT-A-CATH WITH [place] ;  Surgeon: [name], MD;  Location: [facility];  Service: General;  Laterality: Right;    There were no vitals filed for this visit.       Subjective Assessment - 08/17/16 1621    Subjective I feel like my knee is getting stronger.  I am doing the exercises at home. Going down steps with pain. Walking around a corner is better.    Patient Stated Goals Strengthening knees   Currently in Pain? No/denies   Multiple Pain Sites No                         [facility] Adult PT Treatment/Exercise - 08/17/16 0001      Knee/Hip Exercises: Stretches   Active Hamstring Stretch Left;Right;30 seconds   Piriformis Stretch Left;Right;1 rep;30 seconds  sitting     Knee/Hip Exercises: Standing   Other Standing Knee Exercises walk sideways with red band round knees     Knee/Hip Exercises: Seated   Long Arc Quad Strengthening;Left;2 sets;10 reps;Weights;Limitations   Long Arc Quad Weight 5 lbs.   Long [employer] Limitations work in range without pain; 10 wtihball squeeze; 10x with red band around knee     Knee/Hip Exercises: Supine   Bridges with Clamshell Both;Strengthening;1 set;5 reps  went slow due to cramp in hamstring   Straight Leg Raises Strengthening;Left;2 sets;10 reps   Straight Leg Raises Limitations 1#     Knee/Hip Exercises: Sidelying   Hip ABduction Strengthening;Left;2 sets;10 reps  1# weight     Manual Therapy   Manual Therapy Soft tissue mobilization;Joint mobilization   Joint Mobilization left patella mobilization with all 4 directions   Soft tissue mobilization soft tissue work  to left iliotibial band, left lafteral quad, superior patella                PT Education - 08/17/16 1702    Education provided No          PT Short Term Goals - 08/15/16 1719      PT SHORT TERM GOAL #1   Title independent with initial HEP   Time 4   Period Weeks   Status On-going     PT SHORT TERM GOAL #2   Title walk around a corner with pain in left knee </= 25%   Time 4   Period Weeks   Status On-going     PT SHORT TERM GOAL #3   Title walking with pain decreased >/= 25%   Time 4   Period Weeks    Status On-going           PT Long Term Goals - 08/15/16 1719      PT LONG TERM GOAL #1   Title independent with HEP   Time 8   Period Weeks   Status On-going     PT LONG TERM GOAL #2   Title go up and down stairs forward with step over step pattern and pain decreased >/= 75%   Time 8   Period Weeks   Status On-going     PT LONG TERM GOAL #3   Title walk with pain decreased >/= 75% due to strength of left knee and hip >/= 4+/5   Time 8   Period Weeks   Status On-going     PT LONG TERM GOAL #4   Title FOTO score </= 39% limitation   Time 8   Period Weeks   Status On-going               Plan - 08/17/16 1702    Clinical Impression Statement Patient feel her left knee is stronger and less pain. Patient has decreased crepitus of left knee.  Patient able to walk with increased left hip extension. Patient feels the taping of her left knee did not help.  Patient will benefit from skilled therapy to reduce pain and monitoring the patellofemoral joint.    Rehab Potential Excellent   Clinical Impairments Affecting Rehab Potential S/P breat cancer-no ultrasound   PT Frequency 2x / week   PT Duration 8 weeks   PT Treatment/Interventions Therapeutic activities;Therapeutic exercise;Neuromuscular re-education;Patient/family education;Iontophoresis 4mg /ml Dexamethasone;Gait training;Manual techniques;Dry needling   PT Next Visit Plan   LE strength and stability and flexibility, manual techniques and modalities as needed;  ionto if cert signed   PT Home Exercise Plan progress as needed   Consulted and Agree with Plan of Care Patient      Patient will benefit from skilled therapeutic intervention in order to improve the following deficits and impairments:  Pain, Decreased strength  Visit Diagnosis: Muscle weakness (generalized)  Chronic pain of left knee     Problem List Patient Active Problem List   Diagnosis Date Noted  . Malignant neoplasm of lower-outer quadrant  of right breast of female, estrogen receptor positive ([place]) 08/19/2015    Earlie Counts, PT 08/17/16 5:08 PM   [facility] [address] [place], [place], [address] Phone: [phone]   Fax:  [phone]  Name: Michele Alvarez MRN: [id number] Date of Birth: 03/12/1961

## 2016-08-21 ENCOUNTER — Encounter: Payer: Self-pay | Admitting: Physical Therapy

## 2016-08-21 ENCOUNTER — Ambulatory Visit: Payer: 59 | Admitting: Physical Therapy

## 2016-08-21 ENCOUNTER — Telehealth: Payer: Self-pay | Admitting: Oncology

## 2016-08-21 DIAGNOSIS — M6281 Muscle weakness (generalized): Secondary | ICD-10-CM

## 2016-08-21 DIAGNOSIS — G8929 Other chronic pain: Secondary | ICD-10-CM

## 2016-08-21 DIAGNOSIS — M25562 Pain in left knee: Secondary | ICD-10-CM

## 2016-08-21 NOTE — Telephone Encounter (Signed)
error 

## 2016-08-21 NOTE — Therapy (Signed)
[facility] [address], [place], [place], [address] Phone: [phone]   Fax:  [phone]  Physical Therapy Treatment  Patient Details  Name: Michele Alvarez MRN: [id number] Date of Birth: 06-14-1961 Referring Provider: Dr. [name]  Encounter Date: 08/21/2016      PT End of Session - 08/21/16 1708    Visit Number 6   Date for PT Re-Evaluation 09/26/16   PT Start Time 1615   PT Stop Time 1655   PT Time Calculation (min) 40 min   Activity Tolerance Patient tolerated treatment well   Behavior During Therapy [facility] for tasks assessed/performed      Past Medical History:  Diagnosis Date  . Breast cancer of lower-outer quadrant of right female breast ([place]) 08/19/2015  . History of radiation therapy 04/18/16- 05/30/16   Right Breast 50 Gy in 25 fractions. then boosted to 60 Gy in 5 fractions  . Hot flashes   . PONV (postoperative nausea and vomiting)     Past Surgical History:  Procedure Laterality Date  . BACK SURGERY  2007, 2014   x2, lumbar surgery used same incision for both surgeries.   Marland Kitchen BREAST LUMPECTOMY WITH RADIOACTIVE SEED AND AXILLARY LYMPH NODE DISSECTION Right 03/08/2016   Procedure: RIGHT BREAST BRACKETED RADIOACTIVE SEED GUIDED LUMPECTOMY RIGHT AXILLARY SENTINEL LYMPH NODE BIOPSY AND RIGHT RADIOACTIVE SEED TARGETED AXILLARY NODE DISSECTION;  Surgeon: [name], MD;  Location: [place];  Service: General;  Laterality: Right;  . NECK SURGERY  2001  . PORT-A-CATH REMOVAL Right 03/08/2016   Procedure: REMOVAL PORT-A-CATH;  Surgeon: [name], MD;  Location: [place];  Service: General;  Laterality: Right;  . PORTACATH PLACEMENT Right 09/09/2015   Procedure: INSERTION PORT-A-CATH WITH [place] ;  Surgeon: [name], MD;  Location: [place];  Service: General;  Laterality: Right;    There were no vitals filed for this visit.       Subjective Assessment - 08/21/16 1626    Subjective I have not had pain until this morning.  I had to go up the steps 2 times this morning.    Patient Stated Goals Strengthening knees   Currently in Pain? No/denies   Pain Score 0-No pain            [facility] PT Assessment - 08/21/16 0001      Strength   Left Knee Flexion 5/5                     [facility] Adult PT Treatment/Exercise - 08/21/16 0001      Knee/Hip Exercises: Stretches   Active Hamstring Stretch Left;Right;30 seconds   Piriformis Stretch Left;Right;1 rep;30 seconds  sitting     Knee/Hip Exercises: Aerobic   Nustep L3 6 min     Knee/Hip Exercises: Standing   Hip Flexion Stengthening;Left;2 sets;10 reps;Knee bent  5#; VC to kneep spinal neutral   Other Standing Knee Exercises walk sideways with red band round knees     Knee/Hip Exercises: Seated   Other Seated Knee/Hip Exercises hip external rotation with red band 2x10     Knee/Hip Exercises: Sidelying   Hip ABduction Strengthening;Left;2 sets;10 reps  1# weight     Manual Therapy   Manual Therapy Soft tissue mobilization;Joint mobilization   Joint Mobilization left patella mobilization with all 4 directions   Soft tissue mobilization soft tissue work to left iliotibial band, left lafteral quad, superior patella  PT Education - 08/21/16 1702    Education provided Yes   Education Details getting a rolling ball to massage the lateral left lower thigh   Person(s) Educated Patient   Methods Explanation;Demonstration;Verbal cues   Comprehension Returned demonstration;Verbalized understanding          PT Short Term Goals - 08/21/16 1628      PT SHORT TERM GOAL #1   Title independent with initial HEP   Time 4   Period Weeks   Status Achieved     PT SHORT TERM GOAL #2   Title walk around a corner with pain in left knee </= 25%   Time 4   Period Weeks   Status Achieved     PT SHORT TERM GOAL #3   Title walking with  pain decreased >/= 25%   Time 4   Period Weeks   Status Achieved           PT Long Term Goals - 08/15/16 1719      PT LONG TERM GOAL #1   Title independent with HEP   Time 8   Period Weeks   Status On-going     PT LONG TERM GOAL #2   Title go up and down stairs forward with step over step pattern and pain decreased >/= 75%   Time 8   Period Weeks   Status On-going     PT LONG TERM GOAL #3   Title walk with pain decreased >/= 75% due to strength of left knee and hip >/= 4+/5   Time 8   Period Weeks   Status On-going     PT LONG TERM GOAL #4   Title FOTO score </= 39% limitation   Time 8   Period Weeks   Status On-going               Plan - 08/21/16 1706    Clinical Impression Statement Patient reports her pain is 25% better.  She did not have pain for 4 days.  Patient has less crepitus in left knee cap compared to initial evaluation.  Patient has met her STG's.  Patient has increased strength of left knee flexion. Patient will benefit from killed therapy to reduce pain and monitoring the patellofemoral joint.    Rehab Potential Excellent   Clinical Impairments Affecting Rehab Potential S/P breat cancer-no ultrasound   PT Frequency 2x / week   PT Duration 8 weeks   PT Treatment/Interventions Therapeutic activities;Therapeutic exercise;Neuromuscular re-education;Patient/family education;Iontophoresis 39m/ml Dexamethasone;Gait training;Manual techniques;Dry needling   PT Next Visit Plan   LE strength and stability and flexibility, manual techniques and modalities as needed;  ionto if cert signed   PT Home Exercise Plan progress as needed   Consulted and Agree with Plan of Care Patient      Patient will benefit from skilled therapeutic intervention in order to improve the following deficits and impairments:  Pain, Decreased strength  Visit Diagnosis: Muscle weakness (generalized)  Chronic pain of left knee     Problem List Patient Active Problem List    Diagnosis Date Noted  . Malignant neoplasm of lower-outer quadrant of right breast of female, estrogen receptor positive [place] 08/19/2015    CEarlie Counts PT 08/21/16 5:09 PM    [facility] [address] [place] [address]Phone: [phone]  Fax:  [phone] Name: ELYNNLEE REVELSMRN: [id number] of Birth: 205-20-1962

## 2016-08-22 ENCOUNTER — Telehealth: Payer: Self-pay | Admitting: Oncology

## 2016-08-22 ENCOUNTER — Telehealth: Payer: Self-pay | Admitting: *Deleted

## 2016-08-22 NOTE — Telephone Encounter (Signed)
sw pt to confirm 6/26 appt at 1 pmper LOS

## 2016-08-22 NOTE — Telephone Encounter (Signed)
This RN returned call to per per VM left yesterday stating " I have some scheduling concerns and am trying to reach someone "  Obtained VM- this RN's name and return number left to return call.

## 2016-08-23 ENCOUNTER — Encounter: Payer: Self-pay | Admitting: Physical Therapy

## 2016-08-23 ENCOUNTER — Ambulatory Visit: Payer: 59 | Admitting: Physical Therapy

## 2016-08-23 DIAGNOSIS — M6281 Muscle weakness (generalized): Secondary | ICD-10-CM | POA: Diagnosis not present

## 2016-08-23 DIAGNOSIS — M25562 Pain in left knee: Secondary | ICD-10-CM

## 2016-08-23 DIAGNOSIS — G8929 Other chronic pain: Secondary | ICD-10-CM

## 2016-08-23 NOTE — Therapy (Signed)
[facility] [address], [place], [place], [address] Phone: [phone]   Fax:  [phone]  Physical Therapy Treatment  Patient Details  Name: Michele Alvarez MRN: [id number] Date of Birth: June 23, 1961 Referring Provider: Dr. [name]  Encounter Date: 08/23/2016      PT End of Session - 08/23/16 1614    Visit Number 7   Date for PT Re-Evaluation 09/26/16   PT Start Time 1614   PT Stop Time 1657   PT Time Calculation (min) 43 min   Activity Tolerance Patient tolerated treatment well   Behavior During Therapy [facility] for tasks assessed/performed      Past Medical History:  Diagnosis Date  . Breast cancer of lower-outer quadrant of right female breast ([place]) 08/19/2015  . History of radiation therapy 04/18/16- 05/30/16   Right Breast 50 Gy in 25 fractions. then boosted to 60 Gy in 5 fractions  . Hot flashes   . PONV (postoperative nausea and vomiting)     Past Surgical History:  Procedure Laterality Date  . BACK SURGERY  2007, 2014   x2, lumbar surgery used same incision for both surgeries.   Marland Kitchen BREAST LUMPECTOMY WITH RADIOACTIVE SEED AND AXILLARY LYMPH NODE DISSECTION Right 03/08/2016   Procedure: RIGHT BREAST BRACKETED RADIOACTIVE SEED GUIDED LUMPECTOMY RIGHT AXILLARY SENTINEL LYMPH NODE BIOPSY AND RIGHT RADIOACTIVE SEED TARGETED AXILLARY NODE DISSECTION;  Surgeon: [name], MD;  Location: [place];  Service: General;  Laterality: Right;  . NECK SURGERY  2001  . PORT-A-CATH REMOVAL Right 03/08/2016   Procedure: REMOVAL PORT-A-CATH;  Surgeon: [name], MD;  Location: [place];  Service: General;  Laterality: Right;  . PORTACATH PLACEMENT Right 09/09/2015   Procedure: INSERTION PORT-A-CATH WITH [place] ;  Surgeon: [name], MD;  Location: [place];  Service: General;  Laterality: Right;    There were no vitals filed for this visit.       Subjective Assessment - 08/23/16 1616    Subjective Pt did yoga last and having some increased knee pain behind knee.    Pertinent History Right breast cancer diagnosed in February 2017 and had neo-adjuvant chemo from March through July.  Lumpectomy with SLNB 03/08/16 and will have XRT; sees rad onc this Friday.  h/o two back surgeries, both L5-S1 microdiscectomies in 2005 and 2014 or 2015, with poor results.  Still has symptoms, including radiculopathy in right leg.  She reports numbness in her right heel and that it feels like she walks with socks wadded up in her shoe; it gets hard and burns and tingles.  Had PT and it just kept getting worse so she just lives with it.  The foot feels harder the more she walks and her walking distance is limited.  Has a "weird sensation" in right foot, difficult to describe.   Patient Stated Goals Strengthening knees   Currently in Pain? Yes   Pain Score 2    Pain Location Knee   Pain Orientation Left   Pain Descriptors / Indicators Sharp   Pain Type Chronic pain   Pain Onset More than a month ago   Pain Frequency Intermittent                         [facility] Adult PT Treatment/Exercise - 08/23/16 0001      Knee/Hip Exercises: Stretches   Active Hamstring Stretch Left;Right;30 seconds   Piriformis Stretch Left;Right;1 rep;30 seconds  sitting  Gastroc Stretch Both;1 rep;10 seconds     Knee/Hip Exercises: Aerobic   Nustep L3 6 min  Therapist present to discuss treatment     Knee/Hip Exercises: Machines for Strengthening   Total Gym Leg Press 3x10 #75  Seat 6     Knee/Hip Exercises: Standing   Hip Flexion --   Hip Abduction Stengthening;Both;2 sets;10 reps   Hip Extension Stengthening;Both;2 sets;10 reps   Other Standing Knee Exercises walk sideways with red band round knees     Manual Therapy   Manual Therapy Soft tissue mobilization;Joint mobilization   Soft tissue mobilization Left knee poplitous, iliotibial band                   PT Short Term Goals - 08/21/16 1628      PT SHORT TERM GOAL #1   Title independent with initial HEP   Time 4   Period Weeks   Status Achieved     PT SHORT TERM GOAL #2   Title walk around a corner with pain in left knee </= 25%   Time 4   Period Weeks   Status Achieved     PT SHORT TERM GOAL #3   Title walking with pain decreased >/= 25%   Time 4   Period Weeks   Status Achieved           PT Long Term Goals - 08/15/16 1719      PT LONG TERM GOAL #1   Title independent with HEP   Time 8   Period Weeks   Status On-going     PT LONG TERM GOAL #2   Title go up and down stairs forward with step over step pattern and pain decreased >/= 75%   Time 8   Period Weeks   Status On-going     PT LONG TERM GOAL #3   Title walk with pain decreased >/= 75% due to strength of left knee and hip >/= 4+/5   Time 8   Period Weeks   Status On-going     PT LONG TERM GOAL #4   Title FOTO score </= 39% limitation   Time 8   Period Weeks   Status On-going               Plan - 08/23/16 1658    Clinical Impression Statement Pt continues to have some pain in Lt knee along IT band and poplitius. Pt able to tolerate all standing exercises well with some quad fatigue. Pt will continue to benefit from skilled therapy for knee stabilization.    Rehab Potential Excellent   Clinical Impairments Affecting Rehab Potential S/P breat cancer-no ultrasound   PT Frequency 2x / week   PT Duration 8 weeks   PT Treatment/Interventions Therapeutic activities;Therapeutic exercise;Neuromuscular re-education;Patient/family education;Iontophoresis 4mg /ml Dexamethasone;Gait training;Manual techniques;Dry needling   PT Next Visit Plan   LE strength and stability and flexibility, manual techniques and modalities as needed;  ionto if cert signed   Consulted and Agree with Plan of Care Patient      Patient will benefit from skilled therapeutic intervention in order to  improve the following deficits and impairments:  Pain, Decreased strength  Visit Diagnosis: Muscle weakness (generalized)  Chronic pain of left knee     Problem List Patient Active Problem List   Diagnosis Date Noted  . Malignant neoplasm of lower-outer quadrant of right breast of female, estrogen receptor positive ([place]) 08/19/2015    Mikle Bosworth PTA 08/23/2016, 5:00 PM  [facility] [address] [place], [place], [address] Phone: [phone]   Fax:  [phone]  Name: ALAYLA MACBRIDE MRN: [id number] Date of Birth: [birth year]

## 2016-08-28 ENCOUNTER — Telehealth: Payer: Self-pay | Admitting: Adult Health

## 2016-08-28 NOTE — Telephone Encounter (Signed)
lvm to inform pt of SCP appt 2/23 at 930 am per LOS

## 2016-08-29 ENCOUNTER — Encounter: Payer: Self-pay | Admitting: Physical Therapy

## 2016-08-29 ENCOUNTER — Ambulatory Visit: Payer: 59 | Admitting: Physical Therapy

## 2016-08-29 DIAGNOSIS — M6281 Muscle weakness (generalized): Secondary | ICD-10-CM

## 2016-08-29 DIAGNOSIS — G8929 Other chronic pain: Secondary | ICD-10-CM

## 2016-08-29 DIAGNOSIS — M25562 Pain in left knee: Secondary | ICD-10-CM

## 2016-08-29 DIAGNOSIS — R2689 Other abnormalities of gait and mobility: Secondary | ICD-10-CM

## 2016-08-29 NOTE — Therapy (Signed)
[facility] [address], [place], [place], [address] Phone: [phone]   Fax:  [phone]  Physical Therapy Treatment  Patient Details  Name: Michele Alvarez MRN: [id number] Date of Birth: 1960-10-12 Referring Provider: Dr. [name]  Encounter Date: 08/29/2016      PT End of Session - 08/29/16 1655    Visit Number 8   Date for PT Re-Evaluation 09/26/16   PT Start Time 1615   PT Stop Time 1655   PT Time Calculation (min) 40 min   Activity Tolerance Patient tolerated treatment well   Behavior During Therapy [facility] for tasks assessed/performed      Past Medical History:  Diagnosis Date  . Breast cancer of lower-outer quadrant of right female breast ([place]) 08/19/2015  . History of radiation therapy 04/18/16- 05/30/16   Right Breast 50 Gy in 25 fractions. then boosted to 60 Gy in 5 fractions  . Hot flashes   . PONV (postoperative nausea and vomiting)     Past Surgical History:  Procedure Laterality Date  . BACK SURGERY  2007, 2014   x2, lumbar surgery used same incision for both surgeries.   Marland Kitchen BREAST LUMPECTOMY WITH RADIOACTIVE SEED AND AXILLARY LYMPH NODE DISSECTION Right 03/08/2016   Procedure: RIGHT BREAST BRACKETED RADIOACTIVE SEED GUIDED LUMPECTOMY RIGHT AXILLARY SENTINEL LYMPH NODE BIOPSY AND RIGHT RADIOACTIVE SEED TARGETED AXILLARY NODE DISSECTION;  Surgeon: [name], MD;  Location: [place];  Service: General;  Laterality: Right;  . NECK SURGERY  2001  . PORT-A-CATH REMOVAL Right 03/08/2016   Procedure: REMOVAL PORT-A-CATH;  Surgeon: [name], MD;  Location: [place];  Service: General;  Laterality: Right;  . PORTACATH PLACEMENT Right 09/09/2015   Procedure: INSERTION PORT-A-CATH WITH [place] ;  Surgeon: [name], MD;  Location: [place];  Service: General;  Laterality: Right;    There were no vitals filed for this visit.       Subjective Assessment - 08/29/16 1619    Subjective The pain is not behind my knee anymore.     Pertinent History Right breast cancer diagnosed in February 2017 and had neo-adjuvant chemo from March through July.  Lumpectomy with SLNB 03/08/16 and will have XRT; sees rad onc this Friday.  h/o two back surgeries, both L5-S1 microdiscectomies in 2005 and 2014 or 2015, with poor results.  Still has symptoms, including radiculopathy in right leg.  She reports numbness in her right heel and that it feels like she walks with socks wadded up in her shoe; it gets hard and burns and tingles.  Had PT and it just kept getting worse so she just lives with it.  The foot feels harder the more she walks and her walking distance is limited.  Has a "weird sensation" in right foot, difficult to describe.   Patient Stated Goals Strengthening knees   Currently in Pain? Yes   Pain Score 1    Pain Location Knee   Pain Orientation Left;Posterior   Pain Descriptors / Indicators Dull   Pain Type Chronic pain   Pain Onset More than a month ago   Pain Frequency Intermittent   Aggravating Factors  walking,    Pain Relieving Factors wearing brace            [facility] PT Assessment - 08/29/16 0001      Strength   Left Hip Flexion 4+/5     Palpation   Palpation comment swelling noted posterior left knee  [facility] Adult PT Treatment/Exercise - 08/29/16 0001      Self-Care   Self-Care Other Self-Care Comments   Other Self-Care Comments  educated patient on Bakers cyst due to swelling posterior knee     Knee/Hip Exercises: Stretches   Active Hamstring Stretch Left;Right;30 seconds   Piriformis Stretch Left;Right;1 rep;30 seconds  sitting   Gastroc Stretch Both;1 rep;10 seconds   Other Knee/Hip Stretches iliotibial band with foam roll bil. then do for hip flexor and quads;      Knee/Hip Exercises: Standing   Terminal Knee Extension Limitations green band 30 times with VC for tighten  buttocks and core   SLS with Vectors 4 ways 10x      Knee/Hip Exercises: Seated   Long Arc Quad Strengthening;Left;2 sets;10 reps;Weights;Limitations   Long Arc Quad Weight 4 lbs.     Manual Therapy   Manual Therapy Soft tissue mobilization;Joint mobilization   Joint Mobilization left patella mobilization with all 4 directions   Soft tissue mobilization left iliotibial band and lower quad                PT Education - 08/29/16 1654    Education provided Yes   Education Details ice and compression to left knee for the area with swelling   Person(s) Educated Patient   Methods Explanation;Demonstration;Verbal cues   Comprehension Verbalized understanding          PT Short Term Goals - 08/21/16 1628      PT SHORT TERM GOAL #1   Title independent with initial HEP   Time 4   Period Weeks   Status Achieved     PT SHORT TERM GOAL #2   Title walk around a corner with pain in left knee </= 25%   Time 4   Period Weeks   Status Achieved     PT SHORT TERM GOAL #3   Title walking with pain decreased >/= 25%   Time 4   Period Weeks   Status Achieved           PT Long Term Goals - 08/29/16 1657      PT LONG TERM GOAL #1   Title independent with HEP   Time 8   Period Weeks   Status On-going     PT LONG TERM GOAL #2   Title go up and down stairs forward with step over step pattern and pain decreased >/= 75%   Time 8   Period Weeks   Status On-going     PT LONG TERM GOAL #3   Title walk with pain decreased >/= 75% due to strength of left knee and hip >/= 4+/5   Time 8   Period Weeks   Status On-going     PT LONG TERM GOAL #4   Title FOTO score </= 39% limitation   Time 8   Period Weeks   Status On-going               Plan - 08/29/16 1655    Clinical Impression Statement Patient has increased left hip flexion strength.  Patient has swelling in posterior left knee that resembles a Bakers cyst.  Patient reports improve pain in left knee. Patient  will benefit from skilled therapy to reduce pain and improve strength.    Rehab Potential Excellent   Clinical Impairments Affecting Rehab Potential S/P breat cancer-no ultrasound   PT Frequency 2x / week   PT Duration 8 weeks   PT Treatment/Interventions Therapeutic activities;Therapeutic exercise;Neuromuscular re-education;Patient/family  education;Iontophoresis 4mg /ml Dexamethasone;Gait training;Manual techniques;Dry needling   PT Next Visit Plan   LE strength and stability and flexibility, manual techniques and modalities as needed;  ionto if cert signed   PT Home Exercise Plan progress as needed   Consulted and Agree with Plan of Care Patient      Patient will benefit from skilled therapeutic intervention in order to improve the following deficits and impairments:  Pain, Decreased strength  Visit Diagnosis: Muscle weakness (generalized)  Chronic pain of left knee  Other abnormalities of gait and mobility     Problem List Patient Active Problem List   Diagnosis Date Noted  . Malignant neoplasm of lower-outer quadrant of right breast of female, estrogen receptor positive ([place]) 08/19/2015    Earlie Counts, PT 08/29/16 4:58 PM     [facility] [address] [place], [place], [address] Phone: [phone]   Fax:  [phone]  Name: Michele Alvarez MRN: [id number] Date of Birth: 1961-02-13

## 2016-08-31 ENCOUNTER — Encounter: Payer: Self-pay | Admitting: Physical Therapy

## 2016-08-31 ENCOUNTER — Ambulatory Visit: Payer: 59 | Admitting: Physical Therapy

## 2016-08-31 DIAGNOSIS — M6281 Muscle weakness (generalized): Secondary | ICD-10-CM

## 2016-08-31 DIAGNOSIS — R2689 Other abnormalities of gait and mobility: Secondary | ICD-10-CM

## 2016-08-31 DIAGNOSIS — M25562 Pain in left knee: Secondary | ICD-10-CM

## 2016-08-31 DIAGNOSIS — G8929 Other chronic pain: Secondary | ICD-10-CM

## 2016-08-31 NOTE — Therapy (Signed)
[facility] [address], [place], [place], [address] Phone: [phone]   Fax:  [phone]  Physical Therapy Treatment  Patient Details  Name: Michele Alvarez MRN: [id number] Date of Birth: [birth year] Referring Provider: Dr. [name]  Encounter Date: 08/31/2016      PT End of Session - 08/31/16 1659    Visit Number 9   Date for PT Re-Evaluation 09/26/16   PT Start Time 1610   PT Stop Time 1658   PT Time Calculation (min) 48 min   Activity Tolerance Patient tolerated treatment well   Behavior During Therapy [facility] for tasks assessed/performed      Past Medical History:  Diagnosis Date  . Breast cancer of lower-outer quadrant of right female breast ([place]) 08/19/2015  . History of radiation therapy 04/18/16- 05/30/16   Right Breast 50 Gy in 25 fractions. then boosted to 60 Gy in 5 fractions  . Hot flashes   . PONV (postoperative nausea and vomiting)     Past Surgical History:  Procedure Laterality Date  . BACK SURGERY  2007, 2014   x2, lumbar surgery used same incision for both surgeries.   Marland Kitchen BREAST LUMPECTOMY WITH RADIOACTIVE SEED AND AXILLARY LYMPH NODE DISSECTION Right 03/08/2016   Procedure: RIGHT BREAST BRACKETED RADIOACTIVE SEED GUIDED LUMPECTOMY RIGHT AXILLARY SENTINEL LYMPH NODE BIOPSY AND RIGHT RADIOACTIVE SEED TARGETED AXILLARY NODE DISSECTION;  Surgeon: [name], MD;  Location: [place];  Service: General;  Laterality: Right;  . NECK SURGERY  2001  . PORT-A-CATH REMOVAL Right 03/08/2016   Procedure: REMOVAL PORT-A-CATH;  Surgeon: [name], MD;  Location: [place];  Service: General;  Laterality: Right;  . PORTACATH PLACEMENT Right 09/09/2015   Procedure: INSERTION PORT-A-CATH WITH [place] ;  Surgeon: [name], MD;  Location: [place];  Service: General;  Laterality: Right;    There were no vitals filed for this visit.       Subjective Assessment - 08/31/16 1622    Subjective I feel better.  I get random weakness. Swelling in back of knee has no change.    Pertinent History Right breast cancer diagnosed in February 2017 and had neo-adjuvant chemo from March through July.  Lumpectomy with SLNB 03/08/16 and will have XRT; sees rad onc this Friday.  h/o two back surgeries, both L5-S1 microdiscectomies in 2005 and 2014 or 2015, with poor results.  Still has symptoms, including radiculopathy in right leg.  She reports numbness in her right heel and that it feels like she walks with socks wadded up in her shoe; it gets hard and burns and tingles.  Had PT and it just kept getting worse so she just lives with it.  The foot feels harder the more she walks and her walking distance is limited.  Has a "weird sensation" in right foot, difficult to describe.   Patient Stated Goals Strengthening knees   Currently in Pain? No/denies            [facility] PT Assessment - 08/31/16 0001      Strength   Left Hip Extension 4+/5   Left Hip External Rotation 5/5   Left Hip ABduction 4/5                     [facility] Adult PT Treatment/Exercise - 08/31/16 0001      Knee/Hip Exercises: Stretches   Piriformis Stretch Left;Right;1 rep;30 seconds  sitting     Knee/Hip Exercises: Aerobic  Nustep L3 8 min  Therapist present to discuss treatment     Knee/Hip Exercises: Machines for Strengthening   Total Gym Leg Press 3x10 #75  Seat 6     Knee/Hip Exercises: Standing   Terminal Knee Extension Limitations green band 30 times with VC for tighten buttocks and core   SLS with Vectors 4 ways 10x      Knee/Hip Exercises: Seated   Long Arc Quad Strengthening;Left;2 sets;10 reps;Weights;Limitations   Long Arc Quad Weight 4 lbs.   Hamstring Curl Strengthening;Left;2 sets;15 reps  green band     Knee/Hip Exercises: Sidelying   Hip ABduction Strengthening;Left;10 reps;3 sets   Hip ABduction Limitations sidely left hip abduction  with tap forward then backward     Manual Therapy   Manual Therapy Soft tissue mobilization;Joint mobilization   Joint Mobilization left patella mobilization with all 4 directions   Soft tissue mobilization left iliotibial band and lower quad                PT Education - 08/31/16 1659    Education provided No          PT Short Term Goals - 08/21/16 1628      PT SHORT TERM GOAL #1   Title independent with initial HEP   Time 4   Period Weeks   Status Achieved     PT SHORT TERM GOAL #2   Title walk around a corner with pain in left knee </= 25%   Time 4   Period Weeks   Status Achieved     PT SHORT TERM GOAL #3   Title walking with pain decreased >/= 25%   Time 4   Period Weeks   Status Achieved           PT Long Term Goals - 08/29/16 1657      PT LONG TERM GOAL #1   Title independent with HEP   Time 8   Period Weeks   Status On-going     PT LONG TERM GOAL #2   Title go up and down stairs forward with step over step pattern and pain decreased >/= 75%   Time 8   Period Weeks   Status On-going     PT LONG TERM GOAL #3   Title walk with pain decreased >/= 75% due to strength of left knee and hip >/= 4+/5   Time 8   Period Weeks   Status On-going     PT LONG TERM GOAL #4   Title FOTO score </= 39% limitation   Time 8   Period Weeks   Status On-going               Plan - 08/31/16 1700    Clinical Impression Statement Patient has increased strength of left hip and knee.  Patient continues to have swelling in posterior left knee.  Patient gets muscle burning with hip abduction muscle.  Patient has good left patella mobility.  Patient has improved soft tissue mobility of left iliotibial band and lateral left quad. Patient will benefit from skilled therapy to reduce pain and improve strength.    Rehab Potential Excellent   Clinical Impairments Affecting Rehab Potential S/P breat cancer-no ultrasound   PT Frequency 2x / week   PT Duration 8  weeks   PT Treatment/Interventions Therapeutic activities;Therapeutic exercise;Neuromuscular re-education;Patient/family education;Iontophoresis 4mg /ml Dexamethasone;Gait training;Manual techniques;Dry needling   PT Next Visit Plan   LE strength and stability and flexibility, manual techniques and modalities  as needed;  ionto if cert signed   PT Home Exercise Plan progress as needed   Consulted and Agree with Plan of Care Patient      Patient will benefit from skilled therapeutic intervention in order to improve the following deficits and impairments:  Pain, Decreased strength  Visit Diagnosis: Muscle weakness (generalized)  Chronic pain of left knee  Other abnormalities of gait and mobility     Problem List Patient Active Problem List   Diagnosis Date Noted  . Malignant neoplasm of lower-outer quadrant of right breast of female, estrogen receptor positive ([place]) 08/19/2015    Earlie Counts, PT 08/31/16 5:04 PM   [facility] [address], [place], [place], [address] Phone: [phone]   Fax:  [phone]  Name: KEIDRA DOOSE MRN: [id number] Date of Birth: 1961-01-25

## 2016-09-04 ENCOUNTER — Telehealth: Payer: Self-pay | Admitting: Adult Health

## 2016-09-04 NOTE — Telephone Encounter (Signed)
Pt called to r/s [facility] appt and will call back to r/s at later date

## 2016-09-05 ENCOUNTER — Ambulatory Visit: Payer: 59 | Admitting: Physical Therapy

## 2016-09-05 ENCOUNTER — Encounter: Payer: Self-pay | Admitting: Physical Therapy

## 2016-09-05 DIAGNOSIS — G8929 Other chronic pain: Secondary | ICD-10-CM

## 2016-09-05 DIAGNOSIS — M6281 Muscle weakness (generalized): Secondary | ICD-10-CM | POA: Diagnosis not present

## 2016-09-05 DIAGNOSIS — R2689 Other abnormalities of gait and mobility: Secondary | ICD-10-CM

## 2016-09-05 DIAGNOSIS — M25562 Pain in left knee: Secondary | ICD-10-CM

## 2016-09-05 NOTE — Therapy (Signed)
[facility] [address], [place], [place], [address] Phone: [phone]   Fax:  [phone]  Physical Therapy Treatment  Patient Details  Name: Michele Alvarez MRN: [id number] Date of Birth: 12-14-1960 Referring Provider: Dr. [name]  Encounter Date: 09/05/2016      PT End of Session - 09/05/16 1653    Visit Number 10   PT Start Time [id number]   PT Stop Time 1650   PT Time Calculation (min) 40 min   Activity Tolerance Patient tolerated treatment well   Behavior During Therapy [facility] for tasks assessed/performed      Past Medical History:  Diagnosis Date  . Breast cancer of lower-outer quadrant of right female breast ([place]) 08/19/2015  . History of radiation therapy 04/18/16- 05/30/16   Right Breast 50 Gy in 25 fractions. then boosted to 60 Gy in 5 fractions  . Hot flashes   . PONV (postoperative nausea and vomiting)     Past Surgical History:  Procedure Laterality Date  . BACK SURGERY  2007, 2014   x2, lumbar surgery used same incision for both surgeries.   Marland Kitchen BREAST LUMPECTOMY WITH RADIOACTIVE SEED AND AXILLARY LYMPH NODE DISSECTION Right 03/08/2016   Procedure: RIGHT BREAST BRACKETED RADIOACTIVE SEED GUIDED LUMPECTOMY RIGHT AXILLARY SENTINEL LYMPH NODE BIOPSY AND RIGHT RADIOACTIVE SEED TARGETED AXILLARY NODE DISSECTION;  Surgeon: [name], MD;  Location: [facility];  Service: General;  Laterality: Right;  . NECK SURGERY  2001  . PORT-A-CATH REMOVAL Right 03/08/2016   Procedure: REMOVAL PORT-A-CATH;  Surgeon: [name], MD;  Location: [facility];  Service: General;  Laterality: Right;  . PORTACATH PLACEMENT Right 09/09/2015   Procedure: INSERTION PORT-A-CATH WITH [place] ;  Surgeon: [name], MD;  Location: [facility];  Service: General;  Laterality: Right;    There were no vitals filed for this visit.      Subjective Assessment - 09/05/16 1620    Subjective Today is the first day my knee has felt the best.    Pertinent History Right breast cancer diagnosed in February 2017 and had neo-adjuvant chemo from March through July.  Lumpectomy with SLNB 03/08/16 and will have XRT; sees rad onc this Friday.  h/o two back surgeries, both L5-S1 microdiscectomies in 2005 and 2014 or 2015, with poor results.  Still has symptoms, including radiculopathy in right leg.  She reports numbness in her right heel and that it feels like she walks with socks wadded up in her shoe; it gets hard and burns and tingles.  Had PT and it just kept getting worse so she just lives with it.  The foot feels harder the more she walks and her walking distance is limited.  Has a "weird sensation" in right foot, difficult to describe.   Patient Stated Goals Strengthening knees   Currently in Pain? No/denies                         [facility] Adult PT Treatment/Exercise - 09/05/16 0001      Knee/Hip Exercises: Aerobic   Nustep L4 8 min; no hands; seat #9   Therapist present to discuss treatment     Knee/Hip Exercises: Machines for Strengthening   Total Gym Leg Press 3x10 #75  Seat 6; slight discomfort in lateral ant. knee     Knee/Hip Exercises: Standing   Knee Flexion Left;Strengthening;3 sets;10 reps  10#   Terminal Knee Extension Limitations green band 30  times with VC for tighten buttocks and core   SLS with Vectors 4 ways 10x    Other Standing Knee Exercises stand on flat side of BOSU ball keeping balance, arm movements and minisquats   Other Standing Knee Exercises stand on left leg and reach to pick up 3 cones from floor in different directions     Knee/Hip Exercises: Seated   Long Arc Quad Strengthening;Left;2 sets;10 reps;Weights;Limitations   Long Arc Quad Weight 5 lbs.                PT Education - 09/05/16 1653    Education provided No          PT Short Term Goals - 08/21/16 1628      PT SHORT TERM GOAL #1   Title independent  with initial HEP   Time 4   Period Weeks   Status Achieved     PT SHORT TERM GOAL #2   Title walk around a corner with pain in left knee </= 25%   Time 4   Period Weeks   Status Achieved     PT SHORT TERM GOAL #3   Title walking with pain decreased >/= 25%   Time 4   Period Weeks   Status Achieved           PT Long Term Goals - 09/05/16 1620      PT LONG TERM GOAL #1   Title independent with HEP   Time 8   Period Weeks   Status On-going     PT LONG TERM GOAL #2   Title go up and down stairs forward with step over step pattern and pain decreased >/= 75%   Time 8   Period Weeks   Status On-going  go down steps slow     PT LONG TERM GOAL #3   Title walk with pain decreased >/= 75% due to strength of left knee and hip >/= 4+/5   Time 8   Period Weeks   Status On-going     PT LONG TERM GOAL #4   Title FOTO score </= 39% limitation   Time 8   Period Weeks   Status On-going               Plan - 09/05/16 1653    Clinical Impression Statement Patient has decreased pain.  Patient was able to use increased weight with exercises.  Patient had some discomfort with leg press with left lateral knee.  Patient has increased control with single leg stance exercises. Patient will benefit from skilled therapy to reduce pain and improve strength.    Rehab Potential Excellent   Clinical Impairments Affecting Rehab Potential S/P breat cancer-no ultrasound   PT Frequency 2x / week   PT Duration 8 weeks   PT Treatment/Interventions Therapeutic activities;Therapeutic exercise;Neuromuscular re-education;Patient/family education;Iontophoresis 4mg /ml Dexamethasone;Gait training;Manual techniques;Dry needling   PT Next Visit Plan   LE strength and stability and flexibility,    PT Home Exercise Plan progress as needed      Patient will benefit from skilled therapeutic intervention in order to improve the following deficits and impairments:  Pain, Decreased strength  Visit  Diagnosis: Muscle weakness (generalized)  Chronic pain of left knee  Other abnormalities of gait and mobility     Problem List Patient Active Problem List   Diagnosis Date Noted  . Malignant neoplasm of lower-outer quadrant of right breast of female, estrogen receptor positive ([place]) 08/19/2015    Earlie Counts, PT  09/05/16 4:56 PM   [facility] [address], [place], [place], [address] Phone: [phone]   Fax:  [phone]  Name: Michele Alvarez MRN: [id number] Date of Birth: 20-Mar-1961

## 2016-09-07 ENCOUNTER — Encounter: Payer: Self-pay | Admitting: Physical Therapy

## 2016-09-07 ENCOUNTER — Ambulatory Visit: Payer: 59 | Admitting: Physical Therapy

## 2016-09-07 DIAGNOSIS — R2689 Other abnormalities of gait and mobility: Secondary | ICD-10-CM

## 2016-09-07 DIAGNOSIS — G8929 Other chronic pain: Secondary | ICD-10-CM

## 2016-09-07 DIAGNOSIS — M25562 Pain in left knee: Secondary | ICD-10-CM

## 2016-09-07 DIAGNOSIS — M6281 Muscle weakness (generalized): Secondary | ICD-10-CM | POA: Diagnosis not present

## 2016-09-07 NOTE — Therapy (Signed)
[facility] [address] [place], [place], [address] Phone: [phone]   Fax:  [phone]  Physical Therapy Treatment  Patient Details  Name: Michele Alvarez MRN: [id number] Date of Birth: 1961/05/22 Referring Provider: Dr. [name]  Encounter Date: 09/07/2016      PT End of Session - 09/07/16 1658    Visit Number 11   Date for PT Re-Evaluation 09/26/16   PT Start Time 1615   PT Stop Time 1655   PT Time Calculation (min) 40 min   Activity Tolerance Patient tolerated treatment well   Behavior During Therapy [facility] for tasks assessed/performed      Past Medical History:  Diagnosis Date  . Breast cancer of lower-outer quadrant of right female breast ([place]) 08/19/2015  . History of radiation therapy 04/18/16- 05/30/16   Right Breast 50 Gy in 25 fractions. then boosted to 60 Gy in 5 fractions  . Hot flashes   . PONV (postoperative nausea and vomiting)     Past Surgical History:  Procedure Laterality Date  . BACK SURGERY  2007, 2014   x2, lumbar surgery used same incision for both surgeries.   Marland Kitchen BREAST LUMPECTOMY WITH RADIOACTIVE SEED AND AXILLARY LYMPH NODE DISSECTION Right 03/08/2016   Procedure: RIGHT BREAST BRACKETED RADIOACTIVE SEED GUIDED LUMPECTOMY RIGHT AXILLARY SENTINEL LYMPH NODE BIOPSY AND RIGHT RADIOACTIVE SEED TARGETED AXILLARY NODE DISSECTION;  Surgeon: [name], MD;  Location: [place] [facility];  Service: General;  Laterality: Right;  . NECK SURGERY  2001  . PORT-A-CATH REMOVAL Right 03/08/2016   Procedure: REMOVAL PORT-A-CATH;  Surgeon: [name], MD;  Location: [place];  Service: General;  Laterality: Right;  . PORTACATH PLACEMENT Right 09/09/2015   Procedure: INSERTION PORT-A-CATH WITH [place] ;  Surgeon: [name], MD;  Location: [facility];  Service: General;  Laterality: Right;    There were no vitals filed for this visit.       Subjective Assessment - 09/07/16 1625    Subjective I feel good today   Pertinent History Right breast cancer diagnosed in February 2017 and had neo-adjuvant chemo from March through July.  Lumpectomy with SLNB 03/08/16 and will have XRT; sees rad onc this Friday.  h/o two back surgeries, both L5-S1 microdiscectomies in 2005 and 2014 or 2015, with poor results.  Still has symptoms, including radiculopathy in right leg.  She reports numbness in her right heel and that it feels like she walks with socks wadded up in her shoe; it gets hard and burns and tingles.  Had PT and it just kept getting worse so she just lives with it.  The foot feels harder the more she walks and her walking distance is limited.  Has a "weird sensation" in right foot, difficult to describe.   Patient Stated Goals Strengthening knees   Currently in Pain? No/denies                         [facility] Adult PT Treatment/Exercise - 09/07/16 0001      Knee/Hip Exercises: Stretches   Active Hamstring Stretch Left;Right;2 reps;30 seconds   Piriformis Stretch Left;Right;30 seconds;2 reps     Knee/Hip Exercises: Aerobic   Nustep L4 8 min; no hands; seat #9   Therapist present to discuss treatment     Knee/Hip Exercises: Machines for Strengthening   Total Gym Leg Press 3x10 #75  Seat 6; slight discomfort in lateral ant. knee  Knee/Hip Exercises: Standing   Knee Flexion Left;Strengthening;3 sets;10 reps  5#   SLS with Vectors 4 ways 10x    Other Standing Knee Exercises stand on flat side of BOSU ball keeping balance, arm movements and minisquats   Other Standing Knee Exercises stand on left leg and reach to pick up 3 cones from floor in different directions     Knee/Hip Exercises: Seated   Long Arc Quad Strengthening;Left;2 sets;10 reps;Weights;Limitations   Long Arc Quad Weight 5 lbs.   Hamstring Curl Strengthening;Left;3 sets;10 reps;Weights    Hamstring Weights 5 lbs.                  PT  Short Term Goals - 08/21/16 1628      PT SHORT TERM GOAL #1   Title independent with initial HEP   Time 4   Period Weeks   Status Achieved     PT SHORT TERM GOAL #2   Title walk around a corner with pain in left knee </= 25%   Time 4   Period Weeks   Status Achieved     PT SHORT TERM GOAL #3   Title walking with pain decreased >/= 25%   Time 4   Period Weeks   Status Achieved           PT Long Term Goals - 09/05/16 1620      PT LONG TERM GOAL #1   Title independent with HEP   Time 8   Period Weeks   Status On-going     PT LONG TERM GOAL #2   Title go up and down stairs forward with step over step pattern and pain decreased >/= 75%   Time 8   Period Weeks   Status On-going  go down steps slow     PT LONG TERM GOAL #3   Title walk with pain decreased >/= 75% due to strength of left knee and hip >/= 4+/5   Time 8   Period Weeks   Status On-going     PT LONG TERM GOAL #4   Title FOTO score </= 39% limitation   Time 8   Period Weeks   Status On-going               Plan - 09/07/16 1659    Clinical Impression Statement Patient able to exercise without pain.  Patient only has a feeling of patella sliding our when she goes around a particular corner at work.  Patient has good control with single leg stance activities.  Patient will benefit from skilled therapy to reduce pain and improve strength.    Rehab Potential Excellent   Clinical Impairments Affecting Rehab Potential S/P breat cancer-no ultrasound   PT Frequency 2x / week   PT Duration 8 weeks   PT Treatment/Interventions Therapeutic activities;Therapeutic exercise;Neuromuscular re-education;Patient/family education;Iontophoresis 4mg /ml Dexamethasone;Gait training;Manual techniques;Dry needling   PT Next Visit Plan   LE strength and stability and flexibility, If no difficulties then discharge.   PT Home Exercise Plan progress as needed   Consulted and Agree with Plan of Care Patient      Patient  will benefit from skilled therapeutic intervention in order to improve the following deficits and impairments:  Pain, Decreased strength  Visit Diagnosis: Muscle weakness (generalized)  Chronic pain of left knee  Other abnormalities of gait and mobility     Problem List Patient Active Problem List   Diagnosis Date Noted  . Malignant neoplasm of lower-outer quadrant of right  breast of female, estrogen receptor positive ([place]) 08/19/2015    Earlie Counts, PT 09/07/16 5:02 PM   [facility] [address], [place], [place], [address] Phone: [phone]   Fax:  [phone]  Name: Michele Alvarez MRN: [id number] Date of Birth: 07-Jul-1961

## 2016-09-08 ENCOUNTER — Encounter: Payer: 59 | Admitting: Adult Health

## 2016-09-11 ENCOUNTER — Ambulatory Visit: Payer: 59 | Admitting: Adult Health

## 2016-09-12 ENCOUNTER — Ambulatory Visit: Payer: 59 | Admitting: Physical Therapy

## 2016-09-12 ENCOUNTER — Encounter: Payer: Self-pay | Admitting: Physical Therapy

## 2016-09-12 DIAGNOSIS — R2689 Other abnormalities of gait and mobility: Secondary | ICD-10-CM

## 2016-09-12 DIAGNOSIS — M25562 Pain in left knee: Secondary | ICD-10-CM

## 2016-09-12 DIAGNOSIS — M6281 Muscle weakness (generalized): Secondary | ICD-10-CM

## 2016-09-12 DIAGNOSIS — G8929 Other chronic pain: Secondary | ICD-10-CM

## 2016-09-12 NOTE — Therapy (Signed)
[facility] [address] [place], [place], [address] Phone: [phone]   Fax:  [phone]  Physical Therapy Treatment  Patient Details  Name: Michele Alvarez MRN: [id number] Date of Birth: 1961/01/02 Referring Provider: Dr. Berle Mull  Encounter Date: 09/12/2016      PT End of Session - 09/12/16 1656    Visit Number 12   Date for PT Re-Evaluation 09/26/16   PT Start Time 1615   PT Stop Time 1655   PT Time Calculation (min) 40 min   Activity Tolerance Patient tolerated treatment well   Behavior During Therapy [facility] for tasks assessed/performed      Past Medical History:  Diagnosis Date  . Breast cancer of lower-outer quadrant of right female breast ([place]) 08/19/2015  . History of radiation therapy 04/18/16- 05/30/16   Right Breast 50 Gy in 25 fractions. then boosted to 60 Gy in 5 fractions  . Hot flashes   . PONV (postoperative nausea and vomiting)     Past Surgical History:  Procedure Laterality Date  . BACK SURGERY  2007, 2014   x2, lumbar surgery used same incision for both surgeries.   Marland Kitchen BREAST LUMPECTOMY WITH RADIOACTIVE SEED AND AXILLARY LYMPH NODE DISSECTION Right 03/08/2016   Procedure: RIGHT BREAST BRACKETED RADIOACTIVE SEED GUIDED LUMPECTOMY RIGHT AXILLARY SENTINEL LYMPH NODE BIOPSY AND RIGHT RADIOACTIVE SEED TARGETED AXILLARY NODE DISSECTION;  Surgeon: [name], MD;  Location: [place];  Service: General;  Laterality: Right;  . NECK SURGERY  2001  . PORT-A-CATH REMOVAL Right 03/08/2016   Procedure: REMOVAL PORT-A-CATH;  Surgeon: [name], MD;  Location: [place];  Service: General;  Laterality: Right;  . PORTACATH PLACEMENT Right 09/09/2015   Procedure: INSERTION PORT-A-CATH WITH [place] ;  Surgeon: [name], MD;  Location: [place];  Service: General;  Laterality: Right;    There were no vitals filed for this visit.       Subjective Assessment - 09/12/16 1620    Subjective I am feeling better.  I am ready for discharge.    Pertinent History Right breast cancer diagnosed in February 2017 and had neo-adjuvant chemo from March through July.  Lumpectomy with SLNB 03/08/16 and will have XRT; sees rad onc this Friday.  h/o two back surgeries, both L5-S1 microdiscectomies in 2005 and 2014 or 2015, with poor results.  Still has symptoms, including radiculopathy in right leg.  She reports numbness in her right heel and that it feels like she walks with socks wadded up in her shoe; it gets hard and burns and tingles.  Had PT and it just kept getting worse so she just lives with it.  The foot feels harder the more she walks and her walking distance is limited.  Has a "weird sensation" in right foot, difficult to describe.   Patient Stated Goals Strengthening knees   Currently in Pain? No/denies            [facility] PT Assessment - 09/12/16 0001      Assessment   Medical Diagnosis left chondromalcia patella   Referring Provider Dr. Berle Mull   Onset Date/Surgical Date 04/16/16   Prior Therapy None     Precautions   Precautions Other (comment)   Precaution Comments cancer precautions     Restrictions   Weight Bearing Restrictions No     Balance Screen   Has the patient fallen in the past 6 months No   Has the patient had  a decrease in activity level because of a fear of falling?  No   Is the patient reluctant to leave their home because of a fear of falling?  No     Home Ecologist residence     Prior Function   Level of Independence Independent     Observation/Other Assessments   Focus on Therapeutic Outcomes (FOTO)  28% limitation     Posture/Postural Control   Posture/Postural Control No significant limitations     AROM   Overall AROM Comments left hip extension is full     Strength   Left Hip Flexion 5/5   Left Hip Extension 5/5   Left Hip External Rotation 5/5    Left Hip ABduction 4+/5   Left Knee Flexion 5/5   Left Knee Extension 4+/5     Palpation   Palpation comment no swelling in left knee     Patellofemoral Grind test (Clark's Sign)   Findings Postive   Side  Left   Comments grinding decreased by 75%     Ambulation/Gait   Ambulation/Gait Yes   Gait Pattern Within Functional Limits                     [facility] Adult PT Treatment/Exercise - 09/12/16 0001      Knee/Hip Exercises: Aerobic   Nustep L5 8 min; no hands; seat #9   Therapist present to discuss treatment     Knee/Hip Exercises: Machines for Strengthening   Total Gym Leg Press 3x10 #75  Seat 6; slight discomfort in lateral ant. knee     Knee/Hip Exercises: Seated   Long Arc Quad Strengthening;Left;2 sets;10 reps;Weights;Limitations   Long Arc Quad Weight 5 lbs.                PT Education - 09/12/16 1655    Education provided Yes   Education Details knee strengthening exercises   Person(s) Educated Patient   Methods Explanation;Demonstration;Verbal cues;Handout   Comprehension Returned demonstration;Verbalized understanding          PT Short Term Goals - 08/21/16 1628      PT SHORT TERM GOAL #1   Title independent with initial HEP   Time 4   Period Weeks   Status Achieved     PT SHORT TERM GOAL #2   Title walk around a corner with pain in left knee </= 25%   Time 4   Period Weeks   Status Achieved     PT SHORT TERM GOAL #3   Title walking with pain decreased >/= 25%   Time 4   Period Weeks   Status Achieved           PT Long Term Goals - 09/12/16 1658      PT LONG TERM GOAL #1   Title independent with HEP   Time 8   Period Weeks   Status Achieved     PT LONG TERM GOAL #2   Title go up and down stairs forward with step over step pattern and pain decreased >/= 75%   Time 8   Period Weeks   Status Achieved     PT LONG TERM GOAL #3   Title walk with pain decreased >/= 75% due to strength of left knee and hip >/= 4+/5    Time 8   Period Weeks   Status Achieved     PT LONG TERM GOAL #4   Title FOTO score </= 39% limitation  Time 8   Period Weeks   Status Achieved               Plan - 09/12/16 1656    Clinical Impression Statement Patient has met her goals.  Patient strength is 5/5 with exception of left knee extension is 4+/5.  Patient has 75% decreased crepitus of left knee.  Patient has full left hip extension and no deficits. Patient is independent with HEP.    Rehab Potential Excellent   Clinical Impairments Affecting Rehab Potential S/P breat cancer-no ultrasound   PT Treatment/Interventions Therapeutic activities;Therapeutic exercise;Neuromuscular re-education;Patient/family education;Iontophoresis 25m/ml Dexamethasone;Gait training;Manual techniques;Dry needling   PT Next Visit Plan Discharge to HEP   PT Home Exercise Plan Current HEP   Consulted and Agree with Plan of Care Patient      Patient will benefit from skilled therapeutic intervention in order to improve the following deficits and impairments:  Pain, Decreased strength  Visit Diagnosis: Muscle weakness (generalized)  Chronic pain of left knee  Other abnormalities of gait and mobility     Problem List Patient Active Problem List   Diagnosis Date Noted  . Malignant neoplasm of lower-outer quadrant of right breast of female, estrogen receptor positive [place] 08/19/2015    CEarlie Counts PT 09/12/16 5:00 PM   [facility] [address] [place] [address]Phone: [phone]  Fax:  [phone] Name: ECALVIN CHURAMRN: [id number] of Birth: 211/25/1962 PHYSICAL THERAPY DISCHARGE SUMMARY  Visits from Start of Care: 12  Current functional level related to goals / functional outcomes: See above.   Remaining deficits: See above.   Education / Equipment: HEP Plan: Patient agrees to discharge.  Patient goals were met.  Patient is being discharged due to meeting the stated rehab goals.  Thank you for the referral. CEarlie Counts PT 09/12/16 5:01 PM  ?????

## 2016-09-12 NOTE — Patient Instructions (Signed)
EXTENSION: Sitting - Resistance Band (Active)    Sit with feet flat. Against yellow resistance band, straighten right knee. Complete _3__ sets of _10__ repetitions. Perform _1__ sessions per day. Green band under and crisscross over foot. Copyright  VHI. All rights reserved.   FLEXION: Sitting - Resistance Band (Active)    Sit with right leg extended. Against yellow resistance band, bend knee and draw foot backward. Complete _3__ sets of _10__ repetitions. Perform __1_ sessions per day. Green band.  http://gtsc.exer.us/231   Copyright  VHI. All rights reserved.   Knee Extension: Terminal - Standing (Single Leg)    Face anchor in shoulder width stance, band around knee. Allow tension of band to slightly bend knee. Pull leg back, straightening knee. Repeat _10_ times per set. Repeat with other leg. Do _1_ sets per session. Do _1_ sessions per week. Anchor Height: Knee Green band http://tub.exer.us/36     Begin in supine position with knees bent. Place band around one foot in tabletop position. Push out leg to straighten knee with resistance. Slowly return to bent knee position and repeat 3 sets of 10.  1 time per day.   [place] [address], [place], [place] [address] Phone # [phone] Fax [phone]   Copyright  VHI. All rights reserved.

## 2016-10-24 ENCOUNTER — Ambulatory Visit (HOSPITAL_COMMUNITY): Payer: 59

## 2016-10-24 ENCOUNTER — Inpatient Hospital Stay (HOSPITAL_COMMUNITY): Admission: RE | Admit: 2016-10-24 | Payer: 59 | Source: Ambulatory Visit

## 2016-10-30 ENCOUNTER — Other Ambulatory Visit: Payer: Self-pay | Admitting: Oncology

## 2016-11-27 ENCOUNTER — Ambulatory Visit: Payer: 59 | Admitting: Oncology

## 2016-11-27 ENCOUNTER — Other Ambulatory Visit: Payer: 59

## 2017-01-05 ENCOUNTER — Encounter (HOSPITAL_COMMUNITY): Payer: Self-pay

## 2017-01-05 ENCOUNTER — Ambulatory Visit (HOSPITAL_COMMUNITY)
Admission: RE | Admit: 2017-01-05 | Discharge: 2017-01-05 | Disposition: A | Discharge status: Home / Self Care | Payer: 59 | Source: Ambulatory Visit | Attending: Internal Medicine | Admitting: Internal Medicine

## 2017-01-05 ENCOUNTER — Ambulatory Visit (HOSPITAL_BASED_OUTPATIENT_CLINIC_OR_DEPARTMENT_OTHER)
Admission: RE | Admit: 2017-01-05 | Discharge: 2017-01-05 | Disposition: A | Discharge status: Home / Self Care | Payer: 59 | Source: Ambulatory Visit | Attending: Internal Medicine | Admitting: Internal Medicine

## 2017-01-05 VITALS — BP 116/69 | HR 64 | Ht 68.0 in | Wt 163.5 lb

## 2017-01-05 DIAGNOSIS — C50511 Malignant neoplasm of lower-outer quadrant of right female breast: Secondary | ICD-10-CM | POA: Insufficient documentation

## 2017-01-05 DIAGNOSIS — Z9221 Personal history of antineoplastic chemotherapy: Secondary | ICD-10-CM | POA: Insufficient documentation

## 2017-01-05 DIAGNOSIS — I340 Nonrheumatic mitral (valve) insufficiency: Secondary | ICD-10-CM | POA: Insufficient documentation

## 2017-01-05 DIAGNOSIS — Z79899 Other long term (current) drug therapy: Secondary | ICD-10-CM | POA: Insufficient documentation

## 2017-01-05 DIAGNOSIS — Z170 Estrogen receptor positive status [ER+]: Secondary | ICD-10-CM | POA: Diagnosis not present

## 2017-01-05 LAB — ECHOCARDIOGRAM COMPLETE
AVLVOTPG: 3 mmHg
CHL CUP TV REG PEAK VELOCITY: 222 cm/s
EERAT: 5.3
EWDT: 173 ms
FS: 23 % — AB (ref 28–44)
IVS/LV PW RATIO, ED: 0.7
LA diam end sys: 29 mm
LA vol A4C: 40 ml
LA vol: 42.6 mL
LADIAMINDEX: 1.53 cm/m2
LASIZE: 29 mm
LAVOLIN: 22.5 mL/m2
LV PW d: 9.19 mm — AB (ref 0.6–1.1)
LV TDI E'LATERAL: 15.2
LV TDI E'MEDIAL: 11.6
LV e' LATERAL: 15.2 cm/s
LVEEAVG: 5.3
LVEEMED: 5.3
LVOT VTI: 21.7 cm
LVOT area: 2.84 cm2
LVOT diameter: 19 mm
LVOTPV: 93.3 cm/s
LVOTSV: 62 mL
MV Dec: 173
MV pk A vel: 49.7 m/s
MVPG: 3 mmHg
MVPKEVEL: 80.6 m/s
RV LATERAL S' VELOCITY: 13.2 cm/s
RV TAPSE: 20.7 mm
RV sys press: 23 mmHg
TR max vel: 222 cm/s

## 2017-01-05 NOTE — Progress Notes (Signed)
  Echocardiogram 2D Echocardiogram has been performed.  Michele Alvarez 01/05/2017, 8:59 AM

## 2017-01-05 NOTE — Patient Instructions (Signed)
Follow up as needed

## 2017-01-07 NOTE — Progress Notes (Signed)
Patient ID: Michele Alvarez, female   DOB: 1961-02-26, 56 y.o.   MRN: [id number] Oncologist: Dr. Jana Hakim  56 yo with minimal past medical history was diagnosed with breast cancer in 2/17.  ER+/PR+/HER2-.  She had neoadjuvant chemo with doxorubicin/cyclophosphamide x 4 cycles then paclitaxel weekly x 12 cycles.  This was followed by definitive surgery and radiation.  She has completed doxorubicin + cyclophosphamide.   She does not have any exertional dyspnea or chest pain. No palpitations.    I reviewed the echo done today: no significant change compared to prior.   PMH: 1. Back pain. 2. Breast cancer: Diagnosed 2/17.  ER+/PR+/HER2-.  She had neoadjuvant chemo with doxorubicin/cyclophosphamide x 4 cycles then paclitaxel weekly x 12 cycles.  This was followed by definitive surgery and radiation.  - Echo (3/17) with EF 55-60%, lateral s' 9.6, GLS -36.6%, normal diastolic function, normal RV size and systolic function.  - Echo (4/17) with EF 29-47%, normal diastolic function, lateral s' 13.3, GLS -19.7%, normal RV size and systolic function, mild MR.  - Echo (6/18) with EF 55%, GLS -19.5%.   SH: Lives in [place], nonsmoker.    FH: No family history of heart problems.   ROS: All systems reviewed and negative except as per HPI.   Current Outpatient Prescriptions  Medication Sig Dispense Refill  . Multiple Vitamin (MULTIVITAMIN) tablet Take 1 tablet by mouth daily.    Marland Kitchen anastrozole (ARIMIDEX) 1 MG tablet Take 1 tablet (1 mg total) by mouth daily. 90 tablet 3   No current facility-administered medications for this encounter.    BP 116/69   Pulse 64   Ht _0  (1.727 m)   Wt 163 lb 8 oz (74.2 kg)   SpO2 98%   BMI 24.86 kg/m  General: NAD Neck: No JVD, no thyromegaly or thyroid nodule.  Lungs: Clear to auscultation bilaterally with normal respiratory effort. CV: Nondisplaced PMI.  Heart regular S1/S2, no S3/S4, no murmur.  No peripheral edema.  No carotid bruit.  Normal pedal  pulses.  Abdomen: Soft, nontender, no hepatosplenomegaly, no distention.  Skin: Intact without lesions or rashes.  Neurologic: Alert and oriented x 3.  Psych: Normal affect. Extremities: No clubbing or cyanosis.  HEENT: Normal.   Assessment/Plan: Ms Murad completed 4 cycles of doxorubicin/cyclophosphamide last year.  She returns for echo to assess for late changes related to doxorubicin.  Today's echo was reviewed and was stable with no change from prior.  She may return prn to this office.   Loralie Champagne 01/07/2017

## 2017-01-08 ENCOUNTER — Other Ambulatory Visit: Payer: Self-pay | Admitting: *Deleted

## 2017-01-08 DIAGNOSIS — C50511 Malignant neoplasm of lower-outer quadrant of right female breast: Secondary | ICD-10-CM

## 2017-01-08 DIAGNOSIS — Z170 Estrogen receptor positive status [ER+]: Principal | ICD-10-CM

## 2017-01-09 ENCOUNTER — Ambulatory Visit (HOSPITAL_BASED_OUTPATIENT_CLINIC_OR_DEPARTMENT_OTHER): Payer: 59 | Admitting: Oncology

## 2017-01-09 ENCOUNTER — Encounter: Payer: Self-pay | Admitting: *Deleted

## 2017-01-09 ENCOUNTER — Other Ambulatory Visit (HOSPITAL_BASED_OUTPATIENT_CLINIC_OR_DEPARTMENT_OTHER): Payer: 59

## 2017-01-09 VITALS — BP 116/69 | HR 74 | Temp 97.7°F | Resp 18 | Ht 68.0 in | Wt 163.4 lb

## 2017-01-09 DIAGNOSIS — C50511 Malignant neoplasm of lower-outer quadrant of right female breast: Secondary | ICD-10-CM

## 2017-01-09 DIAGNOSIS — Z170 Estrogen receptor positive status [ER+]: Secondary | ICD-10-CM

## 2017-01-09 DIAGNOSIS — M25529 Pain in unspecified elbow: Secondary | ICD-10-CM | POA: Diagnosis not present

## 2017-01-09 DIAGNOSIS — C773 Secondary and unspecified malignant neoplasm of axilla and upper limb lymph nodes: Secondary | ICD-10-CM | POA: Diagnosis not present

## 2017-01-09 DIAGNOSIS — M8580 Other specified disorders of bone density and structure, unspecified site: Secondary | ICD-10-CM | POA: Insufficient documentation

## 2017-01-09 DIAGNOSIS — M25569 Pain in unspecified knee: Secondary | ICD-10-CM

## 2017-01-09 LAB — CBC WITH DIFFERENTIAL/PLATELET
BASO%: 0.6 % (ref 0.0–2.0)
Basophils Absolute: 0 10*3/uL (ref 0.0–0.1)
EOS%: 1.5 % (ref 0.0–7.0)
Eosinophils Absolute: 0.1 10*3/uL (ref 0.0–0.5)
HCT: 40.4 % (ref 34.8–46.6)
HGB: 13.4 g/dL (ref 11.6–15.9)
LYMPH%: 28.5 % (ref 14.0–49.7)
MCH: 31.4 pg (ref 25.1–34.0)
MCHC: 33.2 g/dL (ref 31.5–36.0)
MCV: 94.5 fL (ref 79.5–101.0)
MONO#: 0.3 10*3/uL (ref 0.1–0.9)
MONO%: 7.7 % (ref 0.0–14.0)
NEUT#: 2.3 10*3/uL (ref 1.5–6.5)
NEUT%: 61.7 % (ref 38.4–76.8)
PLATELETS: 195 10*3/uL (ref 145–400)
RBC: 4.28 10*6/uL (ref 3.70–5.45)
RDW: 12.9 % (ref 11.2–14.5)
WBC: 3.7 10*3/uL — AB (ref 3.9–10.3)
lymph#: 1.1 10*3/uL (ref 0.9–3.3)

## 2017-01-09 LAB — COMPREHENSIVE METABOLIC PANEL
ALK PHOS: 92 U/L (ref 40–150)
ALT: 21 U/L (ref 0–55)
ANION GAP: 10 meq/L (ref 3–11)
AST: 23 U/L (ref 5–34)
Albumin: 4.4 g/dL (ref 3.5–5.0)
BUN: 14.9 mg/dL (ref 7.0–26.0)
CHLORIDE: 105 meq/L (ref 98–109)
CO2: 28 mEq/L (ref 22–29)
Calcium: 10.1 mg/dL (ref 8.4–10.4)
Creatinine: 0.9 mg/dL (ref 0.6–1.1)
EGFR: 76 mL/min/{1.73_m2} — AB (ref >90)
Glucose: 105 mg/dl (ref 70–140)
POTASSIUM: 4 meq/L (ref 3.5–5.1)
Sodium: 143 mEq/L (ref 136–145)
Total Bilirubin: 0.66 mg/dL (ref 0.20–1.20)
Total Protein: 7 g/dL (ref 6.4–8.3)

## 2017-01-09 MED ORDER — VITAMIN D 1000 UNITS PO TABS: 1000.0000 [IU] | ORAL_TABLET | Freq: Every day | ORAL | 3 refills | Status: DC

## 2017-01-09 NOTE — Progress Notes (Addendum)
Michele Alvarez  [phone] [phone]   ID: Michele Alvarez DOB: 17-Jan-1961  MR#: [id number]  FXJ#:[id number]  Patient Care Team: Jonathon Jordan, MD as PCP - General (Family Medicine) Avon Gully, NP as Nurse Practitioner (Obstetrics and Gynecology) [name], MD as Consulting Physician (General Surgery) Zylpha Poynor, Virgie Dad, MD as Consulting Physician (Oncology) Eppie Gibson, MD as Attending Physician (Radiation Oncology) Jovita Gamma, MD as Consulting Physician (Neurosurgery) Delice Bison, Charlestine Massed, NP as Nurse Practitioner (Hematology and Oncology) PCP: Jonathon Jordan, MD OTHER MD:  CHIEF COMPLAINT: Node-positive breast cancer  CURRENT TREATMENT: Anastrozole  BREAST CANCER HISTORY: From the original intake note:  "Michele Alvarez" had routine screening mammography at [facility] 08/07/2015. There was an area of calcification in the right breast 2:00 position. There was also an area of calcification in the left breast 7:00 position. She was recalled for bilateral diagnostic mammography 08/12/2015. The left breast calcifications were felt to be likely benign and repeat mammography in 6 months was suggested.  On the right, breast density was category D. There was a 1.2 cm irregular mass at the 6:00, inframammary position and also suspicious calcifications. Adding those together the area measured 2.5 cm. Ultrasound of the right breast on the same day found a 1.3 cm lobulated mass in the right breast at the 6:00 position. There was a 1.7 cm axillary lymph node with a fatty hilum.   On 08/30/2015 the patient underwent biopsy of the breast mass, the abnormal lymph node, and the area of calcifications. The calcifications ([id number]) were associated with ductal carcinoma in situ, with the prognostic panel pending. 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 The lymph node also was positive.  Note that the clip to the patient's breast mass did not deploy.  The patient's subsequent history is as detailed below  INTERVAL HISTORY: Michele Alvarez returns today for follow-up and treatment of her estrogen receptor positive breast cancer. She continues on anastrozole, generally with good tolerance. Hot flashes are "not bad". There are not constant. Vaginal dryness is not a major issue. She is not interested in participating in our intimacy and pelvic health program. She doesn't have arthralgias or myalgias although she is having a little bit of knee pain and some elbow pain.   REVIEW OF SYSTEMS: Michele Alvarez is doing walking and some leg exercises at the gym. She thinks she may have injured her knee by doing this. A detailed review of systems today was otherwise stable  PAST MEDICAL HISTORY: Past Medical History:  Diagnosis Date  . Breast cancer of lower-outer quadrant of right female breast ([place]) 08/19/2015  . History of radiation therapy 04/18/16- 05/30/16   Right Breast 50 Gy in 25 fractions. then boosted to 60 Gy in 5 fractions  . Hot flashes   . PONV (postoperative nausea and vomiting)     PAST SURGICAL HISTORY: Past Surgical History:  Procedure Laterality Date  . BACK SURGERY  2007, 2014   x2, lumbar surgery used same incision for both surgeries.   Marland Kitchen BREAST LUMPECTOMY WITH RADIOACTIVE SEED AND AXILLARY LYMPH NODE DISSECTION Right 03/08/2016   Procedure: RIGHT BREAST BRACKETED RADIOACTIVE SEED GUIDED LUMPECTOMY RIGHT AXILLARY SENTINEL LYMPH NODE BIOPSY AND RIGHT RADIOACTIVE SEED TARGETED AXILLARY NODE DISSECTION;  Surgeon: [name], MD;  Location: [place];  Service: General;  Laterality: Right;  . NECK SURGERY  2001  . PORT-A-CATH REMOVAL Right 03/08/2016  Procedure: REMOVAL PORT-A-CATH;  Surgeon: [name], MD;  Location: [place];  Service: General;  Laterality: Right;  . PORTACATH PLACEMENT  Right 09/09/2015   Procedure: INSERTION PORT-A-CATH WITH [place] ;  Surgeon: [name], MD;  Location: [place];  Service: General;  Laterality: Right;    FAMILY HISTORY No family history on file. The patient's father died in 2014-10-01 at the age of 64. He had significant COPD. The patient's mother died at the age of 71 from Parkinson's disease. The patient has 1 brother, 1 sister. There is no cancer history in the family to her knowledge.  GYNECOLOGIC HISTORY:  No LMP recorded. Patient is postmenopausal. Menarche age 70. She has GI asked P0. She stopped having periods in January 2014. She never took hormone replacement or oral contraceptives.  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 She lives with her partner, [name], and 2 cats.     ADVANCED DIRECTIVES: Not in place. At the 09/01/2015 clinic visit the patient was given the appropriate forms to complete and notarize at her discretion.   HEALTH MAINTENANCE: Social History  Substance Use Topics  . Smoking status: Never Smoker  . Smokeless tobacco: Never Used  . Alcohol use Yes     Comment: social, she drinks one alchohol drink daily.      Colonoscopy: Never   PAP:  Bone density: Never  Lipid panel:  Allergies  Allergen Reactions  . Latex Other (See Comments)    Fever blisters  . Skin Adhesives Dermatitis    Reaction to dermabond, do not use  Is below her leg. He is a little isn't strange people on overdose of this area I have a patient who refuses to hear her CA-125 results  Current Outpatient Prescriptions  Medication Sig Dispense Refill  . anastrozole (ARIMIDEX) 1 MG tablet Take 1 tablet (1 mg total) by mouth daily. 90 tablet 3  . Multiple Vitamin (MULTIVITAMIN) tablet Take 1 tablet by mouth daily.     No current facility-administered medications for this visit.     OBJECTIVE: Middle-aged white womanWho appears stated age   33:   01/09/17 1337  BP: 116/69  Pulse: 74  Resp:  18  Temp: 97.7 F (36.5 C)     Body mass index is 24.84 kg/m.    ECOG FS:1 - Symptomatic but completely ambulatory  Sclerae unicteric, EOMs intact Oropharynx clear and moist No cervical or supraclavicular adenopathy Lungs no rales or rhonchi Heart regular rate and rhythm Abd soft, nontender, positive bowel sounds MSK no focal spinal tenderness, no upper extremity lymphedema Neuro: nonfocal, well oriented, appropriate affect Breasts: The right breast is undergone lumpectomy followed by radiation with no evidence of local recurrence. The left breast is unremarkable. Both axillae are benign.   RESULTS:  CMP     Component Value Date/Time   NA 143 01/09/2017 1247   K 4.0 01/09/2017 1247   CO2 28 01/09/2017 1247   GLUCOSE 105 01/09/2017 1247   BUN 14.9 01/09/2017 1247   CREATININE 0.9 01/09/2017 1247   CALCIUM 10.1 01/09/2017 1247   PROT 7.0 01/09/2017 1247   ALBUMIN 4.4 01/09/2017 1247   AST 23 01/09/2017 1247   ALT 21 01/09/2017 1247   ALKPHOS 92 01/09/2017 1247   BILITOT 0.66 01/09/2017 1247    INo results found for: SPEP, UPEP  Lab Results  Component Value Date   WBC 3.7 (L) 01/09/2017   NEUTROABS 2.3 01/09/2017   HGB 13.4 01/09/2017  HCT 40.4 01/09/2017   MCV 94.5 01/09/2017   PLT 195 01/09/2017      Chemistry      Component Value Date/Time   NA 143 01/09/2017 1247   K 4.0 01/09/2017 1247   CO2 28 01/09/2017 1247   BUN 14.9 01/09/2017 1247   CREATININE 0.9 01/09/2017 1247      Component Value Date/Time   CALCIUM 10.1 01/09/2017 1247   ALKPHOS 92 01/09/2017 1247   AST 23 01/09/2017 1247   ALT 21 01/09/2017 1247   BILITOT 0.66 01/09/2017 1247       No results found for: LABCA2  No components found for: LABCA125  No results for input(s): INR in the last 168 hours.  Urinalysis No results found for: COLORURINE, APPEARANCEUR, LABSPEC, PHURINE, GLUCOSEU, HGBUR, BILIRUBINUR, KETONESUR, PROTEINUR, UROBILINOGEN, NITRITE, LEUKOCYTESUR  ELIGIBLE FOR  AVAILABLE RESEARCH PROTOCOL: no  STUDIES: Repeat mammography and a bone density study scheduled for February   ASSESSMENT: 56 y.o. [place] woman status post right breast lower outer quadrant and axillary lymph node biopsy 08/30/2015, both positive for a clinical T1-2 N1, stage II invasive ductal carcinoma, grade 3, estrogen and progesterone receptor positive, HER-2/neu nonamplified, with an MIB-1 of 80%  (1) neoadjuvant chemotherapy started 09/16/2015 consisting of doxorubicin and cyclophosphamide in dose dense fashion 4, completed 10/28/2015, followed by paclitaxel weekly 12  started 11/19/2015, completed 02/11/2016  (2) Status post right lumpectomy and sentinel lymph node sampling 03/08/2016 for a residual ypT1b ypN0 invasive ductal carcinoma, grade 2, with focally positive margins not requiring further excision  (3) adjuvant radiation 04/18/16 - 05/30/16 Site/dose:Total dose 60 Gy in 30 fx  (4) anastrozole started 07/17/2016  (a) bone density 08/25/2016 at [facility] showed a T score of -2.4  (5) participating in DCP-001 study   PLAN: This is now close to one year out from definitive surgery for her breast cancer with no evidence of disease recurrence. This is favorable.  She is tolerating anastrozole generally well. The plan is to continue that for a minimum of 5 years.  Her bone density is close to osteoporosis. She is already improving on her weightbearing schedule. I added vitamin D 1000 units today. We also discussed bisphosphonates and denosumab is options. We will discuss this further when she returns to see me 5 months from now. My recommendation is that she consider Prolia.  She knows to call for any other problems that may develop before the next visit.  Chauncey Cruel, MD   01/09/2017 1:46 PM9  Addendum: The bone density from [facility] obtained 08/25/2016 shows a T score of -2.4. We will discuss this further at her return visit.

## 2017-02-22 NOTE — Progress Notes (Signed)
CLINIC:  Survivorship   REASON FOR VISIT:  Routine follow-up post-treatment for a recent history of breast cancer.  BRIEF ONCOLOGIC HISTORY:    Malignant neoplasm of lower-outer quadrant of right breast of female, estrogen receptor positive ([place])   08/16/2015 Initial Biopsy    Right breast core biopsy (6:00): IDC, grade 3, ER+(90%), PR+(70%), Ki-67 80%, HER-2 negative (ratio 1.31). Lymph node also positive.      09/16/2015 - 10/28/2015 Neo-Adjuvant Chemotherapy    4 cycles of dose dense Doxorubicin and cyclophosphamide      11/19/2015 - 02/11/2016 Neo-Adjuvant Chemotherapy    12 cycles of weekly Paclitaxel.      03/08/2016 Surgery    Right breast lumpectomy Donne Hazel): IDC, grade 2, 0.7cm, DCIS, focally positive anterior margin (not requiring re-excision), 1 SLN neg for carcinoma.        04/18/2016 - 05/30/2016 Radiation Therapy    Adjuvant radiation Isidore Moos): Right breast: 50 Gy in 25 treatments, Right breast boost: 10Gy in 5 treatments.      07/17/2016 -  Anti-estrogen oral therapy    Anastrozole 51m daily, 5 years of therapy planned       INTERVAL HISTORY:  [facility] to review her survivorship care plan detailing her treatment course for breast cancer, as well as monitoring long-term side effects of that treatment, education regarding health maintenance, screening, and overall wellness and health promotion.     Overall, Michele Alvarez reports feeling quite well.  She is taking anastrozole daily.  With the exception of occasional hot flashes she si tolerating the Anastrozole with minimal difficulty.  She does have osteopenia, with a t score of -2.4.    REVIEW OF SYSTEMS:  Review of Systems  Constitutional: Negative for appetite change, chills, fatigue, fever and unexpected weight change.  HENT:   Negative for hearing loss and lump/mass.   Eyes: Negative for eye problems and icterus.  Respiratory: Negative  for chest tightness, cough and shortness of breath.   Cardiovascular: Negative for chest pain, leg swelling and palpitations.  Gastrointestinal: Negative for abdominal distention, abdominal pain, constipation, diarrhea, nausea and vomiting.  Endocrine: Positive for hot flashes.  Genitourinary: Negative for difficulty urinating and dyspareunia.   Musculoskeletal: Negative for arthralgias.  Skin: Negative for itching and rash.  Neurological: Negative for dizziness, extremity weakness, headaches and numbness.  Hematological: Negative for adenopathy. Does not bruise/bleed easily.  Psychiatric/Behavioral: Negative for depression. The patient is not nervous/anxious.   Breast: Denies any new nodularity, masses, tenderness, nipple changes, or nipple discharge.      ONCOLOGY TREATMENT TEAM:  1. Surgeon[facility]  [facility] 2. Medical Oncologist: Dr. MJana Hakim 3. Radiation Oncologist: Dr. SIsidore Moos   PAST MEDICAL/SURGICAL HISTORY:  Past Medical History:  Diagnosis Date  . Breast cancer of lower-outer quadrant of right female breast ([place] 08/19/2015  . History of radiation therapy 04/18/16- 05/30/16   Right Breast 50 Gy in 25 fractions. then boosted to 60 Gy in 5 fractions  . Hot flashes   . PONV (postoperative nausea and vomiting)    Past Surgical History:  Procedure Laterality Date  . BACK SURGERY  2007, 2014   x2, lumbar surgery used same incision for both surgeries.   .Marland KitchenBREAST LUMPECTOMY WITH RADIOACTIVE SEED AND AXILLARY LYMPH NODE DISSECTION Right 03/08/2016   Procedure: RIGHT BREAST BRACKETED RADIOACTIVE SEED GUIDED LUMPECTOMY RIGHT AXILLARY SENTINEL LYMPH NODE BIOPSY AND RIGHT RADIOACTIVE SEED TARGETED AXILLARY NODE DISSECTION;  Surgeon: [name], MD;  Location: [facility];  Service: General;  Laterality: Right;  . NECK SURGERY  2001  . PORT-A-CATH REMOVAL Right 03/08/2016   Procedure: REMOVAL PORT-A-CATH;  Surgeon: [name], MD;   Location: [facility];  Service: General;  Laterality: Right;  . PORTACATH PLACEMENT Right 09/09/2015   Procedure: INSERTION PORT-A-CATH WITH [place] ;  Surgeon: [name], MD;  Location: [facility];  Service: General;  Laterality: Right;     ALLERGIES:  Allergies  Allergen Reactions  . Latex Other (See Comments)    Fever blisters  . Skin Adhesives Dermatitis    Reaction to dermabond, do not use     CURRENT MEDICATIONS:  Outpatient Encounter Prescriptions as of 02/23/2017  Medication Sig  . anastrozole (ARIMIDEX) 1 MG tablet Take 1 tablet (1 mg total) by mouth daily.  . Multiple Vitamin (MULTIVITAMIN) tablet Take 1 tablet by mouth daily.  . cholecalciferol (VITAMIN D) 1000 units tablet Take 1 tablet (1,000 Units total) by mouth daily. (Patient not taking: Reported on 02/23/2017)   No facility-administered encounter medications on file as of 02/23/2017.      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 She lives with her partner, [name], and 2 cats. She denies any current or history of tobacco, alcohol, or illicit drug use.     PHYSICAL EXAMINATION:  Vital Signs:   Vitals:   02/23/17 1108  BP: 120/79  Pulse: 91  Resp: 18  Temp: 98.1 F (36.7 C)  SpO2: 100%   Filed Weights   02/23/17 1108  Weight: 163 lb 9.6 oz (74.2 kg)   General: Well-nourished, well-appearing female in no acute distress.  She is unaccompanied today.   HEENT: Head is normocephalic.  Pupils equal and reactive to light. Conjunctivae clear without exudate.  Sclerae anicteric. Oral mucosa is pink, moist.  Oropharynx is pink without lesions or erythema.  Lymph: No cervical, supraclavicular, or infraclavicular lymphadenopathy noted on palpation.  Cardiovascular: Regular rate and rhythm.Marland Kitchen Respiratory: Clear to auscultation bilaterally. Chest expansion symmetric; breathing non-labored.  GI: Abdomen soft and  round; non-tender, non-distended. Bowel sounds normoactive.  GU: Deferred.  Neuro: No focal deficits. Steady gait.  Psych: Mood and affect normal and appropriate for situation.  Extremities: No edema. MSK: No focal spinal tenderness to palpation.  Full range of motion in bilateral upper extremities Skin: Warm and dry.  LABORATORY DATA:  None for this visit.  DIAGNOSTIC IMAGING:  None for this visit.      ASSESSMENT AND PLAN:  Michele Alvarez is a pleasant 56 y.o. female with Stage IIB right breast invasive ductal carcinoma, ER+/PR+/HER2-, diagnosed in 07/2015, treated with neoadjuvant chemotherapy, lumpectomy, adjuvant radiation therapy, and anti-estrogen therapy with Anastrozole beginning in 07/2016.  She presents to the Survivorship Clinic for our initial meeting and routine follow-up post-completion of treatment for breast cancer.    1. Stage IIB right breast cancer:  Michele Alvarez is continuing to recover from definitive treatment for breast cancer. She will follow-up with her medical oncologist, Dr. Jana Hakim in 06/2017 with history and physical exam per surveillance protocol.  She will continue her anti-estrogen therapy with Anastrozole. Thus far, she is tolerating the Anastrozole well, with minimal side effects.  Today, a comprehensive survivorship care plan and treatment summary was reviewed with the patient today detailing her breast cancer diagnosis, treatment course, potential late/long-term effects of treatment, appropriate follow-up care with recommendations for the  future, and patient education resources.  A copy of this summary, along with a letter will be sent to the patient's primary care provider via mail/fax/In Basket message after today's visit.    2. Bone health:  Given Michele Alvarez's age/history of breast cancer, current osteopenia, and her current treatment regimen including anti-estrogen therapy with Anastrozole, she is at risk for further bone demineralization.  Her last  DEXA scan was in 12/2015, which showed osteopenia with a t score of -2.4. Dr. Jana Hakim has previously discussed Prolia with her.  She and I reviewed Prolia in detail.  I gave her handouts on Prolia and Xgeva.  She is going to see her dentist soon and plans on talking to them about this as well.  In the meantime, she was encouraged to increase her consumption of foods rich in calcium, as well as increase her weight-bearing activities.  She was given education on specific activities to promote bone health.  3. Cancer screening:  Due to Michele Alvarez's history and her age, she should receive screening for skin cancers, colon cancer, and gynecologic cancers.  The information and recommendations are listed on the patient's comprehensive care plan/treatment summary and were reviewed in detail with the patient.    4. Health maintenance and wellness promotion: Michele Alvarez was encouraged to consume 5-7 servings of fruits and vegetables per day. We reviewed the "Nutrition Rainbow" handout, as well as the handout "Take Control of Your Health and Reduce Your Cancer Risk" from the [facility].  She was also encouraged to engage in moderate to vigorous exercise for 30 minutes per day most days of the week. We discussed the [employer] fitness program, which is designed for cancer survivors to help them become more physically fit after cancer treatments.  She was instructed to limit her alcohol consumption and continue to abstain from tobacco use.     5. Support services/counseling: It is not uncommon for this period of the patient's cancer care trajectory to be one of many emotions and stressors.  We discussed an opportunity for her to participate in the next session of [facility] ("Finding Your New Normal") support group series designed for patients after they have completed treatment.   Michele Alvarez was encouraged to take advantage of our many other support services programs, support groups, and/or counseling  in coping with her new life as a cancer survivor after completing anti-cancer treatment.  She was offered support today through active listening and expressive supportive counseling.  She was given information regarding our available services and encouraged to contact me with any questions or for help enrolling in any of our support group/programs.    Dispo:   -Return to cancer center for follow up with Dr. Jana Hakim in 06/2017  -Mammogram due in 08/2017 -Bone Density due in 2020 -She is welcome to return back to the Survivorship Clinic at any time; no additional follow-up needed at this time.  -Consider referral back to survivorship as a long-term survivor for continued surveillance  A total of (50) minutes of face-to-face time was spent with this patient with greater than 50% of that time in counseling and care-coordination.   Gardenia Phlegm, NP Survivorship Program [place] [phone]   Note: PRIMARY CARE PROVIDER Jonathon Jordan, [place] [phone]

## 2017-02-23 ENCOUNTER — Ambulatory Visit (HOSPITAL_BASED_OUTPATIENT_CLINIC_OR_DEPARTMENT_OTHER): Payer: 59 | Admitting: Adult Health

## 2017-02-23 ENCOUNTER — Encounter: Payer: Self-pay | Admitting: Adult Health

## 2017-02-23 VITALS — BP 120/79 | HR 91 | Temp 98.1°F | Resp 18 | Ht 68.0 in | Wt 163.6 lb

## 2017-02-23 DIAGNOSIS — M8580 Other specified disorders of bone density and structure, unspecified site: Secondary | ICD-10-CM

## 2017-02-23 DIAGNOSIS — N951 Menopausal and female climacteric states: Secondary | ICD-10-CM | POA: Diagnosis not present

## 2017-02-23 DIAGNOSIS — Z170 Estrogen receptor positive status [ER+]: Secondary | ICD-10-CM | POA: Diagnosis not present

## 2017-02-23 DIAGNOSIS — C50511 Malignant neoplasm of lower-outer quadrant of right female breast: Secondary | ICD-10-CM | POA: Diagnosis not present

## 2017-02-23 NOTE — Patient Instructions (Signed)
Alendronate tablets What is this medicine? ALENDRONATE (a LEN droe nate) slows calcium loss from bones. It helps to make normal healthy bone and to slow bone loss in people with Paget's disease and osteoporosis. It may be used in others at risk for bone loss. This medicine may be used for other purposes; ask your health care provider or pharmacist if you have questions. COMMON BRAND NAME(S): Fosamax What should I tell my health care provider before I take this medicine? They need to know if you have any of these conditions: -dental disease -esophagus, stomach, or intestine problems, like acid reflux or GERD -kidney disease -low blood calcium -low vitamin D -problems sitting or standing 30 minutes -trouble swallowing -an unusual or allergic reaction to alendronate, other medicines, foods, dyes, or preservatives -pregnant or trying to get pregnant -breast-feeding How should I use this medicine? You must take this medicine exactly as directed or you will lower the amount of the medicine you absorb into your body or you may cause yourself harm. Take this medicine by mouth first thing in the morning, after you are up for the day. Do not eat or drink anything before you take your medicine. Swallow the tablet with a full glass (6 to 8 fluid ounces) of plain water. Do not take this medicine with any other drink. Do not chew or crush the tablet. After taking this medicine, do not eat breakfast, drink, or take any medicines or vitamins for at least 30 minutes. Sit or stand up for at least 30 minutes after you take this medicine; do not lie down. Do not take your medicine more often than directed. Talk to your pediatrician regarding the use of this medicine in children. Special care may be needed. Overdosage: If you think you have taken too much of this medicine contact a poison control center or emergency room at once. NOTE: This medicine is only for you. Do not share this medicine with others. What if I  miss a dose? If you miss a dose, do not take it later in the day. Continue your normal schedule starting the next morning. Do not take double or extra doses. What may interact with this medicine? -aluminum hydroxide -antacids -aspirin -calcium supplements -drugs for inflammation like ibuprofen, naproxen, and others -iron supplements -magnesium supplements -vitamins with minerals This list may not describe all possible interactions. Give your health care provider a list of all the medicines, herbs, non-prescription drugs, or dietary supplements you use. Also tell them if you smoke, drink alcohol, or use illegal drugs. Some items may interact with your medicine. What should I watch for while using this medicine? Visit your doctor or health care professional for regular checks ups. It may be some time before you see benefit from this medicine. Do not stop taking your medicine except on your doctor's advice. Your doctor or health care professional may order blood tests and other tests to see how you are doing. You should make sure you get enough calcium and vitamin D while you are taking this medicine, unless your doctor tells you not to. Discuss the foods you eat and the vitamins you take with your health care professional. Some people who take this medicine have severe bone, joint, and/or muscle pain. This medicine may also increase your risk for a broken thigh bone. Tell your doctor right away if you have pain in your upper leg or groin. Tell your doctor if you have any pain that does not go away or that gets worse.  This medicine can make you more sensitive to the sun. If you get a rash while taking this medicine, sunlight may cause the rash to get worse. Keep out of the sun. If you cannot avoid being in the sun, wear protective clothing and use sunscreen. Do not use sun lamps or tanning beds/booths. What side effects may I notice from receiving this medicine? Side effects that you should report to  your doctor or health care professional as soon as possible: -allergic reactions like skin rash, itching or hives, swelling of the face, lips, or tongue -black or tarry stools -bone, muscle or joint pain -changes in vision -chest pain -heartburn or stomach pain -jaw pain, especially after dental work -pain or trouble when swallowing -redness, blistering, peeling or loosening of the skin, including inside the mouth Side effects that usually do not require medical attention (report to your doctor or health care professional if they continue or are bothersome): -changes in taste -diarrhea or constipation -eye pain or itching -headache -nausea or vomiting -stomach gas or fullness This list may not describe all possible side effects. Call your doctor for medical advice about side effects. You may report side effects to FDA at 1-800-FDA-1088. Where should I keep my medicine? Keep out of the reach of children. Store at room temperature of 15 and 30 degrees C (59 and 86 degrees F). Throw away any unused medicine after the expiration date. NOTE: This sheet is a summary. It may not cover all possible information. If you have questions about this medicine, talk to your doctor, pharmacist, or health care provider.  2018 Elsevier/Gold Standard (2010-12-30 08:56:09) Denosumab injection What is this medicine? DENOSUMAB (den oh sue mab) slows bone breakdown. Prolia is used to treat osteoporosis in women after menopause and in men. Delton See is used to treat a high calcium level due to cancer and to prevent bone fractures and other bone problems caused by multiple myeloma or cancer bone metastases. Delton See is also used to treat giant cell tumor of the bone. This medicine may be used for other purposes; ask your health care provider or pharmacist if you have questions. COMMON BRAND NAME(S): Prolia, XGEVA What should I tell my health care provider before I take this medicine? They need to know if you have any  of these conditions: -dental disease -having surgery or tooth extraction -infection -kidney disease -low levels of calcium or Vitamin D in the blood -malnutrition -on hemodialysis -skin conditions or sensitivity -thyroid or parathyroid disease -an unusual reaction to denosumab, other medicines, foods, dyes, or preservatives -pregnant or trying to get pregnant -breast-feeding How should I use this medicine? This medicine is for injection under the skin. It is given by a health care professional in a hospital or clinic setting. If you are getting Prolia, a special MedGuide will be given to you by the pharmacist with each prescription and refill. Be sure to read this information carefully each time. For Prolia, talk to your pediatrician regarding the use of this medicine in children. Special care may be needed. For Delton See, talk to your pediatrician regarding the use of this medicine in children. While this drug may be prescribed for children as young as 13 years for selected conditions, precautions do apply. Overdosage: If you think you have taken too much of this medicine contact a poison control center or emergency room at once. NOTE: This medicine is only for you. Do not share this medicine with others. What if I miss a dose? It is important not  to miss your dose. Call your doctor or health care professional if you are unable to keep an appointment. What may interact with this medicine? Do not take this medicine with any of the following medications: -other medicines containing denosumab This medicine may also interact with the following medications: -medicines that lower your chance of fighting infection -steroid medicines like prednisone or cortisone This list may not describe all possible interactions. Give your health care provider a list of all the medicines, herbs, non-prescription drugs, or dietary supplements you use. Also tell them if you smoke, drink alcohol, or use illegal drugs.  Some items may interact with your medicine. What should I watch for while using this medicine? Visit your doctor or health care professional for regular checks on your progress. Your doctor or health care professional may order blood tests and other tests to see how you are doing. Call your doctor or health care professional for advice if you get a fever, chills or sore throat, or other symptoms of a cold or flu. Do not treat yourself. This drug may decrease your body's ability to fight infection. Try to avoid being around people who are sick. You should make sure you get enough calcium and vitamin D while you are taking this medicine, unless your doctor tells you not to. Discuss the foods you eat and the vitamins you take with your health care professional. See your dentist regularly. Brush and floss your teeth as directed. Before you have any dental work done, tell your dentist you are receiving this medicine. Do not become pregnant while taking this medicine or for 5 months after stopping it. Talk with your doctor or health care professional about your birth control options while taking this medicine. Women should inform their doctor if they wish to become pregnant or think they might be pregnant. There is a potential for serious side effects to an unborn child. Talk to your health care professional or pharmacist for more information. What side effects may I notice from receiving this medicine? Side effects that you should report to your doctor or health care professional as soon as possible: -allergic reactions like skin rash, itching or hives, swelling of the face, lips, or tongue -bone pain -breathing problems -dizziness -jaw pain, especially after dental work -redness, blistering, peeling of the skin -signs and symptoms of infection like fever or chills; cough; sore throat; pain or trouble passing urine -signs of low calcium like fast heartbeat, muscle cramps or muscle pain; pain, tingling,  numbness in the hands or feet; seizures -unusual bleeding or bruising -unusually weak or tired Side effects that usually do not require medical attention (report to your doctor or health care professional if they continue or are bothersome): -constipation -diarrhea -headache -joint pain -loss of appetite -muscle pain -runny nose -tiredness -upset stomach This list may not describe all possible side effects. Call your doctor for medical advice about side effects. You may report side effects to FDA at 1-800-FDA-1088. Where should I keep my medicine? This medicine is only given in a clinic, doctor's office, or other health care setting and will not be stored at home. NOTE: This sheet is a summary. It may not cover all possible information. If you have questions about this medicine, talk to your doctor, pharmacist, or health care provider.  2018 Elsevier/Gold Standard (2016-07-25 19:17:21)

## 2017-04-04 IMAGING — MR MR BREAST BILAT WO/W CM
8 of 12 series · 33 of 48 positions shown · IV contrast (multihance)
Comparison: Previous exam(s).

CLINICAL DATA: Recently diagnosed right breast cancer. Patient is
at the mid point of chemotherapy, assess for chemotherapy response.

LABS:  Does not apply
EXAM:
BILATERAL BREAST MRI WITH AND WITHOUT CONTRAST
TECHNIQUE: Multiplanar, multisequence MR images of both breasts were obtained
prior to and following the intravenous administration of 15 ml of
MultiHance.

[Series 2: t2_tirm_tra ipat (a-p) · axial · 3.0mm · 0.70mm/px · 1 of 55 slices shown]
[im 1/55]
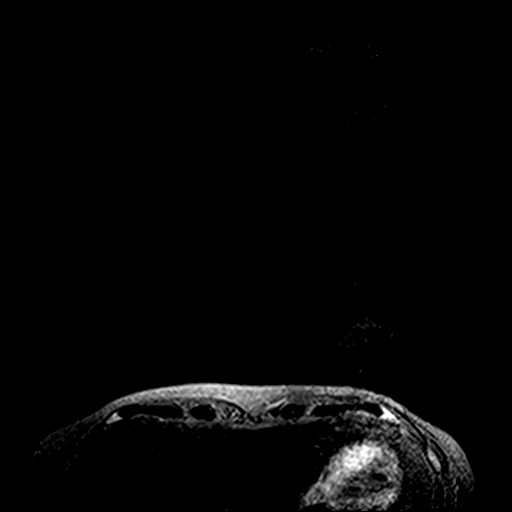

[Series 3: fl3d pre-cm no · axial · non-contrast · 1.2mm · 0.94mm/px · z∈[-90,+82]mm · 5 of 144 slices shown]
[im 1/144]
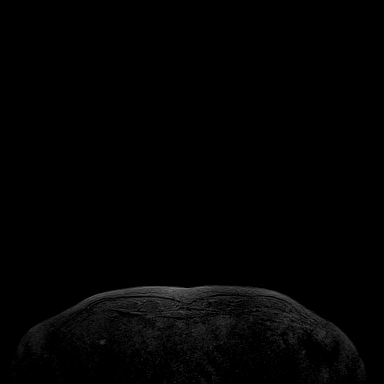
[im 36/144]
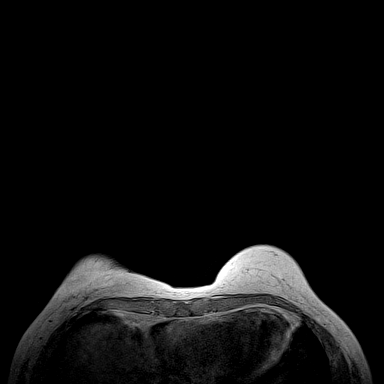
[im 72/144]
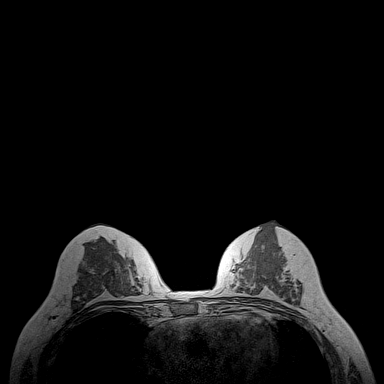
[im 108/144]
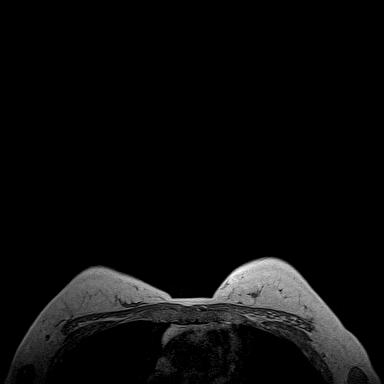
[im 144/144]
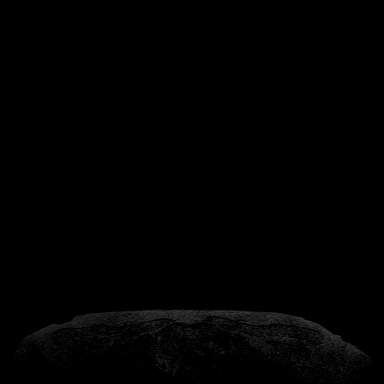

[Series 4: fl3d pre-cm · axial · non-contrast · 1.2mm · 0.94mm/px · z∈[-90,+82]mm · 5 of 144 slices shown]
[im 1/144]
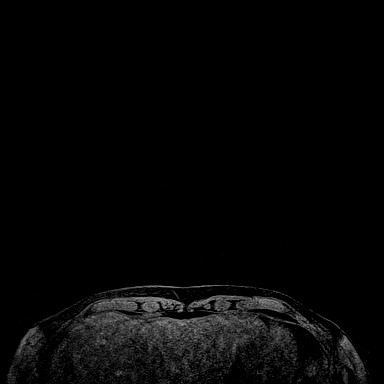
[im 36/144]
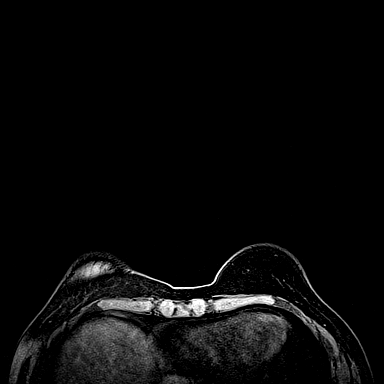
[im 72/144]
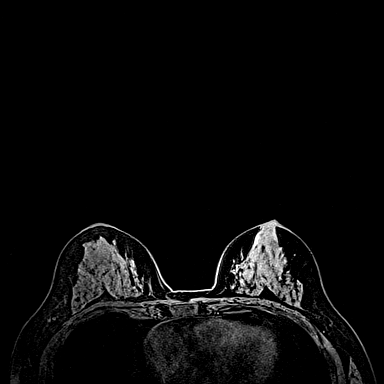
[im 108/144]
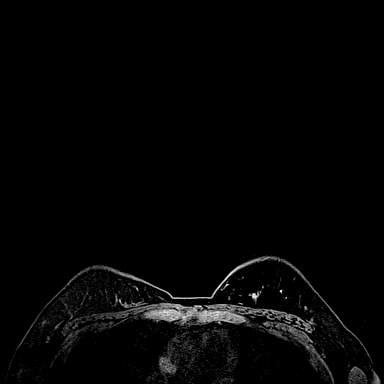
[im 144/144]
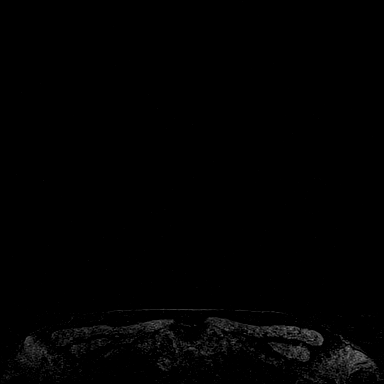

[Series 5: fl3d post-cm 20 · axial · 1.2mm · 0.94mm/px · z∈[-90,+82]mm · 5 of 144 slices shown (1 of 3)]
[im 1/144]
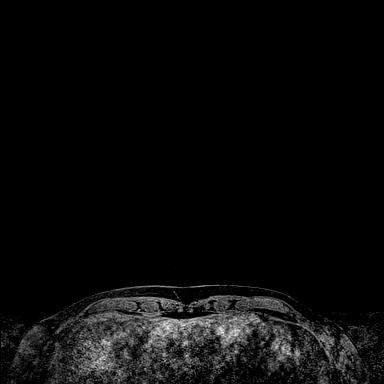
[im 36/144]
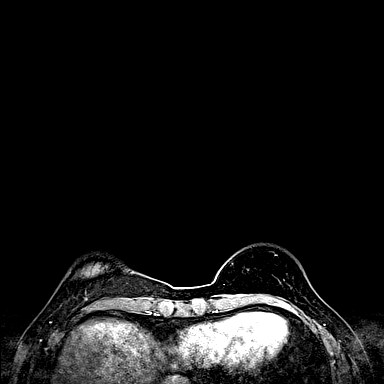
[im 72/144]
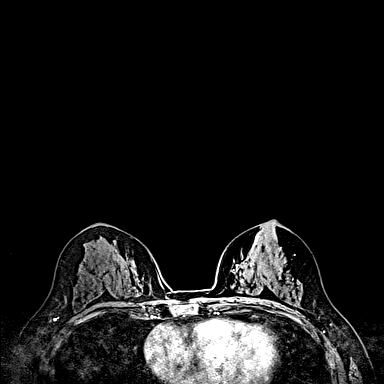
[im 108/144]
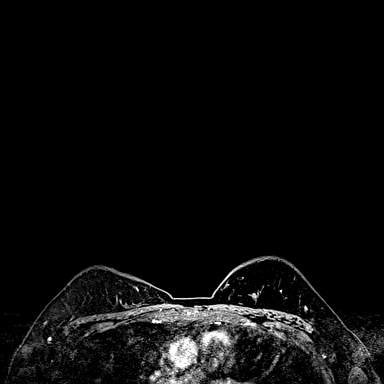
[im 144/144]
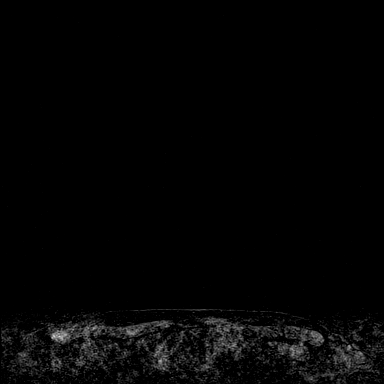

[Series 6: fl3d post-cm 20 · axial · 1.2mm · 0.94mm/px · z∈[-90,+82]mm · 5 of 144 slices shown (2 of 3)]
[im 1/144]
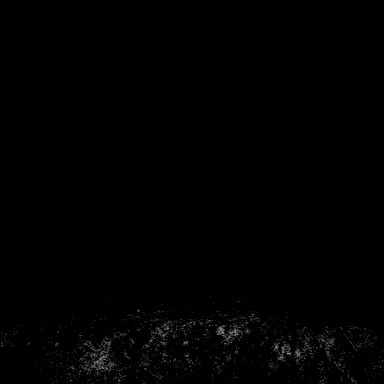
[im 36/144]
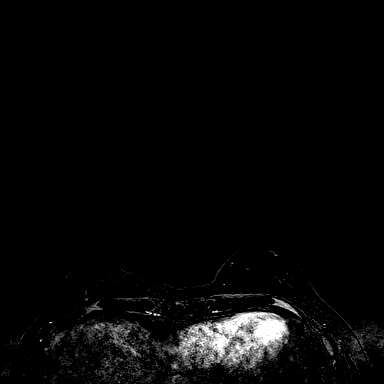
[im 72/144]
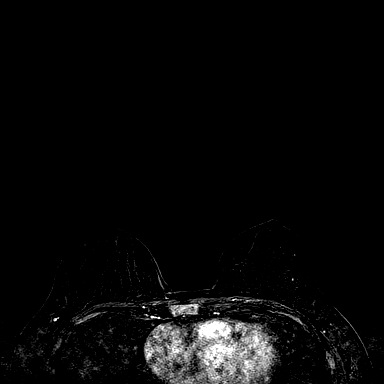
[im 108/144]
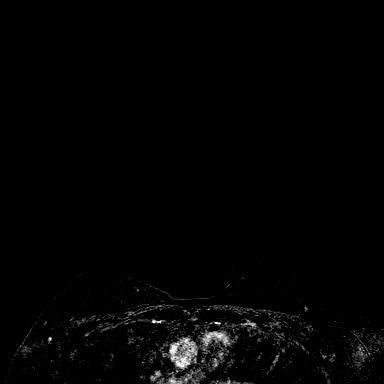
[im 144/144]
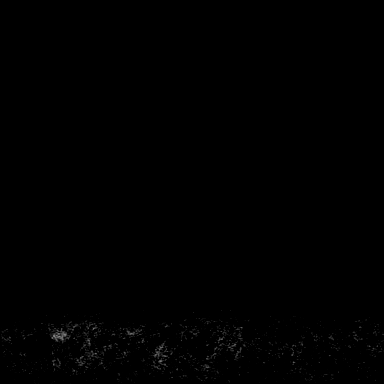

[Series 7: fl3d post-cm 20 · axial · 172.8mm · 0.94mm/px · 1 of 1 slices shown (3 of 3)]
[im 1/1]
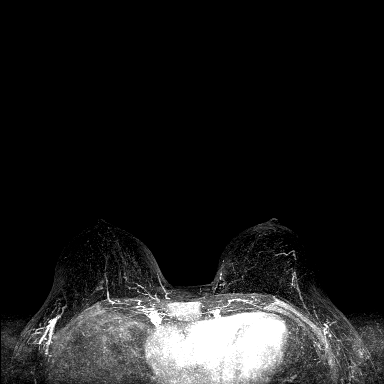

[Series 8: fl3d post-cm 3min · axial · 1.2mm · 0.94mm/px · z∈[-90,+82]mm · 6 of 144 slices shown]
[im 1/144]
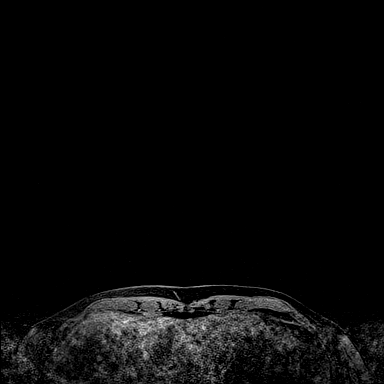
[im 29/144]
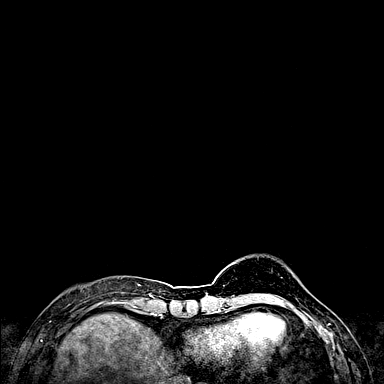
[im 58/144]
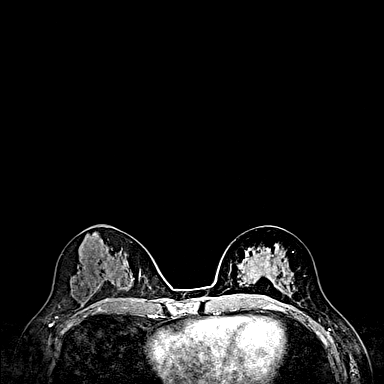
[im 86/144]
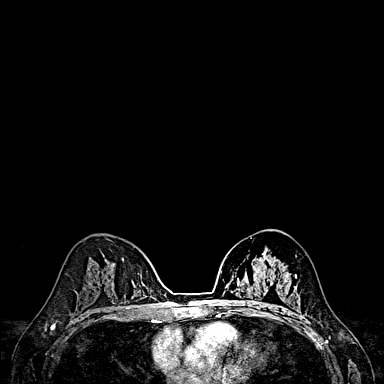
[im 115/144]
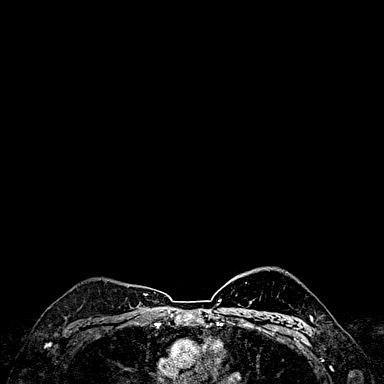
[im 144/144]
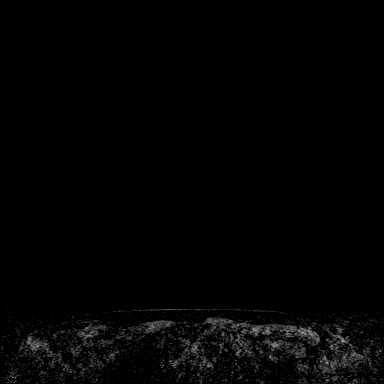

[Series 9: fl3d post-cm 3min_sub · axial · 1.2mm · 0.94mm/px · z∈[-90,+47]mm · 5 of 144 slices shown]
[im 1/144]
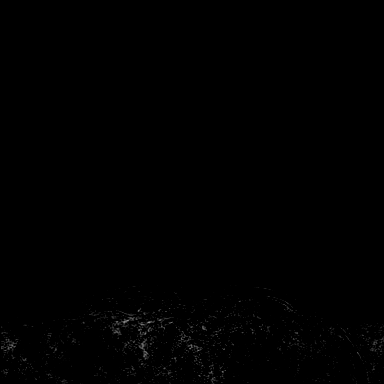
[im 29/144]
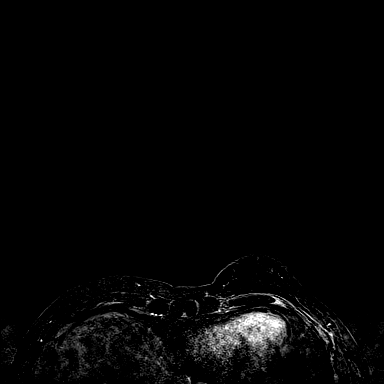
[im 58/144]
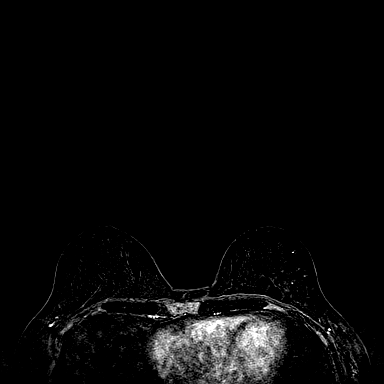
[im 86/144]
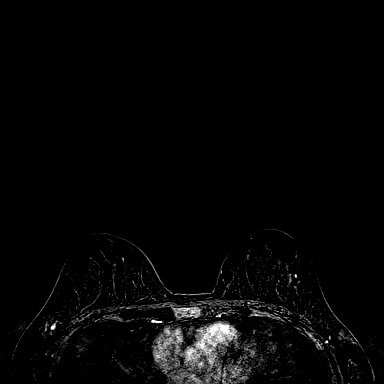
[im 115/144]
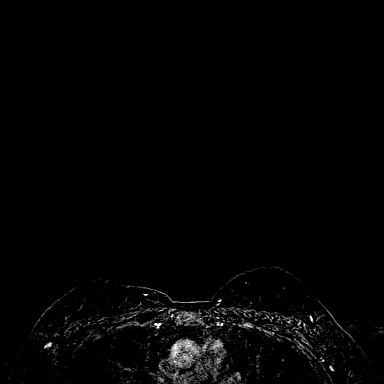

[33 of 48 positions shown; findings below may reference images not displayed]

THREE-DIMENSIONAL MR IMAGE RENDERING ON INDEPENDENT WORKSTATION:

Three-dimensional MR images were rendered by post-processing of the
original MR data on an independent workstation. The
three-dimensional MR images were interpreted, and findings are
reported in the following complete MRI report for this study. Three
dimensional images were evaluated at the independent [employer]
workstation
FINDINGS: Breast composition: d. Extreme fibroglandular tissue.

Background parenchymal enhancement: Mild

Right breast: The previously noted enhancing mass is significantly
smaller currently measuring 9 x 7 x 4 mm with plateau enhancement
kinetics, posterior depth 6 o'clock position.

Left breast: No mass or abnormal enhancement.

Lymph nodes: The previously described enlarged lymph nodes in the
right axilla are normal size. No abnormal appearing lymph nodes.

Ancillary findings:  None.
IMPRESSION: Known right breast cancer, mass is significantly smaller compared to
prior exam. Normal size right axillary lymph nodes on the current
exam.

RECOMMENDATION:
Treatment plan

BI-RADS CATEGORY  6: Known biopsy-proven malignancy.

## 2017-06-13 NOTE — Progress Notes (Signed)
[place]  Telephone:([phone]) [phone] Fax:(336) [phone]   ID: Michele Alvarez DOB: February 15, 1961  MR#: [id number]  ERD#:[id number]  Patient Care Team: Jonathon Jordan, MD as PCP - General (Family Medicine) Avon Gully, NP as Nurse Practitioner (Obstetrics and Gynecology) [name], MD as Consulting Physician (General Surgery) Hero Mccathern, Virgie Dad, MD as Consulting Physician (Oncology) Eppie Gibson, MD as Attending Physician (Radiation Oncology) Jovita Gamma, MD as Consulting Physician (Neurosurgery) Delice Bison, Charlestine Massed, NP as Nurse Practitioner (Hematology and Oncology) PCP: Jonathon Jordan, MD OTHER MD:  CHIEF COMPLAINT: Node-positive breast cancer  CURRENT TREATMENT: Anastrozole  BREAST CANCER HISTORY: From the original intake note:  "Michele Alvarez" had routine screening mammography at [facility] 08/07/2015. There was an area of calcification in the right breast 2:00 position. There was also an area of calcification in the left breast 7:00 position. She was recalled for bilateral diagnostic mammography 08/12/2015. The left breast calcifications were felt to be likely benign and repeat mammography in 6 months was suggested.  On the right, breast density was category D. There was a 1.2 cm irregular mass at the 6:00, inframammary position and also suspicious calcifications. Adding those together the area measured 2.5 cm. Ultrasound of the right breast on the same day found a 1.3 cm lobulated mass in the right breast at the 6:00 position. There was a 1.7 cm axillary lymph node with a fatty hilum.   On 08/30/2015 the patient underwent biopsy of the breast mass, the abnormal lymph node, and the area of calcifications. The calcifications ([id number]) were associated with ductal carcinoma in situ, with the prognostic panel pending. 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 The lymph node also was positive.  Note that the clip to the patient's breast mass did not deploy.  The patient's subsequent history is as detailed below  INTERVAL HISTORY: Michele Alvarez returns today for follow-up and treatment of her estrogen receptor positive breast cancer.  She continues on anastrozole, with good tolerance. She reports having mild hot flashes that are usually brief. She uses coconut oil for vaginal dryness.  REVIEW OF SYSTEMS: Michele Alvarez reports that she currently has a cold that she thought was a sinus infection, but it was viral. She reports that she was having headache and sore throat. She reports having a post nasal drip and some coughing. She ended up missing 2 days of work, but she reports that it is slowly getting better. She notes that this sinus infection seems to be drawn out. She has been walking a lot at work and has been very busy at work. She doesn't have a step counter, but she walks enough to where her feet hurt by the end of the day. She denies unusual headaches, visual changes, nausea, vomiting, or dizziness. There has been no unusual cough, phlegm production, or pleurisy. This been no change in bowel or bladder habits. She denies unexplained fatigue or unexplained weight loss, bleeding, rash, or fever. A detailed review of systems was otherwise stable.    PAST MEDICAL HISTORY: Past Medical History:  Diagnosis Date  . Breast cancer of lower-outer quadrant of right female breast ([place]) 08/19/2015  . History of radiation therapy 04/18/16- 05/30/16   Right Breast 50 Gy in 25 fractions. then boosted to 60 Gy in 5 fractions  . Hot flashes   . PONV (postoperative nausea and vomiting)     PAST SURGICAL HISTORY: Past Surgical History:  Procedure  Laterality Date  . BACK SURGERY  2007, 2014   x2, lumbar surgery used same incision for both surgeries.   Marland Kitchen BREAST LUMPECTOMY WITH RADIOACTIVE SEED AND AXILLARY LYMPH NODE DISSECTION Right 03/08/2016   Procedure: RIGHT  BREAST BRACKETED RADIOACTIVE SEED GUIDED LUMPECTOMY RIGHT AXILLARY SENTINEL LYMPH NODE BIOPSY AND RIGHT RADIOACTIVE SEED TARGETED AXILLARY NODE DISSECTION;  Surgeon: [name], MD;  Location: [place];  Service: General;  Laterality: Right;  . NECK SURGERY  1999/09/21  . PORT-A-CATH REMOVAL Right 03/08/2016   Procedure: REMOVAL PORT-A-CATH;  Surgeon: [name], MD;  Location: [place];  Service: General;  Laterality: Right;  . PORTACATH PLACEMENT Right 09/09/2015   Procedure: INSERTION PORT-A-CATH WITH [place] ;  Surgeon: [name], MD;  Location: [facility] [place];  Service: General;  Laterality: Right;    FAMILY HISTORY No family history on file. The patient's father died in 09-20-14 at the age of 58. He had significant COPD. The patient's mother died at the age of 8 from Parkinson's disease. The patient has 1 brother, 1 sister. There is no cancer history in the family to her knowledge.  GYNECOLOGIC HISTORY:  No LMP recorded. Patient is postmenopausal. Menarche age 66. She has GI asked P0. She stopped having periods in January 2014. She never took hormone replacement or oral contraceptives.  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 She lives with her partner, [name], and 2 cats.     ADVANCED DIRECTIVES: Not in place. At the 09/01/2015 clinic visit the patient was given the appropriate forms to complete and notarize at her discretion.   HEALTH MAINTENANCE: Social History   Tobacco Use  . Smoking status: Never Smoker  . Smokeless tobacco: Never Used  Substance Use Topics  . Alcohol use: Yes    Comment: social, she drinks one alchohol drink daily.   . Drug use: No     Colonoscopy: Never   PAP:  Bone density: Never  Lipid panel:  Allergies  Allergen Reactions  . Latex Other (See Comments)    Fever blisters  . Skin Adhesives Dermatitis    Reaction to dermabond, do not use  Is below her leg. He is a little  isn't strange people on overdose of this area I have a patient who refuses to hear her CA-125 results  Current Outpatient Medications  Medication Sig Dispense Refill  . anastrozole (ARIMIDEX) 1 MG tablet Take 1 tablet (1 mg total) by mouth daily. 90 tablet 3  . cholecalciferol (VITAMIN D) 1000 units tablet Take 1 tablet (1,000 Units total) by mouth daily. (Patient not taking: Reported on 02/23/2017) 100 tablet 3  . Multiple Vitamin (MULTIVITAMIN) tablet Take 1 tablet by mouth daily.     No current facility-administered medications for this visit.     OBJECTIVE: Middle-aged white woman in no acute distress   Vitals:   06/18/17 1504  BP: 114/80  Pulse: 98  Resp: 18  Temp: 98.6 F (37 C)  SpO2: 100%     Body mass index is 25.33 kg/m.    ECOG FS:1 - Symptomatic but completely ambulatory  Sclerae unicteric, pupils round and equal Oropharynx clear and moist No cervical or supraclavicular adenopathy Lungs no rales or rhonchi Heart regular rate and rhythm Abd soft, nontender, positive bowel sounds MSK no focal spinal tenderness, no upper extremity lymphedema Neuro: nonfocal, well oriented, appropriate affect Breasts: The right breast is status post lumpectomy followed by radiation.  There is no evidence of local recurrence.  The cosmetic result is good.  The left breast is benign.  Both axillae are benign.   RESULTS:  CMP     Component Value Date/Time   NA 142 06/18/2017 1441   K 4.0 06/18/2017 1441   CO2 28 06/18/2017 1441   GLUCOSE 106 06/18/2017 1441   BUN 12.8 06/18/2017 1441   CREATININE 0.9 06/18/2017 1441   CALCIUM 9.5 06/18/2017 1441   PROT 6.9 06/18/2017 1441   ALBUMIN 4.2 06/18/2017 1441   AST 22 06/18/2017 1441   ALT 21 06/18/2017 1441   ALKPHOS 85 06/18/2017 1441   BILITOT 0.34 06/18/2017 1441    INo results found for: SPEP, UPEP  Lab Results  Component Value Date   WBC 4.7 06/18/2017   NEUTROABS 3.0 06/18/2017   HGB 12.5 06/18/2017   HCT 38.1  06/18/2017   MCV 96.5 06/18/2017   PLT 222 06/18/2017      Chemistry      Component Value Date/Time   NA 142 06/18/2017 1441   K 4.0 06/18/2017 1441   CO2 28 06/18/2017 1441   BUN 12.8 06/18/2017 1441   CREATININE 0.9 06/18/2017 1441      Component Value Date/Time   CALCIUM 9.5 06/18/2017 1441   ALKPHOS 85 06/18/2017 1441   AST 22 06/18/2017 1441   ALT 21 06/18/2017 1441   BILITOT 0.34 06/18/2017 1441       No results found for: LABCA2  No components found for: LABCA125  No results for input(s): INR in the last 168 hours.  Urinalysis No results found for: COLORURINE, APPEARANCEUR, LABSPEC, PHURINE, GLUCOSEU, HGBUR, BILIRUBINUR, KETONESUR, PROTEINUR, UROBILINOGEN, NITRITE, LEUKOCYTESUR  ELIGIBLE FOR AVAILABLE RESEARCH PROTOCOL: no  STUDIES: Repeat mammography due January 2019   ASSESSMENT: 56 y.o. 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, with an MIB-1 of 80%  (1) neoadjuvant chemotherapy started 09/16/2015 consisting of doxorubicin and cyclophosphamide in dose dense fashion 4, completed 10/28/2015, followed by paclitaxel weekly 12  started 11/19/2015, completed 02/11/2016  (2) Status post right lumpectomy and sentinel lymph node sampling 03/08/2016 for a residual ypT1b ypN0 invasive ductal carcinoma, grade 2, with focally positive margins not requiring further excision  (3) adjuvant radiation 04/18/16 - 05/30/16 Site/dose:Total dose 60 Gy in 30 fx  (4) anastrozole started 07/17/2016  (a) bone density 08/25/2016 at [facility] showed a T score of -2.4  (5) participating in DCP-001 study   PLAN: Michele Alvarez is now a little over a year out from definitive surgery for her breast cancer with no evidence of disease recurrence.  This is very favorable.  She is  tolerating anastrozole generally well and the plan will be to continue that for a minimum of 5 years.  She will be due for repeat bone density February 2020.  I offered her a referral to the pelvic health program but she declines.  I think she did have an upper respiratory viral infection but it certainly seems to be becoming a bacterial sinusitis.  She has not had a fever but has significant purulent drainage and she has missed 2 days of work.  I am going to go ahead and prescribe a Z-Pak for her and hope this clears things up for her.  Otherwise she will see me again in 6 months.  She knows to call for any problems that may develop before that visit.  Vrinda Heckstall, Virgie Dad, MD  06/18/17 4:42 PM Medical Oncology and Hematology [facility] [address] [place], [place] [address] Tel. [phone]    Fax. [phone]  This document serves as a record of services personally performed by Lurline Del, MD. It was created on his behalf by Sheron Nightingale, a trained medical scribe. The creation of this record is based on the scribe's personal observations and the provider's statements to them.   I have reviewed the above documentation for accuracy and completeness, and I agree with the above.

## 2017-06-18 ENCOUNTER — Other Ambulatory Visit (HOSPITAL_BASED_OUTPATIENT_CLINIC_OR_DEPARTMENT_OTHER): Payer: 59

## 2017-06-18 ENCOUNTER — Ambulatory Visit (HOSPITAL_BASED_OUTPATIENT_CLINIC_OR_DEPARTMENT_OTHER): Payer: 59 | Admitting: Oncology

## 2017-06-18 VITALS — BP 114/80 | HR 98 | Temp 98.6°F | Resp 18 | Ht 68.0 in | Wt 166.6 lb

## 2017-06-18 DIAGNOSIS — C773 Secondary and unspecified malignant neoplasm of axilla and upper limb lymph nodes: Secondary | ICD-10-CM | POA: Diagnosis not present

## 2017-06-18 DIAGNOSIS — R0982 Postnasal drip: Secondary | ICD-10-CM

## 2017-06-18 DIAGNOSIS — R05 Cough: Secondary | ICD-10-CM | POA: Diagnosis not present

## 2017-06-18 DIAGNOSIS — Z170 Estrogen receptor positive status [ER+]: Principal | ICD-10-CM

## 2017-06-18 DIAGNOSIS — R070 Pain in throat: Secondary | ICD-10-CM | POA: Diagnosis not present

## 2017-06-18 DIAGNOSIS — N951 Menopausal and female climacteric states: Secondary | ICD-10-CM

## 2017-06-18 DIAGNOSIS — N898 Other specified noninflammatory disorders of vagina: Secondary | ICD-10-CM

## 2017-06-18 DIAGNOSIS — R51 Headache: Secondary | ICD-10-CM | POA: Diagnosis not present

## 2017-06-18 DIAGNOSIS — C50511 Malignant neoplasm of lower-outer quadrant of right female breast: Secondary | ICD-10-CM

## 2017-06-18 LAB — CBC WITH DIFFERENTIAL/PLATELET
BASO%: 0.4 % (ref 0.0–2.0)
BASOS ABS: 0 10*3/uL (ref 0.0–0.1)
EOS%: 2.2 % (ref 0.0–7.0)
Eosinophils Absolute: 0.1 10*3/uL (ref 0.0–0.5)
HEMATOCRIT: 38.1 % (ref 34.8–46.6)
HGB: 12.5 g/dL (ref 11.6–15.9)
LYMPH#: 1.2 10*3/uL (ref 0.9–3.3)
LYMPH%: 25.4 % (ref 14.0–49.7)
MCH: 31.6 pg (ref 25.1–34.0)
MCHC: 32.8 g/dL (ref 31.5–36.0)
MCV: 96.5 fL (ref 79.5–101.0)
MONO#: 0.4 10*3/uL (ref 0.1–0.9)
MONO%: 7.5 % (ref 0.0–14.0)
NEUT#: 3 10*3/uL (ref 1.5–6.5)
NEUT%: 64.5 % (ref 38.4–76.8)
PLATELETS: 222 10*3/uL (ref 145–400)
RBC: 3.95 10*6/uL (ref 3.70–5.45)
RDW: 12.6 % (ref 11.2–14.5)
WBC: 4.7 10*3/uL (ref 3.9–10.3)

## 2017-06-18 LAB — COMPREHENSIVE METABOLIC PANEL
ALBUMIN: 4.2 g/dL (ref 3.5–5.0)
ALK PHOS: 85 U/L (ref 40–150)
ALT: 21 U/L (ref 0–55)
ANION GAP: 9 meq/L (ref 3–11)
AST: 22 U/L (ref 5–34)
BUN: 12.8 mg/dL (ref 7.0–26.0)
CALCIUM: 9.5 mg/dL (ref 8.4–10.4)
CHLORIDE: 105 meq/L (ref 98–109)
CO2: 28 mEq/L (ref 22–29)
CREATININE: 0.9 mg/dL (ref 0.6–1.1)
EGFR: >60 mL/min/{1.73_m2} (ref >60)
Glucose: 106 mg/dl (ref 70–140)
POTASSIUM: 4 meq/L (ref 3.5–5.1)
Sodium: 142 mEq/L (ref 136–145)
Total Bilirubin: 0.34 mg/dL (ref 0.20–1.20)
Total Protein: 6.9 g/dL (ref 6.4–8.3)

## 2017-06-18 MED ORDER — AZITHROMYCIN 250 MG PO TABS: ORAL_TABLET | ORAL | 0 refills | Status: DC

## 2017-06-19 ENCOUNTER — Telehealth: Payer: Self-pay | Admitting: Oncology

## 2017-06-19 LAB — VITAMIN D 25 HYDROXY (VIT D DEFICIENCY, FRACTURES): Vitamin D, 25-Hydroxy: 34.9 ng/mL (ref 30.0–100.0)

## 2017-06-19 NOTE — Telephone Encounter (Signed)
Spoke to patient regarding upcoming May 2019 appointments.

## 2017-06-21 ENCOUNTER — Other Ambulatory Visit: Payer: Self-pay | Admitting: *Deleted

## 2017-06-21 DIAGNOSIS — Z170 Estrogen receptor positive status [ER+]: Principal | ICD-10-CM

## 2017-06-21 DIAGNOSIS — C50511 Malignant neoplasm of lower-outer quadrant of right female breast: Secondary | ICD-10-CM

## 2017-06-21 MED ORDER — ANASTROZOLE 1 MG PO TABS: 1.0000 mg | ORAL_TABLET | Freq: Every day | ORAL | 3 refills | Status: DC

## 2017-06-27 ENCOUNTER — Other Ambulatory Visit: Payer: Self-pay | Admitting: *Deleted

## 2017-07-24 ENCOUNTER — Other Ambulatory Visit: Payer: Self-pay | Admitting: Oncology

## 2017-08-29 ENCOUNTER — Ambulatory Visit (INDEPENDENT_AMBULATORY_CARE_PROVIDER_SITE_OTHER): Payer: Self-pay

## 2017-08-29 ENCOUNTER — Ambulatory Visit (INDEPENDENT_AMBULATORY_CARE_PROVIDER_SITE_OTHER): Payer: 59 | Admitting: Family

## 2017-08-29 VITALS — Ht 68.0 in | Wt 166.6 lb

## 2017-08-29 DIAGNOSIS — M25561 Pain in right knee: Secondary | ICD-10-CM

## 2017-08-29 DIAGNOSIS — G8929 Other chronic pain: Secondary | ICD-10-CM | POA: Diagnosis not present

## 2017-08-29 DIAGNOSIS — M25562 Pain in left knee: Secondary | ICD-10-CM

## 2017-08-30 NOTE — Progress Notes (Signed)
Office Visit Note   Patient: Michele Alvarez           Date of Birth: July 30, 1960           MRN: [id number] Visit Date: 08/29/2017              Requested by: Jonathon Jordan, MD [place], [place] [address] PCP: Jonathon Jordan, MD  Chief Complaint  Patient presents with  . Right Knee - Pain, Edema  . Left Knee - Pain, Edema      HPI: The patient is a 57 year old woman seen today for bilateral knee pain and swelling. This has been ongoing for months to years. Right worse than left. Complains of fullness posteriorly as well as some swelling to the anterior compartment on right worse than left. Pain worse with flexion and squatting. Has been trying to increase her activities through PT and the gym, feeling lots of pressure posteriorly, especially with squats.  Has tried a knee brace with minimal relief. Has tried tylenol and Ibu with minimal relief, prefers tylenol.   Does have history of back surgery with Dr. Sherwood Gambler, 2007 and 2014. 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    Has had osteopenia since chemo for breast cancer, has been trying to increase load bearing exercise for bone health.  Assessment & Plan: Visit Diagnoses:  1. Chronic pain of left knee   2. Chronic pain of right knee     Plan: discussed degenerative changes on xray. discussed quad and vmo strengthening. Activities with foot planted, avoiding squatting and flexion activities. Offered depomedrol injection, patient deferred at this time. Recommended antiinflammatories for pain. Follow up in office as needed.   Follow-Up Instructions: No Follow-up on file.   Right Knee Exam   Muscle Strength  The patient has normal right knee strength.  Tenderness  The patient is experiencing tenderness in the medial joint line.  Range of Motion  The patient has normal right knee ROM.  Tests  Varus: negative Valgus: negative  Other  Erythema: absent Swelling:  mild Effusion: effusion present   Left Knee Exam   Muscle Strength  The patient has normal left knee strength.  Tenderness  The patient is experiencing tenderness in the medial joint line.  Range of Motion  The patient has normal left knee ROM.  Tests  Varus: negative Valgus: negative  Other  Erythema: absent Swelling: mild Effusion: no effusion present      Patient is alert, oriented, no adenopathy, well-dressed, normal affect, normal respiratory effort.   Imaging: No results found. No images are attached to the encounter.  Labs: No results found for: HGBA1C, ESRSEDRATE, CRP, LABURIC, REPTSTATUS, GRAMSTAIN, CULT, LABORGA  @LABSALLVALUES (HGBA1)@  Body mass index is 25.33 kg/m.  Orders:  Orders Placed This Encounter  Procedures  . XR Knee 1-2 Views Left  . XR Knee 1-2 Views Right   No orders of the defined types were placed in this encounter.    Procedures: No procedures performed  Clinical Data: No additional findings.  ROS:  All other systems negative, except as noted in the HPI. Review of Systems  Constitutional: Negative for chills and fever.  Musculoskeletal: Positive for arthralgias and joint swelling.  Neurological: Negative for weakness.    Objective: Vital Signs: Ht 5\' 8"  (1.727 m)   Wt 166 lb 9.6 oz (75.6 kg)   BMI 25.33 kg/m   Specialty Comments:  No specialty comments available.  [place]  History: Patient Active Problem List   Diagnosis Date Noted  . Osteopenia determined by x-ray 01/09/2017  . Malignant neoplasm of lower-outer quadrant of right breast of female, estrogen receptor positive ([place]) 08/19/2015   Past Medical History:  Diagnosis Date  . Breast cancer of lower-outer quadrant of right female breast ([place]) 08/19/2015  . History of radiation therapy 04/18/16- 05/30/16   Right Breast 50 Gy in 25 fractions. then boosted to 60 Gy in 5 fractions  . Hot flashes   . PONV (postoperative nausea and vomiting)     No family  history on file.  Past Surgical History:  Procedure Laterality Date  . BACK SURGERY  2007, 2014   x2, lumbar surgery used same incision for both surgeries.   Marland Kitchen BREAST LUMPECTOMY WITH RADIOACTIVE SEED AND AXILLARY LYMPH NODE DISSECTION Right 03/08/2016   Procedure: RIGHT BREAST BRACKETED RADIOACTIVE SEED GUIDED LUMPECTOMY RIGHT AXILLARY SENTINEL LYMPH NODE BIOPSY AND RIGHT RADIOACTIVE SEED TARGETED AXILLARY NODE DISSECTION;  Surgeon: [name], MD;  Location: [place];  Service: General;  Laterality: Right;  . NECK SURGERY  2001  . PORT-A-CATH REMOVAL Right 03/08/2016   Procedure: REMOVAL PORT-A-CATH;  Surgeon: [name], MD;  Location: [place] [place];  Service: General;  Laterality: Right;  . PORTACATH PLACEMENT Right 09/09/2015   Procedure: INSERTION PORT-A-CATH WITH [place] ;  Surgeon: [name], MD;  Location: [place];  Service: General;  Laterality: Right;   Social History   Occupational History  . Not on file  Tobacco Use  . Smoking status: Never Smoker  . Smokeless tobacco: Never Used  Substance and Sexual Activity  . Alcohol use: Yes    Comment: social, she drinks one alchohol drink daily.   . Drug use: No  . Sexual activity: Not on file

## 2017-11-19 ENCOUNTER — Inpatient Hospital Stay (HOSPITAL_BASED_OUTPATIENT_CLINIC_OR_DEPARTMENT_OTHER): Payer: 59 | Admitting: Oncology

## 2017-11-19 ENCOUNTER — Telehealth: Payer: Self-pay | Admitting: Oncology

## 2017-11-19 ENCOUNTER — Inpatient Hospital Stay: Payer: 59 | Attending: Oncology

## 2017-11-19 VITALS — BP 113/74 | HR 79 | Temp 98.7°F | Resp 20 | Ht 68.0 in | Wt 169.1 lb

## 2017-11-19 DIAGNOSIS — M7121 Synovial cyst of popliteal space [Baker], right knee: Secondary | ICD-10-CM

## 2017-11-19 DIAGNOSIS — Z170 Estrogen receptor positive status [ER+]: Secondary | ICD-10-CM

## 2017-11-19 DIAGNOSIS — C50511 Malignant neoplasm of lower-outer quadrant of right female breast: Secondary | ICD-10-CM | POA: Insufficient documentation

## 2017-11-19 DIAGNOSIS — C773 Secondary and unspecified malignant neoplasm of axilla and upper limb lymph nodes: Secondary | ICD-10-CM | POA: Insufficient documentation

## 2017-11-19 DIAGNOSIS — M8580 Other specified disorders of bone density and structure, unspecified site: Secondary | ICD-10-CM

## 2017-11-19 DIAGNOSIS — M7122 Synovial cyst of popliteal space [Baker], left knee: Secondary | ICD-10-CM | POA: Diagnosis not present

## 2017-11-19 LAB — CBC WITH DIFFERENTIAL/PLATELET
Basophils Absolute: 0 10*3/uL (ref 0.0–0.1)
Basophils Relative: 1 %
Eosinophils Absolute: 0 10*3/uL (ref 0.0–0.5)
Eosinophils Relative: 1 %
HEMATOCRIT: 36.5 % (ref 34.8–46.6)
HEMOGLOBIN: 12.2 g/dL (ref 11.6–15.9)
LYMPHS ABS: 1.2 10*3/uL (ref 0.9–3.3)
Lymphocytes Relative: 28 %
MCH: 31.6 pg (ref 25.1–34.0)
MCHC: 33.5 g/dL (ref 31.5–36.0)
MCV: 94.4 fL (ref 79.5–101.0)
MONO ABS: 0.4 10*3/uL (ref 0.1–0.9)
MONOS PCT: 8 %
NEUTROS ABS: 2.7 10*3/uL (ref 1.5–6.5)
NEUTROS PCT: 62 %
Platelets: 191 10*3/uL (ref 145–400)
RBC: 3.86 MIL/uL (ref 3.70–5.45)
RDW: 13.1 % (ref 11.2–14.5)
WBC: 4.4 10*3/uL (ref 3.9–10.3)

## 2017-11-19 LAB — COMPREHENSIVE METABOLIC PANEL
ALBUMIN: 4.2 g/dL (ref 3.5–5.0)
ALT: 13 U/L (ref 0–55)
ANION GAP: 5 (ref 3–11)
AST: 18 U/L (ref 5–34)
Alkaline Phosphatase: 74 U/L (ref 40–150)
BUN: 18 mg/dL (ref 7–26)
CHLORIDE: 107 mmol/L (ref 98–109)
CO2: 30 mmol/L — AB (ref 22–29)
Calcium: 9.6 mg/dL (ref 8.4–10.4)
Creatinine, Ser: 0.85 mg/dL (ref 0.60–1.10)
GFR calc non Af Amer: >60 mL/min (ref >60)
GLUCOSE: 115 mg/dL (ref 70–140)
Potassium: 4 mmol/L (ref 3.5–5.1)
Sodium: 142 mmol/L (ref 136–145)
Total Bilirubin: 0.3 mg/dL (ref 0.2–1.2)
Total Protein: 6.5 g/dL (ref 6.4–8.3)

## 2017-11-19 NOTE — Progress Notes (Signed)
[place]  [phone] [phone]   ID: Michele Alvarez DOB: [birth year]  MR#: [id number]  SLH#:[id number]  Patient Care Team: Jonathon Jordan, MD as PCP - General (Family Medicine) Avon Gully, NP as Nurse Practitioner (Obstetrics and Gynecology) [name], MD as Consulting Physician (General Surgery) Yanice Maqueda, Virgie Dad, MD as Consulting Physician (Oncology) Eppie Gibson, MD as Attending Physician (Radiation Oncology) Jovita Gamma, MD as Consulting Physician (Neurosurgery) Delice Bison, Charlestine Massed, NP as Nurse Practitioner (Hematology and Oncology) PCP: Jonathon Jordan, MD OTHER MD:  CHIEF COMPLAINT: Node-positive breast cancer  CURRENT TREATMENT: Anastrozole  BREAST CANCER HISTORY: From the original intake note:  "Michele Alvarez" had routine screening mammography at [facility] 08/07/2015. There was an area of calcification in the right breast 2:00 position. There was also an area of calcification in the left breast 7:00 position. She was recalled for bilateral diagnostic mammography 08/12/2015. The left breast calcifications were felt to be likely benign and repeat mammography in 6 months was suggested.  On the right, breast density was category D. There was a 1.2 cm irregular mass at the 6:00, inframammary position and also suspicious calcifications. Adding those together the area measured 2.5 cm. Ultrasound of the right breast on the same day found a 1.3 cm lobulated mass in the right breast at the 6:00 position. There was a 1.7 cm axillary lymph node with a fatty hilum.   On 08/30/2015 the patient underwent biopsy of the breast mass, the abnormal lymph node, and the area of calcifications. The calcifications ([id number]) were associated with ductal carcinoma in situ, with the prognostic panel pending. 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 The lymph node also was positive.  Note that the clip to the patient's breast mass did not deploy.  The patient's subsequent history is as detailed below  INTERVAL HISTORY: Michele Alvarez returns today for follow-up and treatment of her estrogen receptor positive breast cancer.  She continues on anastrozole, with good tolerance. She may have an occasional hot flash She experiences some vaginal dryness, but she aids this with coconut oil as needed. She has arthralgias and myalgias in her elbows, wrists, knees, and back.  Since her last visit, she underwent diagnostic bilateral mammography with CAD and tomography on 08/28/2017 at [facility] showing: breast density category D. There was no evidence of malignancy.    REVIEW OF SYSTEMS: Michele Alvarez reports that she works for a [employer] in [employer]. She is active at work. 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 She has Baker's cysts in both of her knees. She also feels tendonitis in her right achilles. She tries to exercise her joints at night to alleviate the pain. She saw a PA under Dr. Sharol Given, but she notes that this was not entirely helpful. After discontinuing taking calcium, she notes an improvement in leg cramping.  She denies unusual headaches, visual changes, nausea, vomiting, or dizziness. There has been no unusual cough, phlegm production, or pleurisy. This been no change in bowel or bladder habits. She denies unexplained fatigue or unexplained weight loss, bleeding, rash, or fever. A detailed review of systems was otherwise stable.     PAST MEDICAL HISTORY: Past Medical History:  Diagnosis Date  . Breast cancer of lower-outer quadrant of right female breast ([place]) 08/19/2015  . History of radiation therapy 04/18/16- 05/30/16   Right Breast 50 Gy in 25 fractions. then boosted to  60 Gy in 5 fractions  . Hot flashes   . PONV (postoperative nausea and vomiting)     PAST SURGICAL HISTORY: Past Surgical History:    Procedure Laterality Date  . BACK SURGERY  2007, 2014   x2, lumbar surgery used same incision for both surgeries.   [facility] Kitchen BREAST LUMPECTOMY WITH RADIOACTIVE SEED AND AXILLARY LYMPH NODE DISSECTION Right 03/08/2016   Procedure: RIGHT BREAST BRACKETED RADIOACTIVE SEED GUIDED LUMPECTOMY RIGHT AXILLARY SENTINEL LYMPH NODE BIOPSY AND RIGHT RADIOACTIVE SEED TARGETED AXILLARY NODE DISSECTION;  Surgeon: [name], MD;  Location: [place];  Service: General;  Laterality: Right;  . NECK SURGERY  09/26/99  . PORT-A-CATH REMOVAL Right 03/08/2016   Procedure: REMOVAL PORT-A-CATH;  Surgeon: [name], MD;  Location: [place];  Service: General;  Laterality: Right;  . PORTACATH PLACEMENT Right 09/09/2015   Procedure: INSERTION PORT-A-CATH WITH [place] ;  Surgeon: [name], MD;  Location: [place];  Service: General;  Laterality: Right;    FAMILY HISTORY No family history on file. The patient's father died in 09/26/14 at the age of 25. He had significant COPD. The patient's mother died at the age of 48 from Parkinson's disease. The patient has 1 brother, 1 sister. There is no cancer history in the family to her knowledge.  GYNECOLOGIC HISTORY:  No LMP recorded. Patient is postmenopausal. Menarche age 37. She has GI asked P0. She stopped having periods in January 2014. She never took hormone replacement or oral contraceptives.  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 She lives with her partner, [name], and 2 cats.     ADVANCED DIRECTIVES: Not in place. At the 09/01/2015 clinic visit the patient was given the appropriate forms to complete and notarize at her discretion.   HEALTH MAINTENANCE: Social History   Tobacco Use  . Smoking status: Never Smoker  . Smokeless tobacco: Never Used  Substance Use Topics  . Alcohol use: Yes    Comment: social, she drinks one alchohol drink daily.   . Drug use: No     Colonoscopy: Never    PAP:  Bone density: Never  Lipid panel:  Allergies  Allergen Reactions  . Latex Other (See Comments)    Fever blisters  . Skin Adhesives Dermatitis    Reaction to dermabond, do not use  Is below her leg. He is a little isn't strange people on overdose of this area I have a patient who refuses to hear her CA-125 results  Current Outpatient Medications  Medication Sig Dispense Refill  . anastrozole (ARIMIDEX) 1 MG tablet Take 1 tablet (1 mg total) by mouth daily. 90 tablet 3  . azithromycin (ZITHROMAX) 250 MG tablet Take 2 tablets day 1, then one tablet a day 6 each 0  . cholecalciferol (VITAMIN D) 1000 units tablet Take 1 tablet (1,000 Units total) by mouth daily. (Patient not taking: Reported on 02/23/2017) 100 tablet 3  . Multiple Vitamin (MULTIVITAMIN) tablet Take 1 tablet by mouth daily.     No current facility-administered medications for this visit.     OBJECTIVE: Middle-aged white woman who appears well   Vitals:   11/19/17 1505  BP: 113/74  Pulse: 79  Resp: 20  Temp: 98.7 F (37.1 C)  SpO2: 99%     Body mass index is 25.71 kg/m.    ECOG FS:1 - Symptomatic but completely ambulatory  Sclerae unicteric, EOMs intact Oropharynx clear and moist No cervical or supraclavicular adenopathy Lungs no rales  or rhonchi Heart regular rate and rhythm Abd soft, nontender, positive bowel sounds MSK no focal spinal tenderness, no upper extremity lymphedema Neuro: nonfocal, well oriented, appropriate affect Breasts: The right breast has undergone lumpectomy followed by radiation.  There is no evidence of local recurrence.  The left breast is benign.  Both axillae are benign.   RESULTS:  CMP     Component Value Date/Time   NA 142 11/19/2017 1455   NA 142 06/18/2017 1441   K 4.0 11/19/2017 1455   K 4.0 06/18/2017 1441   CL 107 11/19/2017 1455   CO2 30 (H) 11/19/2017 1455   CO2 28 06/18/2017 1441   GLUCOSE 115 11/19/2017 1455   GLUCOSE 106 06/18/2017 1441   BUN 18  11/19/2017 1455   BUN 12.8 06/18/2017 1441   CREATININE 0.85 11/19/2017 1455   CREATININE 0.9 06/18/2017 1441   CALCIUM 9.6 11/19/2017 1455   CALCIUM 9.5 06/18/2017 1441   PROT 6.5 11/19/2017 1455   PROT 6.9 06/18/2017 1441   ALBUMIN 4.2 11/19/2017 1455   ALBUMIN 4.2 06/18/2017 1441   AST 18 11/19/2017 1455   AST 22 06/18/2017 1441   ALT 13 11/19/2017 1455   ALT 21 06/18/2017 1441   ALKPHOS 74 11/19/2017 1455   ALKPHOS 85 06/18/2017 1441   BILITOT 0.3 11/19/2017 1455   BILITOT 0.34 06/18/2017 1441   GFRNONAA >60 11/19/2017 1455   GFRAA >60 11/19/2017 1455    INo results found for: SPEP, UPEP  Lab Results  Component Value Date   WBC 4.4 11/19/2017   NEUTROABS 2.7 11/19/2017   HGB 12.2 11/19/2017   HCT 36.5 11/19/2017   MCV 94.4 11/19/2017   PLT 191 11/19/2017      Chemistry      Component Value Date/Time   NA 142 11/19/2017 1455   NA 142 06/18/2017 1441   K 4.0 11/19/2017 1455   K 4.0 06/18/2017 1441   CL 107 11/19/2017 1455   CO2 30 (H) 11/19/2017 1455   CO2 28 06/18/2017 1441   BUN 18 11/19/2017 1455   BUN 12.8 06/18/2017 1441   CREATININE 0.85 11/19/2017 1455   CREATININE 0.9 06/18/2017 1441      Component Value Date/Time   CALCIUM 9.6 11/19/2017 1455   CALCIUM 9.5 06/18/2017 1441   ALKPHOS 74 11/19/2017 1455   ALKPHOS 85 06/18/2017 1441   AST 18 11/19/2017 1455   AST 22 06/18/2017 1441   ALT 13 11/19/2017 1455   ALT 21 06/18/2017 1441   BILITOT 0.3 11/19/2017 1455   BILITOT 0.34 06/18/2017 1441       No results found for: LABCA2  No components found for: LABCA125  No results for input(s): INR in the last 168 hours.  Urinalysis No results found for: COLORURINE, APPEARANCEUR, LABSPEC, PHURINE, GLUCOSEU, HGBUR, BILIRUBINUR, KETONESUR, PROTEINUR, UROBILINOGEN, NITRITE, LEUKOCYTESUR  ELIGIBLE FOR AVAILABLE RESEARCH PROTOCOL: no  STUDIES: No results found.  Since her last visit, she underwent diagnostic bilateral mammography with CAD and  tomography on 08/28/2017 at [facility] showing: breast density category D. There was no evidence of malignancy.   ASSESSMENT: 57 y.o. 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, with an MIB-1 of 80%  (1) neoadjuvant chemotherapy started 09/16/2015 consisting of doxorubicin and cyclophosphamide in dose dense fashion 4, completed 10/28/2015, followed by paclitaxel weekly 12  started 11/19/2015,  completed 02/11/2016  (2) Status post right lumpectomy and sentinel lymph node sampling 03/08/2016 for a residual ypT1b ypN0 invasive ductal carcinoma, grade 2, with focally positive margins not requiring further excision  (3) adjuvant radiation 04/18/16 - 05/30/16 Site/dose:Total dose 60 Gy in 30 fx  (4) anastrozole started 07/17/2016  (a) bone density 08/25/2016 at [facility] showed a T score of -2.4  (5) participating in DCP-001 study   PLAN: Michele Alvarez is now about a year and a half out from definitive surgery for her breast cancer with no evidence of disease recurrence.  This is very favorable.  She is tolerating anastrozole well and the plan will be to continue that a total of 5 years.  Her breasts are still very dense.  I would like to do breast MRIs every 2 to 3 years just to make sure were not missing something.  Her last breast MRI was August 2017.  Accordingly she should have another MRI by August 2020.  She is going to be seeing Dr. Donne Hazel this month.  Likely she will start seeing him yearly from that point.  Accordingly I will see her again late October or early November and yearly from that point.  She knows to call for any other issues that may develop before the next visit.   Edwardo Wojnarowski, Virgie Dad, MD  11/19/17 3:49 PM Medical Oncology and Hematology [facility] [address] [place], [place] [address] Tel. [phone]    Fax. [phone]  This document serves as a record of services personally performed by Lurline Del, MD. It was created on his behalf by Sheron Nightingale, a trained medical scribe. The creation of this record is based on the scribe's personal observations and the provider's statements to them.   I have reviewed the above documentation for accuracy and completeness, and I agree with the above.

## 2017-11-19 NOTE — Telephone Encounter (Signed)
Patient declined avs and calendar of upcoming appointments.  °

## 2017-12-05 ENCOUNTER — Other Ambulatory Visit: Payer: Self-pay | Admitting: Oncology

## 2017-12-15 ENCOUNTER — Other Ambulatory Visit: Payer: Self-pay | Admitting: Oncology

## 2017-12-15 DIAGNOSIS — C50511 Malignant neoplasm of lower-outer quadrant of right female breast: Secondary | ICD-10-CM

## 2017-12-15 DIAGNOSIS — R922 Inconclusive mammogram: Secondary | ICD-10-CM

## 2017-12-15 DIAGNOSIS — Z170 Estrogen receptor positive status [ER+]: Principal | ICD-10-CM

## 2017-12-15 DIAGNOSIS — Z1239 Encounter for other screening for malignant neoplasm of breast: Secondary | ICD-10-CM

## 2017-12-15 NOTE — Progress Notes (Signed)
Dr. Donne Hazel, Dr. Marcelo Baldy and I have been discussing Michele Alvarez's situation.  Her breast density is category D, which means it is really hard to see anything with mammography.  At the time of her right lumpectomy August 2017 she had the anterior margin focally involved with invasive ductal carcinoma, and noninvasive breast cancer posteriorly was focally 0.1 cm to the margin.  Dr. Donne Hazel and Dr. Howell Rucks strongly suggest that yearly MRI is best and I agree.  Of course the only concern is cost, which may be prohibitive for Michele Alvarez  She and I did discuss getting an MRI of the breast this year, and that is in the pipeline.  When she returns to see me in October we will see if we can find a way to do this on a yearly basis.

## 2018-05-06 NOTE — Progress Notes (Signed)
[place]  [phone] Fax:(336) [phone]   ID: Michele Alvarez DOB: 1960/08/17  MR#: [id number]  KDT#:[id number]  Patient Care Team: Jonathon Jordan, MD as PCP - General (Family Medicine) Avon Gully, NP as Nurse Practitioner (Obstetrics and Gynecology) [name], MD as Consulting Physician (General Surgery) Magrinat, Virgie Dad, MD as Consulting Physician (Oncology) Eppie Gibson, MD as Attending Physician (Radiation Oncology) Jovita Gamma, MD as Consulting Physician (Neurosurgery) Delice Bison, Charlestine Massed, NP as Nurse Practitioner (Hematology and Oncology) PCP: Jonathon Jordan, MD OTHER MD:  CHIEF COMPLAINT: Estrogen receptor positive breast cancer  CURRENT TREATMENT: Anastrozole  BREAST CANCER HISTORY: From the original intake note:  "Michele Alvarez" had routine screening mammography at [facility] [place] 08/07/2015. There was an area of calcification in the right breast 2:00 position. There was also an area of calcification in the left breast 7:00 position. She was recalled for bilateral diagnostic mammography 08/12/2015. The left breast calcifications were felt to be likely benign and repeat mammography in 6 months was suggested.  On the right, breast density was category D. There was a 1.2 cm irregular mass at the 6:00, inframammary position and also suspicious calcifications. Adding those together the area measured 2.5 cm. Ultrasound of the right breast on the same day found a 1.3 cm lobulated mass in the right breast at the 6:00 position. There was a 1.7 cm axillary lymph node with a fatty hilum.   On 08/30/2015 the patient underwent biopsy of the breast mass, the abnormal lymph node, and the area of calcifications. The calcifications ([id number]) were associated with ductal carcinoma in situ, with the prognostic panel pending. 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 The lymph node also was positive.  Note that the clip to the patient's breast mass did not deploy.  The patient's subsequent history is as detailed below  INTERVAL HISTORY: Michele Alvarez returns today for follow-up and treatment of her estrogen receptor positive breast cancer.  She continues on anastrozole, with good tolerance. She reports minimal hot flashes and minimal vaginal dryness.    REVIEW OF SYSTEMS: Michele Alvarez reports that she has trouble exercising because of her back pain (she has undergone several surgeries in the past). She reports numbness and burning to her right foot, which she attributes to these surgeries. She denies unusual headaches, visual changes, nausea, vomiting, or dizziness. There has been no unusual cough, phlegm production, or pleurisy. This been no change in bowel or bladder habits. She denies unexplained fatigue or unexplained weight loss, bleeding, rash, or fever. A detailed review of systems was otherwise stable.    PAST MEDICAL HISTORY: Past Medical History:  Diagnosis Date  . Breast cancer of lower-outer quadrant of right female breast ([place]) 08/19/2015  . History of radiation therapy 04/18/16- 05/30/16   Right Breast 50 Gy in 25 fractions. then boosted to 60 Gy in 5 fractions  . Hot flashes   . PONV (postoperative nausea and vomiting)     PAST SURGICAL HISTORY: Past Surgical History:  Procedure Laterality Date  . BACK SURGERY  2007, 2014   x2, lumbar surgery used same incision for both surgeries.   Marland Kitchen BREAST LUMPECTOMY WITH RADIOACTIVE SEED AND AXILLARY LYMPH NODE DISSECTION Right 03/08/2016   Procedure: RIGHT BREAST BRACKETED RADIOACTIVE SEED GUIDED LUMPECTOMY RIGHT AXILLARY SENTINEL LYMPH NODE BIOPSY AND RIGHT RADIOACTIVE SEED TARGETED AXILLARY NODE DISSECTION;  Surgeon: [name], MD;  Location: [place];  Service:  General;  Laterality: Right;  . NECK SURGERY  August 26, 1999  . PORT-A-CATH REMOVAL Right  03/08/2016   Procedure: REMOVAL PORT-A-CATH;  Surgeon: [name], MD;  Location: [place];  Service: General;  Laterality: Right;  . PORTACATH PLACEMENT Right 09/09/2015   Procedure: INSERTION PORT-A-CATH WITH [place] ;  Surgeon: [name], MD;  Location: [place];  Service: General;  Laterality: Right;    FAMILY HISTORY No family history on file. The patient's father died in 08-25-2014 at the age of 47. He had significant COPD. The patient's mother died at the age of 44 from Parkinson's disease. The patient has 1 brother, 1 sister. There is no cancer history in the family to her knowledge.  GYNECOLOGIC HISTORY:  No LMP recorded. Patient is postmenopausal. Menarche age 38. She has GI asked P0. She stopped having periods in January 2014. She never took hormone replacement or oral contraceptives.  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 She lives with her partner, [name], and 2 cats.     ADVANCED DIRECTIVES: Not in place. At the 09/01/2015 clinic visit the patient was given the appropriate forms to complete and notarize at her discretion.   HEALTH MAINTENANCE: Social History   Tobacco Use  . Smoking status: Never Smoker  . Smokeless tobacco: Never Used  Substance Use Topics  . Alcohol use: Yes    Comment: social, she drinks one alchohol drink daily.   . Drug use: No     Colonoscopy: Never   PAP:  Bone density: Scheduled for 2018-08-25.  Lipid panel:  Allergies  Allergen Reactions  . Latex Other (See Comments)    Fever blisters  . Skin Adhesives Dermatitis    Reaction to dermabond, do not use  Is below her leg. He is a little isn't strange people on overdose of this area I have a patient who refuses to hear her CA-125 results  Current Outpatient Medications  Medication Sig Dispense Refill  . anastrozole (ARIMIDEX) 1 MG tablet Take 1 tablet (1 mg total) by mouth daily. 90 tablet 3  . cholecalciferol (VITAMIN D)  1000 units tablet Take 1 tablet (1,000 Units total) by mouth daily. (Patient not taking: Reported on 02/23/2017) 100 tablet 3  . Multiple Vitamin (MULTIVITAMIN) tablet Take 1 tablet by mouth daily.     No current facility-administered medications for this visit.     OBJECTIVE: Middle-aged white woman who appears younger than stated age   57:   05/07/18 0900  BP: 107/64  Pulse: 72  Resp: 20  Temp: 98.3 F (36.8 C)  SpO2: 100%     Body mass index is 25.76 kg/m.    ECOG FS:1 - Symptomatic but completely ambulatory  Sclerae unicteric, pupils round and equal Oropharynx clear and moist No cervical or supraclavicular adenopathy Lungs no rales or rhonchi Heart regular rate and rhythm Abd soft, nontender, positive bowel sounds MSK no focal spinal tenderness, no upper extremity lymphedema Neuro: nonfocal, well oriented, appropriate affect Breasts: The right breast is status post lumpectomy and radiation.  There is no evidence of local recurrence per the left breast is benign.  Both axillae are benign.   RESULTS:  CMP     Component Value Date/Time   NA 142 11/19/2017 1455   NA 142 06/18/2017 1441   K 4.0 11/19/2017 1455   K 4.0 06/18/2017 1441   CL 107 11/19/2017 1455   CO2 30 (H) 11/19/2017 1455   CO2 28 06/18/2017 1441  GLUCOSE 115 11/19/2017 1455   GLUCOSE 106 06/18/2017 1441   BUN 18 11/19/2017 1455   BUN 12.8 06/18/2017 1441   CREATININE 0.85 11/19/2017 1455   CREATININE 0.9 06/18/2017 1441   CALCIUM 9.6 11/19/2017 1455   CALCIUM 9.5 06/18/2017 1441   PROT 6.5 11/19/2017 1455   PROT 6.9 06/18/2017 1441   ALBUMIN 4.2 11/19/2017 1455   ALBUMIN 4.2 06/18/2017 1441   AST 18 11/19/2017 1455   AST 22 06/18/2017 1441   ALT 13 11/19/2017 1455   ALT 21 06/18/2017 1441   ALKPHOS 74 11/19/2017 1455   ALKPHOS 85 06/18/2017 1441   BILITOT 0.3 11/19/2017 1455   BILITOT 0.34 06/18/2017 1441   GFRNONAA >60 11/19/2017 1455   GFRAA >60 11/19/2017 1455    INo results  found for: SPEP, UPEP  Lab Results  Component Value Date   WBC 3.2 (L) 05/07/2018   NEUTROABS 1.6 (L) 05/07/2018   HGB 12.8 05/07/2018   HCT 39.4 05/07/2018   MCV 94.3 05/07/2018   PLT 201 05/07/2018      Chemistry      Component Value Date/Time   NA 142 11/19/2017 1455   NA 142 06/18/2017 1441   K 4.0 11/19/2017 1455   K 4.0 06/18/2017 1441   CL 107 11/19/2017 1455   CO2 30 (H) 11/19/2017 1455   CO2 28 06/18/2017 1441   BUN 18 11/19/2017 1455   BUN 12.8 06/18/2017 1441   CREATININE 0.85 11/19/2017 1455   CREATININE 0.9 06/18/2017 1441      Component Value Date/Time   CALCIUM 9.6 11/19/2017 1455   CALCIUM 9.5 06/18/2017 1441   ALKPHOS 74 11/19/2017 1455   ALKPHOS 85 06/18/2017 1441   AST 18 11/19/2017 1455   AST 22 06/18/2017 1441   ALT 13 11/19/2017 1455   ALT 21 06/18/2017 1441   BILITOT 0.3 11/19/2017 1455   BILITOT 0.34 06/18/2017 1441       No results found for: LABCA2  No components found for: LABCA125  No results for input(s): INR in the last 168 hours.  Urinalysis No results found for: COLORURINE, APPEARANCEUR, LABSPEC, PHURINE, GLUCOSEU, HGBUR, BILIRUBINUR, KETONESUR, PROTEINUR, UROBILINOGEN, NITRITE, LEUKOCYTESUR  ELIGIBLE FOR AVAILABLE RESEARCH PROTOCOL: no  STUDIES: She is due for mammography and bone density study at [facility] February 2020   ASSESSMENT: 57 y.o. [place] woman status post right breast lower outer quadrant and axillary lymph node biopsy 08/30/2015, both positive for a clinical T1-2 N1, stage II invasive ductal carcinoma, grade 3, estrogen and progesterone receptor positive, HER-2/neu nonamplified, with an MIB-1 of 80%  (1) neoadjuvant chemotherapy started 09/16/2015 consisting of doxorubicin and cyclophosphamide in dose dense fashion 4, completed 10/28/2015, followed by paclitaxel weekly 12  started 11/19/2015, completed 02/11/2016  (2) Status post right lumpectomy and sentinel lymph node sampling 03/08/2016 for a residual  ypT1b ypN0 invasive ductal carcinoma, grade 2, with focally positive margins not requiring further excision  (3) adjuvant radiation 04/18/16 - 05/30/16 Site/dose:Total dose 60 Gy in 30 fx  (4) anastrozole started 07/17/2016  (a) bone density 08/25/2016 at [facility] showed a T score of -2.4  (5) participating in DCP-001 study   PLAN: Michele Alvarez is not a little over 2 years out from definitive surgery for breast cancer with no evidence of disease recurrence.  This is very favorable.  She is tolerating anastrozole well and the plan is to continue that a minimum of 5 years and maximum of 7 years.  She does have near osteoporosis.  She is  taking vitamin D and walking.  At the next visit when we have the results of her next bone density we can consider bisphosphonates, denosumab, or other pharmacologic agents to improve bone density  I think she might benefit from a visit to [place] for discussion and possibly intervention of her pain issues.  She will set that up herself and let me know if she has difficulty getting through.  She is already followed in that clinic of course by Dr. Sherwood Gambler  I will see her again in 1 year.  She knows to call for any issues that may develop before the next visit.  Magrinat, Virgie Dad, MD  05/07/18 9:32 AM Medical Oncology and Hematology [facility] [address] [place], [place] [address] Tel. [phone]    Fax. [phone]    I, Wilburn Mylar am acting as scribe for Dr. Virgie Dad. Magrinat.  I, Lurline Del MD, have reviewed the above documentation for accuracy and completeness, and I agree with the above.

## 2018-05-07 ENCOUNTER — Inpatient Hospital Stay (HOSPITAL_BASED_OUTPATIENT_CLINIC_OR_DEPARTMENT_OTHER): Payer: 59 | Admitting: Oncology

## 2018-05-07 ENCOUNTER — Inpatient Hospital Stay: Payer: 59 | Attending: Oncology

## 2018-05-07 ENCOUNTER — Encounter: Payer: Self-pay | Admitting: Oncology

## 2018-05-07 ENCOUNTER — Telehealth: Payer: Self-pay | Admitting: Oncology

## 2018-05-07 VITALS — BP 107/64 | HR 72 | Temp 98.3°F | Resp 20 | Ht 68.0 in | Wt 169.4 lb

## 2018-05-07 DIAGNOSIS — M545 Low back pain: Secondary | ICD-10-CM | POA: Diagnosis not present

## 2018-05-07 DIAGNOSIS — Z170 Estrogen receptor positive status [ER+]: Secondary | ICD-10-CM | POA: Diagnosis not present

## 2018-05-07 DIAGNOSIS — N951 Menopausal and female climacteric states: Secondary | ICD-10-CM

## 2018-05-07 DIAGNOSIS — C50511 Malignant neoplasm of lower-outer quadrant of right female breast: Secondary | ICD-10-CM | POA: Insufficient documentation

## 2018-05-07 DIAGNOSIS — M8580 Other specified disorders of bone density and structure, unspecified site: Secondary | ICD-10-CM

## 2018-05-07 DIAGNOSIS — Z79899 Other long term (current) drug therapy: Secondary | ICD-10-CM | POA: Diagnosis not present

## 2018-05-07 DIAGNOSIS — N898 Other specified noninflammatory disorders of vagina: Secondary | ICD-10-CM

## 2018-05-07 LAB — CBC WITH DIFFERENTIAL/PLATELET
ABS IMMATURE GRANULOCYTES: 0.01 10*3/uL (ref 0.00–0.07)
BASOS PCT: 0 %
Basophils Absolute: 0 10*3/uL (ref 0.0–0.1)
Eosinophils Absolute: 0.1 10*3/uL (ref 0.0–0.5)
Eosinophils Relative: 2 %
HCT: 39.4 % (ref 36.0–46.0)
HEMOGLOBIN: 12.8 g/dL (ref 12.0–15.0)
IMMATURE GRANULOCYTES: 0 %
LYMPHS PCT: 38 %
Lymphs Abs: 1.2 10*3/uL (ref 0.7–4.0)
MCH: 30.6 pg (ref 26.0–34.0)
MCHC: 32.5 g/dL (ref 30.0–36.0)
MCV: 94.3 fL (ref 80.0–100.0)
MONO ABS: 0.3 10*3/uL (ref 0.1–1.0)
Monocytes Relative: 9 %
NEUTROS ABS: 1.6 10*3/uL — AB (ref 1.7–7.7)
Neutrophils Relative %: 51 %
PLATELETS: 201 10*3/uL (ref 150–400)
RBC: 4.18 MIL/uL (ref 3.87–5.11)
RDW: 12.2 % (ref 11.5–15.5)
WBC: 3.2 10*3/uL — AB (ref 4.0–10.5)
nRBC: 0 % (ref 0.0–0.2)

## 2018-05-07 LAB — COMPREHENSIVE METABOLIC PANEL
ALBUMIN: 4.2 g/dL (ref 3.5–5.0)
ALT: 19 U/L (ref 0–44)
ANION GAP: 10 (ref 5–15)
AST: 21 U/L (ref 15–41)
Alkaline Phosphatase: 82 U/L (ref 38–126)
BUN: 20 mg/dL (ref 6–20)
CHLORIDE: 104 mmol/L (ref 98–111)
CO2: 28 mmol/L (ref 22–32)
Calcium: 9.8 mg/dL (ref 8.9–10.3)
Creatinine, Ser: 0.91 mg/dL (ref 0.44–1.00)
GFR calc Af Amer: >60 mL/min (ref >60)
Glucose, Bld: 99 mg/dL (ref 70–99)
POTASSIUM: 3.9 mmol/L (ref 3.5–5.1)
Sodium: 142 mmol/L (ref 135–145)
TOTAL PROTEIN: 6.9 g/dL (ref 6.5–8.1)
Total Bilirubin: 0.8 mg/dL (ref 0.3–1.2)

## 2018-05-07 MED ORDER — ANASTROZOLE 1 MG PO TABS: 1.0000 mg | ORAL_TABLET | Freq: Every day | ORAL | 3 refills | Status: DC

## 2018-05-07 NOTE — Telephone Encounter (Signed)
Gave avs and calendar ° °

## 2018-09-03 ENCOUNTER — Encounter: Payer: Self-pay | Admitting: Oncology

## 2019-05-07 NOTE — Progress Notes (Signed)
[place]  Telephone:(336) [phone] Fax:(336) [phone]   ID: Michele Alvarez DOB: 21-Aug-1960  MR#: [id number]  JFH#:[id number]  Patient Care Team: Jonathon Jordan, MD as PCP - General (Family Medicine) Avon Gully, NP as Nurse Practitioner (Obstetrics and Gynecology) [name], MD as Consulting Physician (General Surgery) Zurie Platas, Virgie Dad, MD as Consulting Physician (Oncology) Eppie Gibson, MD as Attending Physician (Radiation Oncology) Jovita Gamma, MD as Consulting Physician (Neurosurgery) Delice Bison, Charlestine Massed, NP as Nurse Practitioner (Hematology and Oncology) OTHER MD:  CHIEF COMPLAINT: Estrogen receptor positive breast cancer  CURRENT TREATMENT: Anastrozole   INTERVAL HISTORY: Michele Alvarez returns today for follow-up of her estrogen receptor positive breast cancer.    She continues on anastrozole, with good tolerance. She reports minimal hot flashes and minimal vaginal dryness.   Since her last visit, she underwent bone density testing on 09/03/2018. This showed a T-score of -2.5, which is considered osteoporotic.   She also underwent bilateral diagnostic mammography with tomography at [facility] on 09/03/2018 showing: breast density category C; no evidence of malignancy in either breast.   REVIEW OF SYSTEMS: Michele Alvarez    BREAST CANCER HISTORY: From the original intake note:  "Michele Alvarez" had routine screening mammography at [facility] 08/07/2015. There was an area of calcification in the right breast 2:00 position. There was also an area of calcification in the left breast 7:00 position. She was recalled for bilateral diagnostic mammography 08/12/2015. The left breast calcifications were felt to be likely benign and repeat mammography in 6 months was suggested.  On the right, breast density was category D. There was a 1.2 cm irregular mass at the 6:00, inframammary position and also suspicious calcifications. Adding those together the area measured 2.5 cm.  Ultrasound of the right breast on the same day found a 1.3 cm lobulated mass in the right breast at the 6:00 position. There was a 1.7 cm axillary lymph node with a fatty hilum.   On 08/30/2015 the patient underwent biopsy of the breast mass, the abnormal lymph node, and the area of calcifications. The calcifications ([id number]) were associated with ductal carcinoma in situ, with the prognostic panel pending. 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 The lymph node also was positive.  Note that the clip to the patient's breast mass did not deploy.  The patient's subsequent history is as detailed below   PAST MEDICAL HISTORY: Past Medical History:  Diagnosis Date  . Breast cancer ([place])   . Breast cancer of lower-outer quadrant of right female breast ([place]) 08/19/2015  . History of radiation therapy 04/18/16- 05/30/16   Right Breast 50 Gy in 25 fractions. then boosted to 60 Gy in 5 fractions  . Hot flashes   . PONV (postoperative nausea and vomiting)     PAST SURGICAL HISTORY: Past Surgical History:  Procedure Laterality Date  . BACK SURGERY  2007, 2014   x2, lumbar surgery used same incision for both surgeries.   Marland Kitchen BREAST LUMPECTOMY WITH RADIOACTIVE SEED AND AXILLARY LYMPH NODE DISSECTION Right 03/08/2016   Procedure: RIGHT BREAST BRACKETED RADIOACTIVE SEED GUIDED LUMPECTOMY RIGHT AXILLARY SENTINEL LYMPH NODE BIOPSY AND RIGHT RADIOACTIVE SEED TARGETED AXILLARY NODE DISSECTION;  Surgeon: [name], MD;  Location: [place];  Service: General;  Laterality: Right;  . NECK SURGERY  2001  . PORT-A-CATH REMOVAL Right 03/08/2016   Procedure: REMOVAL PORT-A-CATH;  Surgeon: [name], MD;  Location: [facility];  Service: General;  Laterality: Right;  . PORTACATH PLACEMENT Right 09/09/2015   Procedure: INSERTION PORT-A-CATH WITH [place] ;   Surgeon: [name], MD;  Location: [place];  Service: General;  Laterality: Right;    FAMILY HISTORY Family History  Problem Relation Age of Onset  . Parkinson's disease Mother   . COPD Father   . Cancer Neg Hx    The patient's father died in 09/30/2014 at the age of 74. He had significant COPD. The patient's mother died at the age of 1 from Parkinson's disease. The patient has 1 brother, 1 sister. There is no cancer history in the family to her knowledge.   GYNECOLOGIC HISTORY:  No LMP recorded. Patient is postmenopausal. Menarche age 23. She has GI asked P0. She stopped having periods in January 2014. She never took hormone replacement or oral contraceptives.   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 She lives with her partner, [address] and 2 cats.     ADVANCED DIRECTIVES: She has named her partner, Michele Alvarez, as her [place]: Social History   Tobacco Use  . Smoking status: Never Smoker  . Smokeless tobacco: Never Used  Substance Use Topics  . Alcohol use: Yes    Comment: social, she drinks one alchohol drink daily.   . Drug use: No     Colonoscopy: Never   PAP:  Bone density: 09-30-2018, -2.5  Lipid panel:  Allergies  Allergen Reactions  . Latex Other (See Comments)    Fever blisters  . Skin Adhesives Dermatitis    Reaction to dermabond, do not use    Current Outpatient Medications  Medication Sig Dispense Refill  . anastrozole (ARIMIDEX) 1 MG tablet Take 1 tablet (1 mg total) by mouth daily. 90 tablet 3  . cholecalciferol (VITAMIN D) 1000 units tablet Take 1 tablet (1,000 Units total) by mouth daily. (Patient not taking: Reported on 02/23/2017) 100 tablet 3  . Multiple Vitamin (MULTIVITAMIN) tablet Take 1 tablet by mouth daily.     No current facility-administered medications for this visit.     OBJECTIVE: Middle-aged white woman in no acute distress   Vitals:   05/08/19 1515  BP: 121/90  Pulse: 85   Resp: 18  Temp: 98.2 F (36.8 C)  SpO2: 100%     Body mass index is 25.57 kg/m.    ECOG FS:1 - Symptomatic but completely ambulatory  Sclerae unicteric, EOMs intact Wearing a mask No cervical or supraclavicular adenopathy Lungs no rales or rhonchi Heart regular rate and rhythm Abd soft, nontender, positive bowel sounds MSK no focal spinal tenderness, no upper extremity lymphedema Neuro: nonfocal, well oriented, appropriate affect Breasts: The right breast has undergone lumpectomy followed by radiation with no evidence of disease recurrence per the left breast is benign.  Both axillae are benign.    LAB RESULTS:  CMP     Component Value Date/Time   NA 142 05/08/2019 1446   NA 142 06/18/2017 1441   K 4.0 05/08/2019 1446   K 4.0 06/18/2017 1441   CL 105 05/08/2019 1446   CO2 28 05/08/2019 1446   CO2 28 06/18/2017 1441   GLUCOSE 105 (H) 05/08/2019 1446   GLUCOSE 106 06/18/2017 1441   BUN 14 05/08/2019 1446   BUN 12.8 06/18/2017 1441   CREATININE 0.88 05/08/2019 1446   CREATININE 0.9 06/18/2017 1441   CALCIUM 9.9 05/08/2019 1446   CALCIUM 9.5 06/18/2017 1441   PROT 7.0 05/08/2019 1446  PROT 6.9 06/18/2017 1441   ALBUMIN 4.5 05/08/2019 1446   ALBUMIN 4.2 06/18/2017 1441   AST 22 05/08/2019 1446   AST 22 06/18/2017 1441   ALT 20 05/08/2019 1446   ALT 21 06/18/2017 1441   ALKPHOS 79 05/08/2019 1446   ALKPHOS 85 06/18/2017 1441   BILITOT 0.6 05/08/2019 1446   BILITOT 0.34 06/18/2017 1441   GFRNONAA >60 05/08/2019 1446   GFRAA >60 05/08/2019 1446    INo results found for: SPEP, UPEP  Lab Results  Component Value Date   WBC 4.9 05/08/2019   NEUTROABS 2.7 05/08/2019   HGB 12.7 05/08/2019   HCT 38.6 05/08/2019   MCV 93.7 05/08/2019   PLT 223 05/08/2019      Chemistry      Component Value Date/Time   NA 142 05/08/2019 1446   NA 142 06/18/2017 1441   K 4.0 05/08/2019 1446   K 4.0 06/18/2017 1441   CL 105 05/08/2019 1446   CO2 28 05/08/2019 1446   CO2 28  06/18/2017 1441   BUN 14 05/08/2019 1446   BUN 12.8 06/18/2017 1441   CREATININE 0.88 05/08/2019 1446   CREATININE 0.9 06/18/2017 1441      Component Value Date/Time   CALCIUM 9.9 05/08/2019 1446   CALCIUM 9.5 06/18/2017 1441   ALKPHOS 79 05/08/2019 1446   ALKPHOS 85 06/18/2017 1441   AST 22 05/08/2019 1446   AST 22 06/18/2017 1441   ALT 20 05/08/2019 1446   ALT 21 06/18/2017 1441   BILITOT 0.6 05/08/2019 1446   BILITOT 0.34 06/18/2017 1441       No results found for: LABCA2  No components found for: LABCA125  No results for input(s): INR in the last 168 hours.  Urinalysis No results found for: COLORURINE, APPEARANCEUR, LABSPEC, PHURINE, GLUCOSEU, HGBUR, BILIRUBINUR, KETONESUR, PROTEINUR, UROBILINOGEN, NITRITE, LEUKOCYTESUR  ELIGIBLE FOR AVAILABLE RESEARCH PROTOCOL: no  STUDIES: No results found.    ASSESSMENT: 58 y.o. [place] woman status post right breast lower outer quadrant and axillary lymph node biopsy 08/30/2015, both positive for a clinical T1-2 N1, stage II invasive ductal carcinoma, grade 3, estrogen and progesterone receptor positive, HER-2/neu nonamplified, with an MIB-1 of 80%  (1) neoadjuvant chemotherapy started 09/16/2015 consisting of doxorubicin and cyclophosphamide in dose dense fashion 4, completed 10/28/2015, followed by paclitaxel weekly 12  started 11/19/2015, completed 02/11/2016  (2) Status post right lumpectomy and sentinel lymph node sampling 03/08/2016 for a residual ypT1b ypN0 invasive ductal carcinoma, grade 2, with focally positive margins not requiring further excision  (3) adjuvant radiation 04/18/16 - 05/30/16 Site/dose:Total dose 60 Gy in 30 fx  (4) anastrozole started 07/17/2016  (a) bone density 08/25/2016 at [facility] showed a T score of -2.4  (b) bone density February 2020 showed osteoporosis with a T score of -2.5  (5) participating in DCP-001 study   PLAN: Michele Alvarez is now a little over 3 years out from definitive surgery for  her breast cancer with no evidence of disease recurrence.  This is very favorable.  She is tolerating anastrozole well and the plan is to continue that a minimum of 5 years.  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  We discussed her options including oral bisphosphonates, intravenous bisphosphonates, or denosumab/Prolia.  After much discussion she would prefer the denosumab.  There is data that this also lowers her risk of breast cancer recurrence an additional 2 to 3%.  She has a good understanding of the possible toxicities side effects and complications of this agent including the  very rare case of osteonecrosis of the jaw  Hopefully we can get this for her next week.  If so she will receive it again 6 months later.  And then she will receive her third dose when she sees me again a year from now  We would continue this for a total of 4 almost 5 doses until we repeat her bone density February 2022  She knows to call for any other issue that may develop before the next visit.  Mackenna Kamer, Virgie Dad, MD  05/08/19 3:48 PM Medical Oncology and Hematology [facility] [address] [place] [employer] [place], [place] [address] Tel. [phone]    Fax. [phone]    I, Wilburn Mylar am acting as scribe for Dr. Virgie Dad. Mychaela Lennartz.  I, Lurline Del MD, have reviewed the above documentation for accuracy and completeness, and I agree with the above.

## 2019-05-08 ENCOUNTER — Other Ambulatory Visit: Payer: Self-pay

## 2019-05-08 ENCOUNTER — Inpatient Hospital Stay (HOSPITAL_BASED_OUTPATIENT_CLINIC_OR_DEPARTMENT_OTHER): Payer: 59 | Admitting: Oncology

## 2019-05-08 ENCOUNTER — Inpatient Hospital Stay: Payer: 59 | Attending: Oncology

## 2019-05-08 VITALS — BP 121/90 | HR 85 | Temp 98.2°F | Resp 18 | Ht 68.0 in | Wt 168.2 lb

## 2019-05-08 DIAGNOSIS — Z170 Estrogen receptor positive status [ER+]: Secondary | ICD-10-CM | POA: Insufficient documentation

## 2019-05-08 DIAGNOSIS — M8580 Other specified disorders of bone density and structure, unspecified site: Secondary | ICD-10-CM | POA: Diagnosis not present

## 2019-05-08 DIAGNOSIS — N951 Menopausal and female climacteric states: Secondary | ICD-10-CM | POA: Diagnosis not present

## 2019-05-08 DIAGNOSIS — C773 Secondary and unspecified malignant neoplasm of axilla and upper limb lymph nodes: Secondary | ICD-10-CM | POA: Insufficient documentation

## 2019-05-08 DIAGNOSIS — N898 Other specified noninflammatory disorders of vagina: Secondary | ICD-10-CM | POA: Insufficient documentation

## 2019-05-08 DIAGNOSIS — C50511 Malignant neoplasm of lower-outer quadrant of right female breast: Secondary | ICD-10-CM

## 2019-05-08 DIAGNOSIS — M810 Age-related osteoporosis without current pathological fracture: Secondary | ICD-10-CM | POA: Insufficient documentation

## 2019-05-08 LAB — CBC WITH DIFFERENTIAL/PLATELET
Abs Immature Granulocytes: 0.01 10*3/uL (ref 0.00–0.07)
Basophils Absolute: 0 10*3/uL (ref 0.0–0.1)
Basophils Relative: 0 %
Eosinophils Absolute: 0 10*3/uL (ref 0.0–0.5)
Eosinophils Relative: 1 %
HCT: 38.6 % (ref 36.0–46.0)
Hemoglobin: 12.7 g/dL (ref 12.0–15.0)
Immature Granulocytes: 0 %
Lymphocytes Relative: 38 %
Lymphs Abs: 1.9 10*3/uL (ref 0.7–4.0)
MCH: 30.8 pg (ref 26.0–34.0)
MCHC: 32.9 g/dL (ref 30.0–36.0)
MCV: 93.7 fL (ref 80.0–100.0)
Monocytes Absolute: 0.3 10*3/uL (ref 0.1–1.0)
Monocytes Relative: 6 %
Neutro Abs: 2.7 10*3/uL (ref 1.7–7.7)
Neutrophils Relative %: 55 %
Platelets: 223 10*3/uL (ref 150–400)
RBC: 4.12 MIL/uL (ref 3.87–5.11)
RDW: 12.5 % (ref 11.5–15.5)
WBC: 4.9 10*3/uL (ref 4.0–10.5)
nRBC: 0 % (ref 0.0–0.2)

## 2019-05-08 LAB — COMPREHENSIVE METABOLIC PANEL
ALT: 20 U/L (ref 0–44)
AST: 22 U/L (ref 15–41)
Albumin: 4.5 g/dL (ref 3.5–5.0)
Alkaline Phosphatase: 79 U/L (ref 38–126)
Anion gap: 9 (ref 5–15)
BUN: 14 mg/dL (ref 6–20)
CO2: 28 mmol/L (ref 22–32)
Calcium: 9.9 mg/dL (ref 8.9–10.3)
Chloride: 105 mmol/L (ref 98–111)
Creatinine, Ser: 0.88 mg/dL (ref 0.44–1.00)
GFR calc Af Amer: >60 mL/min (ref >60)
GFR calc non Af Amer: >60 mL/min (ref >60)
Glucose, Bld: 105 mg/dL — ABNORMAL HIGH (ref 70–99)
Potassium: 4 mmol/L (ref 3.5–5.1)
Sodium: 142 mmol/L (ref 135–145)
Total Bilirubin: 0.6 mg/dL (ref 0.3–1.2)
Total Protein: 7 g/dL (ref 6.5–8.1)

## 2019-05-08 MED ORDER — ANASTROZOLE 1 MG PO TABS: 1.0000 mg | ORAL_TABLET | Freq: Every day | ORAL | 3 refills | Status: DC

## 2019-05-09 ENCOUNTER — Telehealth: Payer: Self-pay | Admitting: Oncology

## 2019-05-09 NOTE — Telephone Encounter (Signed)
I could not reach patient regarding schedule  °

## 2019-05-14 ENCOUNTER — Other Ambulatory Visit: Payer: Self-pay | Admitting: *Deleted

## 2019-05-16 ENCOUNTER — Inpatient Hospital Stay: Payer: 59

## 2019-05-16 ENCOUNTER — Other Ambulatory Visit: Payer: Self-pay

## 2019-05-16 VITALS — BP 121/82 | HR 73 | Temp 98.5°F | Resp 17

## 2019-05-16 DIAGNOSIS — C50511 Malignant neoplasm of lower-outer quadrant of right female breast: Secondary | ICD-10-CM

## 2019-05-16 DIAGNOSIS — Z170 Estrogen receptor positive status [ER+]: Secondary | ICD-10-CM

## 2019-05-16 LAB — CBC WITH DIFFERENTIAL/PLATELET
Abs Immature Granulocytes: 0.01 10*3/uL (ref 0.00–0.07)
Basophils Absolute: 0 10*3/uL (ref 0.0–0.1)
Basophils Relative: 0 %
Eosinophils Absolute: 0.1 10*3/uL (ref 0.0–0.5)
Eosinophils Relative: 1 %
HCT: 39 % (ref 36.0–46.0)
Hemoglobin: 12.8 g/dL (ref 12.0–15.0)
Immature Granulocytes: 0 %
Lymphocytes Relative: 35 %
Lymphs Abs: 1.7 10*3/uL (ref 0.7–4.0)
MCH: 31.2 pg (ref 26.0–34.0)
MCHC: 32.8 g/dL (ref 30.0–36.0)
MCV: 95.1 fL (ref 80.0–100.0)
Monocytes Absolute: 0.4 10*3/uL (ref 0.1–1.0)
Monocytes Relative: 8 %
Neutro Abs: 2.8 10*3/uL (ref 1.7–7.7)
Neutrophils Relative %: 56 %
Platelets: 218 10*3/uL (ref 150–400)
RBC: 4.1 MIL/uL (ref 3.87–5.11)
RDW: 12.4 % (ref 11.5–15.5)
WBC: 5 10*3/uL (ref 4.0–10.5)
nRBC: 0 % (ref 0.0–0.2)

## 2019-05-16 LAB — COMPREHENSIVE METABOLIC PANEL
ALT: 18 U/L (ref 0–44)
AST: 22 U/L (ref 15–41)
Albumin: 4.4 g/dL (ref 3.5–5.0)
Alkaline Phosphatase: 84 U/L (ref 38–126)
Anion gap: 13 (ref 5–15)
BUN: 14 mg/dL (ref 6–20)
CO2: 29 mmol/L (ref 22–32)
Calcium: 10.1 mg/dL (ref 8.9–10.3)
Chloride: 103 mmol/L (ref 98–111)
Creatinine, Ser: 0.93 mg/dL (ref 0.44–1.00)
GFR calc Af Amer: >60 mL/min (ref >60)
GFR calc non Af Amer: >60 mL/min (ref >60)
Glucose, Bld: 100 mg/dL — ABNORMAL HIGH (ref 70–99)
Potassium: 4.1 mmol/L (ref 3.5–5.1)
Sodium: 145 mmol/L (ref 135–145)
Total Bilirubin: 0.6 mg/dL (ref 0.3–1.2)
Total Protein: 7 g/dL (ref 6.5–8.1)

## 2019-05-16 MED ORDER — DENOSUMAB 60 MG/ML ~~LOC~~ SOSY
60.0000 mg | PREFILLED_SYRINGE | Freq: Once | SUBCUTANEOUS | Status: AC
  Administered 2019-05-16: 15:00:00 60 mg via SUBCUTANEOUS

## 2019-05-16 MED ORDER — DENOSUMAB 60 MG/ML ~~LOC~~ SOSY
PREFILLED_SYRINGE | SUBCUTANEOUS | Status: AC
  Filled 2019-05-16: qty 1

## 2019-05-16 NOTE — Patient Instructions (Signed)
Denosumab injection What is this medicine? DENOSUMAB (den oh sue mab) slows bone breakdown. Prolia is used to treat osteoporosis in women after menopause and in men, and in people who are taking corticosteroids for 6 months or more. Xgeva is used to treat a high calcium level due to cancer and to prevent bone fractures and other bone problems caused by multiple myeloma or cancer bone metastases. Xgeva is also used to treat giant cell tumor of the bone. This medicine may be used for other purposes; ask your health care provider or pharmacist if you have questions. COMMON BRAND NAME(S): Prolia, XGEVA What should I tell my health care provider before I take this medicine? They need to know if you have any of these conditions:  dental disease  having surgery or tooth extraction  infection  kidney disease  low levels of calcium or Vitamin D in the blood  malnutrition  on hemodialysis  skin conditions or sensitivity  thyroid or parathyroid disease  an unusual reaction to denosumab, other medicines, foods, dyes, or preservatives  pregnant or trying to get pregnant  breast-feeding How should I use this medicine? This medicine is for injection under the skin. It is given by a health care professional in a hospital or clinic setting. A special MedGuide will be given to you before each treatment. Be sure to read this information carefully each time. For Prolia, talk to your pediatrician regarding the use of this medicine in children. Special care may be needed. For Xgeva, talk to your pediatrician regarding the use of this medicine in children. While this drug may be prescribed for children as young as 13 years for selected conditions, precautions do apply. Overdosage: If you think you have taken too much of this medicine contact a poison control center or emergency room at once. NOTE: This medicine is only for you. Do not share this medicine with others. What if I miss a dose? It is  important not to miss your dose. Call your doctor or health care professional if you are unable to keep an appointment. What may interact with this medicine? Do not take this medicine with any of the following medications:  other medicines containing denosumab This medicine may also interact with the following medications:  medicines that lower your chance of fighting infection  steroid medicines like prednisone or cortisone This list may not describe all possible interactions. Give your health care provider a list of all the medicines, herbs, non-prescription drugs, or dietary supplements you use. Also tell them if you smoke, drink alcohol, or use illegal drugs. Some items may interact with your medicine. What should I watch for while using this medicine? Visit your doctor or health care professional for regular checks on your progress. Your doctor or health care professional may order blood tests and other tests to see how you are doing. Call your doctor or health care professional for advice if you get a fever, chills or sore throat, or other symptoms of a cold or flu. Do not treat yourself. This drug may decrease your body's ability to fight infection. Try to avoid being around people who are sick. You should make sure you get enough calcium and vitamin D while you are taking this medicine, unless your doctor tells you not to. Discuss the foods you eat and the vitamins you take with your health care professional. See your dentist regularly. Brush and floss your teeth as directed. Before you have any dental work done, tell your dentist you are   receiving this medicine. Do not become pregnant while taking this medicine or for 5 months after stopping it. Talk with your doctor or health care professional about your birth control options while taking this medicine. Women should inform their doctor if they wish to become pregnant or think they might be pregnant. There is a potential for serious side  effects to an unborn child. Talk to your health care professional or pharmacist for more information. What side effects may I notice from receiving this medicine? Side effects that you should report to your doctor or health care professional as soon as possible:  allergic reactions like skin rash, itching or hives, swelling of the face, lips, or tongue  bone pain  breathing problems  dizziness  jaw pain, especially after dental work  redness, blistering, peeling of the skin  signs and symptoms of infection like fever or chills; cough; sore throat; pain or trouble passing urine  signs of low calcium like fast heartbeat, muscle cramps or muscle pain; pain, tingling, numbness in the hands or feet; seizures  unusual bleeding or bruising  unusually weak or tired Side effects that usually do not require medical attention (report to your doctor or health care professional if they continue or are bothersome):  constipation  diarrhea  headache  joint pain  loss of appetite  muscle pain  runny nose  tiredness  upset stomach This list may not describe all possible side effects. Call your doctor for medical advice about side effects. You may report side effects to FDA at 1-800-FDA-1088. Where should I keep my medicine? This medicine is only given in a clinic, doctor's office, or other health care setting and will not be stored at home. NOTE: This sheet is a summary. It may not cover all possible information. If you have questions about this medicine, talk to your doctor, pharmacist, or health care provider.  2020 Elsevier/Gold Standard (2017-11-09 16:10:44)

## 2019-10-13 ENCOUNTER — Ambulatory Visit: Payer: 59 | Attending: Internal Medicine

## 2019-10-13 DIAGNOSIS — Z23 Encounter for immunization: Secondary | ICD-10-CM

## 2019-10-13 NOTE — Progress Notes (Signed)
   Covid-19 Vaccination Clinic  Name:  Michele Alvarez    MRN: [id number] DOB: [birth year]  10/13/2019  [name] was observed post Covid-19 immunization for 15 minutes without incident. She was provided with Vaccine Information Sheet and instruction to access the V-Safe system.   [name] was instructed to call 911 with any severe reactions post vaccine: Marland Kitchen Difficulty breathing  . Swelling of face and throat  . A fast heartbeat  . A bad rash all over body  . Dizziness and weakness   Immunizations Administered    Name Date Dose VIS Date Route   Pfizer COVID-19 Vaccine 10/13/2019  8:08 AM 0.3 mL 06/27/2019 Intramuscular   Manufacturer: [place]   Lot: [id number]   [place]: [id number]

## 2019-11-04 ENCOUNTER — Ambulatory Visit: Payer: 59 | Attending: Internal Medicine

## 2019-11-04 DIAGNOSIS — Z23 Encounter for immunization: Secondary | ICD-10-CM

## 2019-11-04 NOTE — Progress Notes (Signed)
   Covid-19 Vaccination Clinic  Name:  Michele Alvarez    MRN: [id number] DOB: 07-02-1961  11/04/2019  [name] was observed post Covid-19 immunization for 15 minutes without incident. She was provided with Vaccine Information Sheet and instruction to access the V-Safe system.   [name] was instructed to call 911 with any severe reactions post vaccine: Marland Kitchen Difficulty breathing  . Swelling of face and throat  . A fast heartbeat  . A bad rash all over body  . Dizziness and weakness   Immunizations Administered    Name Date Dose VIS Date Route   Pfizer COVID-19 Vaccine 11/04/2019 11:31 AM 0.3 mL 09/10/2018 Intramuscular   Manufacturer: [place]   Lot: [id number]   [place]: [id number]

## 2019-11-06 NOTE — Progress Notes (Signed)
Pharmacist Chemotherapy Monitoring - Follow Up Assessment    I verify that I have reviewed each item in the below checklist:  . Regimen for the patient is scheduled for the appropriate day and plan matches scheduled date. Marland Kitchen Appropriate non-routine labs are ordered dependent on drug ordered. . If applicable, additional medications reviewed and ordered per protocol based on lifetime cumulative doses and/or treatment regimen.   Plan for follow-up and/or issues identified: No . I-vent associated with next due treatment: No . MD and/or nursing notified: No  Philomena Course 11/06/2019 8:39 AM

## 2019-11-12 ENCOUNTER — Other Ambulatory Visit: Payer: 59

## 2019-11-12 ENCOUNTER — Ambulatory Visit: Payer: 59

## 2019-11-24 ENCOUNTER — Other Ambulatory Visit: Payer: Self-pay

## 2019-11-24 ENCOUNTER — Inpatient Hospital Stay: Payer: 59 | Attending: Oncology

## 2019-11-24 ENCOUNTER — Inpatient Hospital Stay: Payer: 59

## 2019-11-24 VITALS — BP 123/72 | HR 83 | Temp 98.7°F | Resp 18

## 2019-11-24 DIAGNOSIS — C50511 Malignant neoplasm of lower-outer quadrant of right female breast: Secondary | ICD-10-CM

## 2019-11-24 DIAGNOSIS — M810 Age-related osteoporosis without current pathological fracture: Secondary | ICD-10-CM | POA: Diagnosis present

## 2019-11-24 DIAGNOSIS — Z170 Estrogen receptor positive status [ER+]: Secondary | ICD-10-CM

## 2019-11-24 DIAGNOSIS — C773 Secondary and unspecified malignant neoplasm of axilla and upper limb lymph nodes: Secondary | ICD-10-CM | POA: Diagnosis not present

## 2019-11-24 LAB — CBC WITH DIFFERENTIAL/PLATELET
Abs Immature Granulocytes: 0.01 10*3/uL (ref 0.00–0.07)
Basophils Absolute: 0 10*3/uL (ref 0.0–0.1)
Basophils Relative: 1 %
Eosinophils Absolute: 0 10*3/uL (ref 0.0–0.5)
Eosinophils Relative: 1 %
HCT: 38.8 % (ref 36.0–46.0)
Hemoglobin: 12.5 g/dL (ref 12.0–15.0)
Immature Granulocytes: 0 %
Lymphocytes Relative: 30 %
Lymphs Abs: 1.5 10*3/uL (ref 0.7–4.0)
MCH: 30.8 pg (ref 26.0–34.0)
MCHC: 32.2 g/dL (ref 30.0–36.0)
MCV: 95.6 fL (ref 80.0–100.0)
Monocytes Absolute: 0.4 10*3/uL (ref 0.1–1.0)
Monocytes Relative: 8 %
Neutro Abs: 2.9 10*3/uL (ref 1.7–7.7)
Neutrophils Relative %: 60 %
Platelets: 233 10*3/uL (ref 150–400)
RBC: 4.06 MIL/uL (ref 3.87–5.11)
RDW: 12.7 % (ref 11.5–15.5)
WBC: 4.8 10*3/uL (ref 4.0–10.5)
nRBC: 0 % (ref 0.0–0.2)

## 2019-11-24 LAB — COMPREHENSIVE METABOLIC PANEL
ALT: 19 U/L (ref 0–44)
AST: 22 U/L (ref 15–41)
Albumin: 4.1 g/dL (ref 3.5–5.0)
Alkaline Phosphatase: 51 U/L (ref 38–126)
Anion gap: 10 (ref 5–15)
BUN: 19 mg/dL (ref 6–20)
CO2: 29 mmol/L (ref 22–32)
Calcium: 9.2 mg/dL (ref 8.9–10.3)
Chloride: 105 mmol/L (ref 98–111)
Creatinine, Ser: 0.85 mg/dL (ref 0.44–1.00)
GFR calc Af Amer: >60 mL/min (ref >60)
GFR calc non Af Amer: >60 mL/min (ref >60)
Glucose, Bld: 99 mg/dL (ref 70–99)
Potassium: 3.9 mmol/L (ref 3.5–5.1)
Sodium: 144 mmol/L (ref 135–145)
Total Bilirubin: 0.6 mg/dL (ref 0.3–1.2)
Total Protein: 6.6 g/dL (ref 6.5–8.1)

## 2019-11-24 MED ORDER — DENOSUMAB 60 MG/ML ~~LOC~~ SOSY
60.0000 mg | PREFILLED_SYRINGE | Freq: Once | SUBCUTANEOUS | Status: AC
  Administered 2019-11-24: 60 mg via SUBCUTANEOUS

## 2019-11-24 MED ORDER — DENOSUMAB 60 MG/ML ~~LOC~~ SOSY
PREFILLED_SYRINGE | SUBCUTANEOUS | Status: AC
  Filled 2019-11-24: qty 1

## 2019-11-24 NOTE — Patient Instructions (Signed)
Denosumab injection What is this medicine? DENOSUMAB (den oh sue mab) slows bone breakdown. Prolia is used to treat osteoporosis in women after menopause and in men, and in people who are taking corticosteroids for 6 months or more. Xgeva is used to treat a high calcium level due to cancer and to prevent bone fractures and other bone problems caused by multiple myeloma or cancer bone metastases. Xgeva is also used to treat giant cell tumor of the bone. This medicine may be used for other purposes; ask your health care provider or pharmacist if you have questions. COMMON BRAND NAME(S): Prolia, XGEVA What should I tell my health care provider before I take this medicine? They need to know if you have any of these conditions:  dental disease  having surgery or tooth extraction  infection  kidney disease  low levels of calcium or Vitamin D in the blood  malnutrition  on hemodialysis  skin conditions or sensitivity  thyroid or parathyroid disease  an unusual reaction to denosumab, other medicines, foods, dyes, or preservatives  pregnant or trying to get pregnant  breast-feeding How should I use this medicine? This medicine is for injection under the skin. It is given by a health care professional in a hospital or clinic setting. A special MedGuide will be given to you before each treatment. Be sure to read this information carefully each time. For Prolia, talk to your pediatrician regarding the use of this medicine in children. Special care may be needed. For Xgeva, talk to your pediatrician regarding the use of this medicine in children. While this drug may be prescribed for children as young as 13 years for selected conditions, precautions do apply. Overdosage: If you think you have taken too much of this medicine contact a poison control center or emergency room at once. NOTE: This medicine is only for you. Do not share this medicine with others. What if I miss a dose? It is  important not to miss your dose. Call your doctor or health care professional if you are unable to keep an appointment. What may interact with this medicine? Do not take this medicine with any of the following medications:  other medicines containing denosumab This medicine may also interact with the following medications:  medicines that lower your chance of fighting infection  steroid medicines like prednisone or cortisone This list may not describe all possible interactions. Give your health care provider a list of all the medicines, herbs, non-prescription drugs, or dietary supplements you use. Also tell them if you smoke, drink alcohol, or use illegal drugs. Some items may interact with your medicine. What should I watch for while using this medicine? Visit your doctor or health care professional for regular checks on your progress. Your doctor or health care professional may order blood tests and other tests to see how you are doing. Call your doctor or health care professional for advice if you get a fever, chills or sore throat, or other symptoms of a cold or flu. Do not treat yourself. This drug may decrease your body's ability to fight infection. Try to avoid being around people who are sick. You should make sure you get enough calcium and vitamin D while you are taking this medicine, unless your doctor tells you not to. Discuss the foods you eat and the vitamins you take with your health care professional. See your dentist regularly. Brush and floss your teeth as directed. Before you have any dental work done, tell your dentist you are   receiving this medicine. Do not become pregnant while taking this medicine or for 5 months after stopping it. Talk with your doctor or health care professional about your birth control options while taking this medicine. Women should inform their doctor if they wish to become pregnant or think they might be pregnant. There is a potential for serious side  effects to an unborn child. Talk to your health care professional or pharmacist for more information. What side effects may I notice from receiving this medicine? Side effects that you should report to your doctor or health care professional as soon as possible:  allergic reactions like skin rash, itching or hives, swelling of the face, lips, or tongue  bone pain  breathing problems  dizziness  jaw pain, especially after dental work  redness, blistering, peeling of the skin  signs and symptoms of infection like fever or chills; cough; sore throat; pain or trouble passing urine  signs of low calcium like fast heartbeat, muscle cramps or muscle pain; pain, tingling, numbness in the hands or feet; seizures  unusual bleeding or bruising  unusually weak or tired Side effects that usually do not require medical attention (report to your doctor or health care professional if they continue or are bothersome):  constipation  diarrhea  headache  joint pain  loss of appetite  muscle pain  runny nose  tiredness  upset stomach This list may not describe all possible side effects. Call your doctor for medical advice about side effects. You may report side effects to FDA at 1-800-FDA-1088. Where should I keep my medicine? This medicine is only given in a clinic, doctor's office, or other health care setting and will not be stored at home. NOTE: This sheet is a summary. It may not cover all possible information. If you have questions about this medicine, talk to your doctor, pharmacist, or health care provider.  2020 Elsevier/Gold Standard (2017-11-09 16:10:44)

## 2019-11-25 ENCOUNTER — Encounter: Payer: Self-pay | Admitting: Oncology

## 2019-11-25 NOTE — Progress Notes (Signed)
Pt called w/ financial concerns for Prolia.  I informed her of copay assistance w/ [employer] First Step.  Pt wanted to apply so she gave me consent to apply in her behalf.  She was approved for $1,500 per calendar year.  There is no out-of-pocket cost for first dose or cycle; $25 out-of-pocket cost for subsequent dose or cycle.

## 2020-05-05 ENCOUNTER — Other Ambulatory Visit: Payer: Self-pay | Admitting: Oncology

## 2020-05-05 DIAGNOSIS — C50511 Malignant neoplasm of lower-outer quadrant of right female breast: Secondary | ICD-10-CM

## 2020-05-05 DIAGNOSIS — Z170 Estrogen receptor positive status [ER+]: Secondary | ICD-10-CM

## 2020-05-07 ENCOUNTER — Other Ambulatory Visit: Payer: Self-pay

## 2020-05-07 ENCOUNTER — Telehealth: Payer: Self-pay

## 2020-05-07 MED ORDER — DENOSUMAB 60 MG/ML ~~LOC~~ SOSY
60.0000 mg | PREFILLED_SYRINGE | SUBCUTANEOUS | 1 refills | Status: DC
Start: 2020-05-07 — End: 2020-05-10

## 2020-05-07 NOTE — Progress Notes (Signed)
proli

## 2020-05-07 NOTE — Telephone Encounter (Signed)
This LPN spoke with pt who states she called her insurance concerning the home injections for Prolia. Per the pt, her insurance co informed her our office would need to "fight for her" which is a Peer to peer. This LPN sent a message to our nurse practitioner, Wilber Bihari regarding this and I am awaiting a response.

## 2020-05-07 NOTE — Progress Notes (Signed)
Order entered for Prolia at home Q55mos and was sent electronically, as well as faxed (conf. Received) to [facility] with PA# [id number]. Pt has been notified and is extremely displeased with this, as she has already received her first two prolia injections. Pt voices frustration, and is not mad at [place], but is angry that her insurance would "pull this crap" after she has already been in our office for 2 injections. Pt states, "this is the most absurd bull shit I have ever heard and I will call my insurance company."   This LPN voiced understanding of her frustration and advised pt to call her insurance to let them know how displeased she is about this, and if they tell her she is authorized to have it in office, she needs to provide a PA from them. Pt verbalized understanding and states she will call them and will call us back. In the meantime, this LPN will continue to work on getting prolia at home.

## 2020-05-10 ENCOUNTER — Other Ambulatory Visit: Payer: Self-pay

## 2020-05-10 MED ORDER — DENOSUMAB 60 MG/ML ~~LOC~~ SOSY: 60.0000 mg | PREFILLED_SYRINGE | SUBCUTANEOUS | 1 refills | Status: DC

## 2020-05-12 ENCOUNTER — Other Ambulatory Visit: Payer: Self-pay

## 2020-05-12 DIAGNOSIS — C50511 Malignant neoplasm of lower-outer quadrant of right female breast: Secondary | ICD-10-CM

## 2020-05-12 DIAGNOSIS — Z170 Estrogen receptor positive status [ER+]: Secondary | ICD-10-CM

## 2020-05-13 ENCOUNTER — Other Ambulatory Visit: Payer: Self-pay

## 2020-05-13 ENCOUNTER — Inpatient Hospital Stay: Payer: 59 | Attending: Oncology

## 2020-05-13 ENCOUNTER — Inpatient Hospital Stay: Payer: 59

## 2020-05-13 ENCOUNTER — Inpatient Hospital Stay (HOSPITAL_BASED_OUTPATIENT_CLINIC_OR_DEPARTMENT_OTHER): Payer: 59 | Admitting: Oncology

## 2020-05-13 VITALS — BP 121/79 | HR 100 | Temp 97.7°F | Resp 19 | Ht 68.0 in | Wt 167.6 lb

## 2020-05-13 DIAGNOSIS — C50511 Malignant neoplasm of lower-outer quadrant of right female breast: Secondary | ICD-10-CM

## 2020-05-13 DIAGNOSIS — Z170 Estrogen receptor positive status [ER+]: Secondary | ICD-10-CM

## 2020-05-13 DIAGNOSIS — M810 Age-related osteoporosis without current pathological fracture: Secondary | ICD-10-CM | POA: Insufficient documentation

## 2020-05-13 DIAGNOSIS — Z1239 Encounter for other screening for malignant neoplasm of breast: Secondary | ICD-10-CM | POA: Diagnosis not present

## 2020-05-13 DIAGNOSIS — M8580 Other specified disorders of bone density and structure, unspecified site: Secondary | ICD-10-CM | POA: Diagnosis not present

## 2020-05-13 LAB — CBC WITH DIFFERENTIAL (CANCER CENTER ONLY)
Abs Immature Granulocytes: 0.02 10*3/uL (ref 0.00–0.07)
Basophils Absolute: 0 10*3/uL (ref 0.0–0.1)
Basophils Relative: 0 %
Eosinophils Absolute: 0 10*3/uL (ref 0.0–0.5)
Eosinophils Relative: 0 %
HCT: 37.5 % (ref 36.0–46.0)
Hemoglobin: 12.3 g/dL (ref 12.0–15.0)
Immature Granulocytes: 0 %
Lymphocytes Relative: 33 %
Lymphs Abs: 1.7 10*3/uL (ref 0.7–4.0)
MCH: 31.1 pg (ref 26.0–34.0)
MCHC: 32.8 g/dL (ref 30.0–36.0)
MCV: 94.9 fL (ref 80.0–100.0)
Monocytes Absolute: 0.4 10*3/uL (ref 0.1–1.0)
Monocytes Relative: 8 %
Neutro Abs: 3.1 10*3/uL (ref 1.7–7.7)
Neutrophils Relative %: 59 %
Platelet Count: 220 10*3/uL (ref 150–400)
RBC: 3.95 MIL/uL (ref 3.87–5.11)
RDW: 13.1 % (ref 11.5–15.5)
WBC Count: 5.3 10*3/uL (ref 4.0–10.5)
nRBC: 0 % (ref 0.0–0.2)

## 2020-05-13 LAB — CMP (CANCER CENTER ONLY)
ALT: 19 U/L (ref 0–44)
AST: 20 U/L (ref 15–41)
Albumin: 4.1 g/dL (ref 3.5–5.0)
Alkaline Phosphatase: 51 U/L (ref 38–126)
Anion gap: 8 (ref 5–15)
BUN: 16 mg/dL (ref 6–20)
CO2: 28 mmol/L (ref 22–32)
Calcium: 9.3 mg/dL (ref 8.9–10.3)
Chloride: 106 mmol/L (ref 98–111)
Creatinine: 0.83 mg/dL (ref 0.44–1.00)
GFR, Estimated: >60 mL/min (ref >60)
Glucose, Bld: 96 mg/dL (ref 70–99)
Potassium: 3.6 mmol/L (ref 3.5–5.1)
Sodium: 142 mmol/L (ref 135–145)
Total Bilirubin: 0.6 mg/dL (ref 0.3–1.2)
Total Protein: 6.6 g/dL (ref 6.5–8.1)

## 2020-05-13 MED ORDER — DENOSUMAB 60 MG/ML ~~LOC~~ SOSY
60.0000 mg | PREFILLED_SYRINGE | Freq: Once | SUBCUTANEOUS | Status: AC
  Administered 2020-05-13: 60 mg via SUBCUTANEOUS

## 2020-05-13 MED ORDER — DENOSUMAB 60 MG/ML ~~LOC~~ SOSY
PREFILLED_SYRINGE | SUBCUTANEOUS | Status: AC
  Filled 2020-05-13: qty 1

## 2020-05-13 NOTE — Progress Notes (Signed)
[facility]  [phone] [phone]   ID: Michele Alvarez DOB: July 11, 1961  MR#: [id number]  XQJ#:[id number]  Patient Care Team: Jonathon Jordan, MD as PCP - General (Family Medicine) Avon Gully, NP as Nurse Practitioner (Obstetrics and Gynecology) [name], MD as Consulting Physician (General Surgery) Klara Stjames, Virgie Dad, MD as Consulting Physician (Oncology) Eppie Gibson, MD as Attending Physician (Radiation Oncology) Jovita Gamma, MD as Consulting Physician (Neurosurgery) Delice Bison, Charlestine Massed, NP as Nurse Practitioner (Hematology and Oncology) OTHER MD:  CHIEF COMPLAINT: Estrogen receptor positive breast cancer  CURRENT TREATMENT: Anastrozole   INTERVAL HISTORY: Michele Alvarez returns today for follow-up of her estrogen receptor positive breast cancer.    She continues on anastrozole, with good tolerance. She reports minimal hot flashes and minimal vaginal dryness.   Her most recent bone density screening on 09/03/2018 showed a T-score of -2.5, which is considered osteoporotic.   Since her last visit, she underwent bilateral diagnostic mammography with tomography at [facility] on 09/08/2019 showing: breast density category C; no evidence of malignancy in either breast.   REVIEW OF SYSTEMS: Michele Alvarez fell while helping her brother work in his yard and fractured her left wrist.  She had surgery under Dr. Amedeo Plenty and did very well except she says the pain was really quite bad.  She has good mobility and a fair grip but she is left-handed and this is a challenge.  Aside from that a detailed review of systems today was noncontributory  COVID 19 VACCINATION STATUS: Status post [place] x2, last dose April 2021  BREAST CANCER HISTORY: From the original intake note:  "Michele Alvarez" had routine screening mammography at [facility] 08/07/2015. There was an area of calcification in the right breast 2:00 position. There was also an area of calcification in the  left breast 7:00 position. She was recalled for bilateral diagnostic mammography 08/12/2015. The left breast calcifications were felt to be likely benign and repeat mammography in 6 months was suggested.  On the right, breast density was category D. There was a 1.2 cm irregular mass at the 6:00, inframammary position and also suspicious calcifications. Adding those together the area measured 2.5 cm. Ultrasound of the right breast on the same day found a 1.3 cm lobulated mass in the right breast at the 6:00 position. There was a 1.7 cm axillary lymph node with a fatty hilum.   On 08/30/2015 the patient underwent biopsy of the breast mass, the abnormal lymph node, and the area of calcifications. The calcifications ([id number]) were associated with ductal carcinoma in situ, with the prognostic panel pending. 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 The lymph node also was positive.  Note that the clip to the patient's breast mass did not deploy.  The patient's subsequent history is as detailed below   PAST MEDICAL HISTORY: Past Medical History:  Diagnosis Date   Breast cancer ([place])    Breast cancer of lower-outer quadrant of right female breast ([place]) 08/19/2015   History of radiation therapy 04/18/16- 05/30/16   Right Breast 50 Gy in 25 fractions. then boosted to 60 Gy in 5 fractions   Hot flashes    PONV (postoperative nausea and vomiting)     PAST SURGICAL HISTORY: Past Surgical History:  Procedure Laterality Date   BACK SURGERY  2007, 2014   x2, lumbar surgery used same incision for both surgeries.    BREAST LUMPECTOMY WITH RADIOACTIVE SEED AND  AXILLARY LYMPH NODE DISSECTION Right 03/08/2016   Procedure: RIGHT BREAST BRACKETED RADIOACTIVE SEED GUIDED LUMPECTOMY RIGHT AXILLARY SENTINEL LYMPH NODE BIOPSY AND RIGHT RADIOACTIVE SEED TARGETED AXILLARY NODE DISSECTION;   Surgeon: [name], MD;  Location: [facility];  Service: General;  Laterality: Right;   NECK SURGERY  09-01-1999   PORT-A-CATH REMOVAL Right 03/08/2016   Procedure: REMOVAL PORT-A-CATH;  Surgeon: [name], MD;  Location: [facility];  Service: General;  Laterality: Right;   PORTACATH PLACEMENT Right 09/09/2015   Procedure: INSERTION PORT-A-CATH WITH [place] ;  Surgeon: [name], MD;  Location: [facility];  Service: General;  Laterality: Right;    FAMILY HISTORY Family History  Problem Relation Age of Onset   Parkinson's disease Mother    COPD Father    Cancer Neg Hx    The patient's father died in 08-31-14 at the age of 74. He had significant COPD. The patient's mother died at the age of 68 from Parkinson's disease. The patient has 1 brother, 1 sister. There is no cancer history in the family to her knowledge.   GYNECOLOGIC HISTORY:  No LMP recorded. Patient is postmenopausal. Menarche age 57. She has GI asked P0. She stopped having periods in January 2014. She never took hormone replacement or oral contraceptives.   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 She lives with her partner, [address], and 2 cats.  Michele Alvarez runs a Paramedic.    ADVANCED DIRECTIVES: She has named her partner, Michele Alvarez, as her [place]: Social History   Tobacco Use   Smoking status: Never Smoker   Smokeless tobacco: Never Used  Substance Use Topics   Alcohol use: Yes    Comment: social, she drinks one alchohol drink daily.    Drug use: No     Colonoscopy: Never   PAP:  Bone density: 08-31-18, -2.5  Lipid panel:  Allergies  Allergen Reactions   Latex Other (See Comments)    Fever blisters   Medical Adhesive Remover Dermatitis    Reaction to dermabond, do not use    Current Outpatient Medications  Medication Sig Dispense Refill   anastrozole (ARIMIDEX) 1 MG tablet Take 1 tablet by mouth once  daily 90 tablet 3   cholecalciferol (VITAMIN D) 1000 units tablet Take 1 tablet (1,000 Units total) by mouth daily. (Patient not taking: Reported on 02/23/2017) 100 tablet 3   denosumab (PROLIA) 60 MG/ML SOSY injection Inject 60 mg into the skin every 6 (six) months. 1 mL 1   Multiple Vitamin (MULTIVITAMIN) tablet Take 1 tablet by mouth daily.     No current facility-administered medications for this visit.    OBJECTIVE: White woman who appears younger than stated age   55:   05/13/20 1426  BP: 121/79  Pulse: 100  Resp: 19  Temp: 97.7 F (36.5 C)  SpO2: 96%     Body mass index is 25.48 kg/m.    ECOG FS:1 - Symptomatic but completely ambulatory  Sclerae unicteric, EOMs intact Wearing a mask No cervical or supraclavicular adenopathy Lungs no rales or rhonchi Heart regular rate and rhythm Abd soft, nontender, positive bowel sounds MSK no focal spinal tenderness, no upper extremity lymphedema; good range of motion left hand Neuro: nonfocal, well oriented, appropriate affect Breasts: The right breast is status post lumpectomy and radiation.  There is no evidence of disease recurrence per the left breast is benign.  Both axillae are benign.  LAB RESULTS:  CMP     Component Value Date/Time   NA 144 11/24/2019 1436   NA 142 06/18/2017 1441   K 3.9 11/24/2019 1436   K 4.0 06/18/2017 1441   CL 105 11/24/2019 1436   CO2 29 11/24/2019 1436   CO2 28 06/18/2017 1441   GLUCOSE 99 11/24/2019 1436   GLUCOSE 106 06/18/2017 1441   BUN 19 11/24/2019 1436   BUN 12.8 06/18/2017 1441   CREATININE 0.85 11/24/2019 1436   CREATININE 0.9 06/18/2017 1441   CALCIUM 9.2 11/24/2019 1436   CALCIUM 9.5 06/18/2017 1441   PROT 6.6 11/24/2019 1436   PROT 6.9 06/18/2017 1441   ALBUMIN 4.1 11/24/2019 1436   ALBUMIN 4.2 06/18/2017 1441   AST 22 11/24/2019 1436   AST 22 06/18/2017 1441   ALT 19 11/24/2019 1436   ALT 21 06/18/2017 1441   ALKPHOS 51 11/24/2019 1436   ALKPHOS 85 06/18/2017  1441   BILITOT 0.6 11/24/2019 1436   BILITOT 0.34 06/18/2017 1441   GFRNONAA >60 11/24/2019 1436   GFRAA >60 11/24/2019 1436    INo results found for: SPEP, UPEP  Lab Results  Component Value Date   WBC 5.3 05/13/2020   NEUTROABS 3.1 05/13/2020   HGB 12.3 05/13/2020   HCT 37.5 05/13/2020   MCV 94.9 05/13/2020   PLT 220 05/13/2020      Chemistry      Component Value Date/Time   NA 144 11/24/2019 1436   NA 142 06/18/2017 1441   K 3.9 11/24/2019 1436   K 4.0 06/18/2017 1441   CL 105 11/24/2019 1436   CO2 29 11/24/2019 1436   CO2 28 06/18/2017 1441   BUN 19 11/24/2019 1436   BUN 12.8 06/18/2017 1441   CREATININE 0.85 11/24/2019 1436   CREATININE 0.9 06/18/2017 1441      Component Value Date/Time   CALCIUM 9.2 11/24/2019 1436   CALCIUM 9.5 06/18/2017 1441   ALKPHOS 51 11/24/2019 1436   ALKPHOS 85 06/18/2017 1441   AST 22 11/24/2019 1436   AST 22 06/18/2017 1441   ALT 19 11/24/2019 1436   ALT 21 06/18/2017 1441   BILITOT 0.6 11/24/2019 1436   BILITOT 0.34 06/18/2017 1441       No results found for: LABCA2  No components found for: LABCA125  No results for input(s): INR in the last 168 hours.  Urinalysis No results found for: COLORURINE, APPEARANCEUR, LABSPEC, PHURINE, GLUCOSEU, HGBUR, BILIRUBINUR, KETONESUR, PROTEINUR, UROBILINOGEN, NITRITE, LEUKOCYTESUR  ELIGIBLE FOR AVAILABLE RESEARCH PROTOCOL: no   STUDIES: No results found.    ASSESSMENT: 59 y.o. 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, with an MIB-1 of 80%  (1) neoadjuvant chemotherapy started 09/16/2015 consisting of doxorubicin and cyclophosphamide in dose dense fashion 4, completed 10/28/2015, followed by paclitaxel weekly 12  started 11/19/2015, completed 02/11/2016  (2) Status  post right lumpectomy and sentinel lymph node sampling 03/08/2016 for a residual ypT1b ypN0 invasive ductal carcinoma, grade 2, with focally positive margins not requiring further excision  (3) adjuvant radiation 04/18/16 - 05/30/16 Site/dose:Total dose 60 Gy in 30 fx  (4) anastrozole started 07/17/2016  (a) bone density 08/25/2016 at [facility] showed a T score of -2.4  (b) bone density February 2020 showed osteoporosis with a T score of -2.5  (c) denosumab/Prolia started 05/16/2019  (5) participating in DCP-001 study  PLAN: Michele Alvarez is now just a little over 4 years out from definitive surgery for breast cancer with no evidence of disease recurrence.  This is very favorable.  She is tolerating anastrozole well.  The plan is to continue it a minimum of 5 years.  She is receiving denosumab/Prolia today.  This will be her third of 4 doses.  When she has her mammography February 2022 at [facility] as she will have a repeat bone density at that time  We discussed fall precaution issues but actually this was pretty much of a frequent fall and does not indicate a balance problem per se  She knows to call for any other issues that may develop before the next visit  Total encounter time 25 minutes.*  Cosby Proby, Virgie Dad, MD  05/13/20 2:43 PM Medical Oncology and Hematology [facility] [place], [place] [address] Tel. [phone]    Fax. [phone]    I, Wilburn Mylar am acting as scribe for Dr. Virgie Dad. Lissa Rowles.  I, Lurline Del MD, have reviewed the above documentation for accuracy and completeness, and I agree with the above.   *Total Encounter Time as defined by the Centers for Medicare and Medicaid Services includes, in addition to the face-to-face time of a patient visit (documented in the note above) non-face-to-face time: obtaining and reviewing outside history, ordering and reviewing medications, tests or procedures, care coordination  (communications with other health care professionals or caregivers) and documentation in the medical record.

## 2020-05-14 ENCOUNTER — Telehealth: Payer: Self-pay | Admitting: Oncology

## 2020-05-14 NOTE — Telephone Encounter (Signed)
Scheduled per 10/28 los. Spoke with pt, confirmed 4/19 appt

## 2020-05-18 ENCOUNTER — Encounter: Payer: Self-pay | Admitting: Oncology

## 2020-05-18 ENCOUNTER — Telehealth: Payer: Self-pay

## 2020-05-18 NOTE — Telephone Encounter (Signed)
This LPN called pt to follow up on [facility] account. Pt states she has not heard from  [employer] and was able to receive Prolia in office 05/13/20 and wonders if her last shot in April will be covered in office as well. This LPN sent a message to pharmacy team to inquire and I will follow up with [facility] if needed. Pt verbalized thanks and understanding.

## 2020-05-18 NOTE — Telephone Encounter (Signed)
----- Message from Rennis Harding, RN sent at 05/17/2020  9:21 AM EDT ----- Regarding: RE: P2P Prolia? Michele Alvarez,   Just wanted to follow up with this message today.  I know you have submitted the forms for this patient's Prolia at home injection on 10/22.  Have you already followed up on this, from your last message?    I just didn't want to duplicate anything.   It looks like the patient received Prolia here in the office on 10/28.   ----- Message ----- From: Lennie Odor, LPN Sent: 24/23/5361   8:46 AM EDT To: Gaspar Bidding, Neysa Hotter, [facility], # Subject: RE: P2P Prolia?                                Hi all,  This all was submitted to [facility] 10/22. I wanted to make sure it was submitted despite the pt filing the complaint with her insurance to make sure she would still get it either way. I will f/u on this today and find out what is going on.  Thanks, Mickel Baas ----- Message ----- From: Blenda Peals Sent: 05/10/2020   3:48 PM EDT To: Gaspar Bidding, Neysa Hotter, [facility], # Subject: RE: P2P Prolia?                                Brandi obtained auth through [facility]  Per [employer], auth# [id number]. Valid 05/04/20 - 05/04/21. [id number] - Prolia.  ----- Message ----- From: Rennis Harding, RN Sent: 05/10/2020   2:22 PM EDT To: Gaspar Bidding, Neysa Hotter, [facility], # Subject: RE: P2P Prolia?                                I'm happy to work on this Prolia injection at home for the patient.  Can anyone update on what speciality pharmacy needs to be notified?    Michele Alvarez  ----- Message ----- From: Gardenia Phlegm, NP Sent: 05/10/2020   1:09 PM EDT To: Rennis Harding, RN Subject: FW: P2P Prolia?                                 ----- Message ----- From: Blenda Peals Sent: 05/10/2020  12:56 PM EDT To: Gaspar Bidding, Neysa Hotter, [facility], # Subject: RE: P2P Prolia?                                Her [employer] policy is locked into specialty pharmacy. Prolia will  not be covered to be done at the facility. If she has this at the facility, [employer]c will not cover it and she will be stuck with the bill.    Thanks!  ----- Message ----- From: Gardenia Phlegm, NP Sent: 05/10/2020  12:40 PM EDT To: Gaspar Bidding, Neysa Hotter, [facility], # Subject: RE: P2P Prolia?                                What needs to happen per the below from Mickel Baas? ----- Message ----- From: Neysa Hotter, [facility] Sent: 05/10/2020  11:34 AM EDT To: Gardenia Phlegm,  NP, Lennie Odor, LPN Subject: RE: B1Q Prolia?                                Hi Michele Alvarez,  We do not know anything about this.   I would clarify this information with [employer].  Michele Alvarez with the PA team may be able to provide additional information as well.  Thanks, Theadora Rama ----- Message ----- From: Gardenia Phlegm, NP Sent: 05/10/2020   8:32 AM EDT To: [facility] Subject: FW: P2P Prolia?                                Do y'all know anything about the below? ----- Message ----- From: Lennie Odor, LPN Sent: 94/50/3888  12:31 PM EDT To: Elwin Mocha, # Subject: P2P Prolia?                                    Hi Michele Alvarez,  I have already sent an inbasket regarding this pt, but she called [employer] to file a complaint about doing her Prolia at home and they told her that her Dr office can call and "fight for her" I am assuming this is a [employer] cabin crew. They told the pt we would have to do this in order for her to have the injection here in office. Is this something we are able tot do? Please let me know.  Thanks, LG

## 2020-05-18 NOTE — Telephone Encounter (Signed)
This LPN called pt to F/U on [facility] account to receive Prolia at home. Pt did not answer. LVM for pt to return call to [phone].

## 2020-09-09 ENCOUNTER — Encounter: Payer: Self-pay | Admitting: Oncology

## 2020-11-01 ENCOUNTER — Other Ambulatory Visit: Payer: Self-pay

## 2020-11-01 DIAGNOSIS — C50511 Malignant neoplasm of lower-outer quadrant of right female breast: Secondary | ICD-10-CM

## 2020-11-01 NOTE — Progress Notes (Signed)
[facility]  [phone] [phone]   ID: Michele Alvarez DOB: 1961/05/14  MR#: [id number]  YQI#:[id number]  Patient Care Team: Jonathon Jordan, MD as PCP - General (Family Medicine) Avon Gully, NP as Nurse Practitioner (Obstetrics and Gynecology) [name], MD as Consulting Physician (General Surgery) Kasi Lasky, Virgie Dad, MD as Consulting Physician (Oncology) Eppie Gibson, MD as Attending Physician (Radiation Oncology) Jovita Gamma, MD as Consulting Physician (Neurosurgery) Delice Bison, Charlestine Massed, NP as Nurse Practitioner (Hematology and Oncology) Roseanne Kaufman, MD as Consulting Physician (Orthopedic Surgery) OTHER MD:  CHIEF COMPLAINT: Estrogen receptor positive breast cancer  CURRENT TREATMENT: Anastrozole   INTERVAL HISTORY: Michele Alvarez returns today for follow-up of her estrogen receptor positive breast cancer.    She continues on anastrozole, with good tolerance. She reports minimal hot flashes and minimal vaginal dryness.   She will receive her fourth dose of Prolia today.  She tolerates this with no side effects that she is aware of.  Since her last visit, she presented for routine mammography and also noted a new palpable lump along the anterior right axilla, "in the crook of the right shoulder". She underwent bilateral diagnostic mammography with tomography at [facility] on 09/08/2020 showing: breast density category C; no evidence of malignancy in either breast; ultrasonography of the mass in the right shoulder area found a benign 1.8 cm oval lipoma in anterior right axilla.   She also underwent repeat bone density screening the same day showing a T-score of -2.1, which is considered osteopenic.  This is improved from the prior -2.4   REVIEW OF SYSTEMS: Michele Alvarez is working full-time.  She gets very little time off.  She does not find it stressful though because when she leaves work she is done with it.  She is concerned about  [name] who is having blood pressure and kidney issues.  For exercise best basically stays on her feet at work.  She is only just now beginning to take some walks outside.  Detailed review of systems today was otherwise stable   COVID 19 VACCINATION STATUS: Status post Pfizer x2, and status post booster October 2021   BREAST CANCER HISTORY: From the original intake note:  "Michele Alvarez" had routine screening mammography at [facility] 08/07/2015. There was an area of calcification in the right breast 2:00 position. There was also an area of calcification in the left breast 7:00 position. She was recalled for bilateral diagnostic mammography 08/12/2015. The left breast calcifications were felt to be likely benign and repeat mammography in 6 months was suggested.  On the right, breast density was category D. There was a 1.2 cm irregular mass at the 6:00, inframammary position and also suspicious calcifications. Adding those together the area measured 2.5 cm. Ultrasound of the right breast on the same day found a 1.3 cm lobulated mass in the right breast at the 6:00 position. There was a 1.7 cm axillary lymph node with a fatty hilum.   On 08/30/2015 the patient underwent biopsy of the breast mass, the abnormal lymph node, and the area of calcifications. The calcifications ([id number]) were associated with ductal carcinoma in situ, with the prognostic panel pending. 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 The lymph node also was positive.  Note that the clip to the patient's breast mass did not deploy.  The patient's subsequent history is as detailed below   PAST MEDICAL HISTORY: Past Medical History:  Diagnosis Date  . Breast cancer ([place])   . Breast cancer of lower-outer quadrant of right female breast ([place]) 08/19/2015  . History of radiation therapy 04/18/16- 05/30/16   Right Breast 50 Gy in 25  fractions. then boosted to 60 Gy in 5 fractions  . Hot flashes   . PONV (postoperative nausea and vomiting)     PAST SURGICAL HISTORY: Past Surgical History:  Procedure Laterality Date  . BACK SURGERY  2007, 2014   x2, lumbar surgery used same incision for both surgeries.   [facility] Kitchen BREAST LUMPECTOMY WITH RADIOACTIVE SEED AND AXILLARY LYMPH NODE DISSECTION Right 03/08/2016   Procedure: RIGHT BREAST BRACKETED RADIOACTIVE SEED GUIDED LUMPECTOMY RIGHT AXILLARY SENTINEL LYMPH NODE BIOPSY AND RIGHT RADIOACTIVE SEED TARGETED AXILLARY NODE DISSECTION;  Surgeon: [name], MD;  Location: [place];  Service: General;  Laterality: Right;  . NECK SURGERY  Sep 19, 1999  . PORT-A-CATH REMOVAL Right 03/08/2016   Procedure: REMOVAL PORT-A-CATH;  Surgeon: [name], MD;  Location: [place];  Service: General;  Laterality: Right;  . PORTACATH PLACEMENT Right 09/09/2015   Procedure: INSERTION PORT-A-CATH WITH [place] ;  Surgeon: [name], MD;  Location: [place];  Service: General;  Laterality: Right;    FAMILY HISTORY Family History  Problem Relation Age of Onset  . Parkinson's disease Mother   . COPD Father   . Cancer Neg Hx   The patient's father died in 09-18-2014 at the age of 62. He had significant COPD. The patient's mother died at the age of 19 from Parkinson's disease. The patient has 1 brother, 1 sister. There is no cancer history in the family to her knowledge.   GYNECOLOGIC HISTORY:  No LMP recorded. Patient is postmenopausal. Menarche age 84. She has GI asked P0. She stopped having periods in January 2014. She never took hormone replacement or oral contraceptives.   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 She lives with her partner, [address], and 2 cats.  [name] runs a Paramedic.    ADVANCED DIRECTIVES: She has named her partner, [name], as her [place]: Social History   Tobacco Use  . Smoking  status: Never Smoker  . Smokeless tobacco: Never Used  Substance Use Topics  . Alcohol use: Yes    Comment: social, she drinks one alchohol drink daily.   . Drug use: No     Colonoscopy: Never   PAP:  Bone density: 2018-09-18, -2.5  Lipid panel:  Allergies  Allergen Reactions  . Latex Other (See Comments)    Fever blisters  . Medical Adhesive Remover Dermatitis    Reaction to dermabond, do not use    Current Outpatient Medications  Medication Sig Dispense Refill  . anastrozole (ARIMIDEX) 1 MG tablet Take 1 tablet by mouth once daily 90 tablet 3  . cholecalciferol (VITAMIN D) 1000 units tablet Take 1 tablet (1,000 Units total) by mouth daily. (Patient not taking: Reported on 02/23/2017) 100 tablet 3  . denosumab (PROLIA) 60 MG/ML SOSY injection Inject 60 mg into the skin every 6 (six) months. 1 mL 1  . Multiple Vitamin (MULTIVITAMIN) tablet Take 1 tablet by mouth daily.     No current facility-administered medications for this visit.    OBJECTIVE: White woman who appears younger than stated age  30:   11/02/20 1414  BP: 125/73  Pulse: 83  Resp: 18  Temp: (!) 97.3 F (36.3 C)  SpO2: 99%     Body  mass index is 25.51 kg/m.    ECOG FS:1 - Symptomatic but completely ambulatory  Sclerae unicteric, EOMs intact Wearing a mask No cervical or supraclavicular adenopathy Lungs no rales or rhonchi Heart regular rate and rhythm Abd soft, nontender, positive bowel sounds MSK no focal spinal tenderness, no upper extremity lymphedema Neuro: nonfocal, well oriented, appropriate affect Breasts: The right breast is status postlumpectomy and radiation.  There is no evidence of local recurrence.  Left breast is benign.  Both axillae are benign. Skin: In the angle of the right shoulder anteriorly there is an easily palpable 2 cm fatty mass consistent with lipoma  LAB RESULTS:  CMP     Component Value Date/Time   NA 144 11/02/2020 1347   NA 142 06/18/2017 1441   K 4.4 11/02/2020  1347   K 4.0 06/18/2017 1441   CL 106 11/02/2020 1347   CO2 28 11/02/2020 1347   CO2 28 06/18/2017 1441   GLUCOSE 118 (H) 11/02/2020 1347   GLUCOSE 106 06/18/2017 1441   BUN 13 11/02/2020 1347   BUN 12.8 06/18/2017 1441   CREATININE 0.84 11/02/2020 1347   CREATININE 0.9 06/18/2017 1441   CALCIUM 9.5 11/02/2020 1347   CALCIUM 9.5 06/18/2017 1441   PROT 7.0 11/02/2020 1347   PROT 6.9 06/18/2017 1441   ALBUMIN 4.5 11/02/2020 1347   ALBUMIN 4.2 06/18/2017 1441   AST 21 11/02/2020 1347   AST 22 06/18/2017 1441   ALT 18 11/02/2020 1347   ALT 21 06/18/2017 1441   ALKPHOS 54 11/02/2020 1347   ALKPHOS 85 06/18/2017 1441   BILITOT 0.6 11/02/2020 1347   BILITOT 0.34 06/18/2017 1441   GFRNONAA >60 11/02/2020 1347   GFRAA >60 11/24/2019 1436    INo results found for: SPEP, UPEP  Lab Results  Component Value Date   WBC 5.1 11/02/2020   NEUTROABS 3.1 11/02/2020   HGB 12.7 11/02/2020   HCT 38.5 11/02/2020   MCV 94.4 11/02/2020   PLT 235 11/02/2020      Chemistry      Component Value Date/Time   NA 144 11/02/2020 1347   NA 142 06/18/2017 1441   K 4.4 11/02/2020 1347   K 4.0 06/18/2017 1441   CL 106 11/02/2020 1347   CO2 28 11/02/2020 1347   CO2 28 06/18/2017 1441   BUN 13 11/02/2020 1347   BUN 12.8 06/18/2017 1441   CREATININE 0.84 11/02/2020 1347   CREATININE 0.9 06/18/2017 1441      Component Value Date/Time   CALCIUM 9.5 11/02/2020 1347   CALCIUM 9.5 06/18/2017 1441   ALKPHOS 54 11/02/2020 1347   ALKPHOS 85 06/18/2017 1441   AST 21 11/02/2020 1347   AST 22 06/18/2017 1441   ALT 18 11/02/2020 1347   ALT 21 06/18/2017 1441   BILITOT 0.6 11/02/2020 1347   BILITOT 0.34 06/18/2017 1441       No results found for: LABCA2  No components found for: LABCA125  No results for input(s): INR in the last 168 hours.  Urinalysis No results found for: COLORURINE, APPEARANCEUR, LABSPEC, PHURINE, GLUCOSEU, HGBUR, BILIRUBINUR, KETONESUR, PROTEINUR, UROBILINOGEN, NITRITE,  LEUKOCYTESUR  ELIGIBLE FOR AVAILABLE RESEARCH PROTOCOL: no   STUDIES: No results found.    ASSESSMENT: 60 y.o. 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, with an MIB-1 of 80%  (1) neoadjuvant chemotherapy  started 09/16/2015 consisting of doxorubicin and cyclophosphamide in dose dense fashion 4, completed 10/28/2015, followed by paclitaxel weekly 12  started 11/19/2015, completed 02/11/2016  (2) Status post right lumpectomy and sentinel lymph node sampling 03/08/2016 for a residual ypT1b ypN0 invasive ductal carcinoma, grade 2, with focally positive margins not requiring further excision  (3) adjuvant radiation 04/18/16 - 05/30/16 Site/dose:Total dose 60 Gy in 30 fx  (4) anastrozole started 07/17/2016  (a) bone density 08/25/2016 at [facility] showed a T score of -2.4  (b) bone density February 2020 showed osteoporosis with a T score of -2.5  (c) denosumab/Prolia started 05/16/2019  (d) bone density 09/08/2020 at [facility] showed a T score of -2.1.  (5) participating in DCP-001 study    PLAN: Michele Alvarez is close to 5 years out from definitive surgery for her breast cancer with no evidence of disease recurrence.  This is very favorable.  She is tolerating anastrozole well and the plan is to continue that through December of this year.  She will see me 1 less time in October.  She will have Prolia 1 last time then.  That will be her "graduation visit".  I encouraged her to exercise a little more outdoors now that the weather has finally improved  She knows to call for any other issue that may develop before the next visit.  Total encounter time 25 minutes.*   Jashan Cotten, Virgie Dad, MD  11/02/20 2:38 PM Medical Oncology and Hematology [facility] [place], [place] [address] Tel. [phone]    Fax. [phone]    I, Wilburn Mylar am acting as scribe for Dr. Virgie Dad. Kyal Arts.  I, Lurline Del MD, have reviewed the above documentation for accuracy and completeness, and I agree with the above.   *Total Encounter Time as defined by the Centers for Medicare and Medicaid Services includes, in addition to the face-to-face time of a patient visit (documented in the note above) non-face-to-face time: obtaining and reviewing outside history, ordering and reviewing medications, tests or procedures, care coordination (communications with other health care professionals or caregivers) and documentation in the medical record.

## 2020-11-02 ENCOUNTER — Inpatient Hospital Stay: Payer: 59

## 2020-11-02 ENCOUNTER — Inpatient Hospital Stay: Payer: 59 | Attending: Oncology | Admitting: Oncology

## 2020-11-02 ENCOUNTER — Other Ambulatory Visit: Payer: Self-pay

## 2020-11-02 VITALS — BP 125/73 | HR 83 | Temp 97.3°F | Resp 18 | Ht 68.0 in | Wt 167.8 lb

## 2020-11-02 DIAGNOSIS — C50511 Malignant neoplasm of lower-outer quadrant of right female breast: Secondary | ICD-10-CM

## 2020-11-02 DIAGNOSIS — C773 Secondary and unspecified malignant neoplasm of axilla and upper limb lymph nodes: Secondary | ICD-10-CM | POA: Insufficient documentation

## 2020-11-02 DIAGNOSIS — Z170 Estrogen receptor positive status [ER+]: Secondary | ICD-10-CM | POA: Diagnosis not present

## 2020-11-02 DIAGNOSIS — M8580 Other specified disorders of bone density and structure, unspecified site: Secondary | ICD-10-CM

## 2020-11-02 DIAGNOSIS — D1721 Benign lipomatous neoplasm of skin and subcutaneous tissue of right arm: Secondary | ICD-10-CM | POA: Diagnosis not present

## 2020-11-02 DIAGNOSIS — M810 Age-related osteoporosis without current pathological fracture: Secondary | ICD-10-CM | POA: Insufficient documentation

## 2020-11-02 LAB — CBC WITH DIFFERENTIAL (CANCER CENTER ONLY)
Abs Immature Granulocytes: 0.01 10*3/uL (ref 0.00–0.07)
Basophils Absolute: 0 10*3/uL (ref 0.0–0.1)
Basophils Relative: 1 %
Eosinophils Absolute: 0 10*3/uL (ref 0.0–0.5)
Eosinophils Relative: 1 %
HCT: 38.5 % (ref 36.0–46.0)
Hemoglobin: 12.7 g/dL (ref 12.0–15.0)
Immature Granulocytes: 0 %
Lymphocytes Relative: 32 %
Lymphs Abs: 1.6 10*3/uL (ref 0.7–4.0)
MCH: 31.1 pg (ref 26.0–34.0)
MCHC: 33 g/dL (ref 30.0–36.0)
MCV: 94.4 fL (ref 80.0–100.0)
Monocytes Absolute: 0.3 10*3/uL (ref 0.1–1.0)
Monocytes Relative: 7 %
Neutro Abs: 3.1 10*3/uL (ref 1.7–7.7)
Neutrophils Relative %: 59 %
Platelet Count: 235 10*3/uL (ref 150–400)
RBC: 4.08 MIL/uL (ref 3.87–5.11)
RDW: 12.6 % (ref 11.5–15.5)
WBC Count: 5.1 10*3/uL (ref 4.0–10.5)
nRBC: 0 % (ref 0.0–0.2)

## 2020-11-02 LAB — CMP (CANCER CENTER ONLY)
ALT: 18 U/L (ref 0–44)
AST: 21 U/L (ref 15–41)
Albumin: 4.5 g/dL (ref 3.5–5.0)
Alkaline Phosphatase: 54 U/L (ref 38–126)
Anion gap: 10 (ref 5–15)
BUN: 13 mg/dL (ref 6–20)
CO2: 28 mmol/L (ref 22–32)
Calcium: 9.5 mg/dL (ref 8.9–10.3)
Chloride: 106 mmol/L (ref 98–111)
Creatinine: 0.84 mg/dL (ref 0.44–1.00)
GFR, Estimated: >60 mL/min (ref >60)
Glucose, Bld: 118 mg/dL — ABNORMAL HIGH (ref 70–99)
Potassium: 4.4 mmol/L (ref 3.5–5.1)
Sodium: 144 mmol/L (ref 135–145)
Total Bilirubin: 0.6 mg/dL (ref 0.3–1.2)
Total Protein: 7 g/dL (ref 6.5–8.1)

## 2020-11-02 MED ORDER — DENOSUMAB 60 MG/ML ~~LOC~~ SOSY: 60.0000 mg | PREFILLED_SYRINGE | Freq: Once | SUBCUTANEOUS | Status: DC

## 2020-11-02 MED ORDER — DENOSUMAB 60 MG/ML ~~LOC~~ SOSY
PREFILLED_SYRINGE | SUBCUTANEOUS | Status: AC
  Filled 2020-11-02: qty 1

## 2020-11-03 ENCOUNTER — Telehealth: Payer: Self-pay

## 2020-11-03 NOTE — Telephone Encounter (Signed)
RN returned call.  Voicemail left for call back.  

## 2020-11-05 ENCOUNTER — Telehealth: Payer: Self-pay | Admitting: Oncology

## 2020-11-05 NOTE — Telephone Encounter (Signed)
Scheduled appt per 4/22 sch msg. Pt aware.  

## 2020-11-09 ENCOUNTER — Inpatient Hospital Stay: Payer: 59

## 2020-11-09 ENCOUNTER — Other Ambulatory Visit: Payer: Self-pay

## 2020-11-09 VITALS — BP 110/71 | HR 72 | Resp 18

## 2020-11-09 DIAGNOSIS — C50511 Malignant neoplasm of lower-outer quadrant of right female breast: Secondary | ICD-10-CM | POA: Diagnosis not present

## 2020-11-09 MED ORDER — DENOSUMAB 60 MG/ML ~~LOC~~ SOSY
PREFILLED_SYRINGE | SUBCUTANEOUS | Status: AC
  Filled 2020-11-09: qty 1

## 2020-11-09 MED ORDER — DENOSUMAB 60 MG/ML ~~LOC~~ SOSY
60.0000 mg | PREFILLED_SYRINGE | Freq: Once | SUBCUTANEOUS | Status: AC
  Administered 2020-11-09: 60 mg via SUBCUTANEOUS

## 2020-11-09 NOTE — Patient Instructions (Signed)
Denosumab injection What is this medicine? DENOSUMAB (den oh sue mab) slows bone breakdown. Prolia is used to treat osteoporosis in women after menopause and in men, and in people who are taking corticosteroids for 6 months or more. Xgeva is used to treat a high calcium level due to cancer and to prevent bone fractures and other bone problems caused by multiple myeloma or cancer bone metastases. Xgeva is also used to treat giant cell tumor of the bone. This medicine may be used for other purposes; ask your health care provider or pharmacist if you have questions. COMMON BRAND NAME(S): Prolia, XGEVA What should I tell my health care provider before I take this medicine? They need to know if you have any of these conditions:  dental disease  having surgery or tooth extraction  infection  kidney disease  low levels of calcium or Vitamin D in the blood  malnutrition  on hemodialysis  skin conditions or sensitivity  thyroid or parathyroid disease  an unusual reaction to denosumab, other medicines, foods, dyes, or preservatives  pregnant or trying to get pregnant  breast-feeding How should I use this medicine? This medicine is for injection under the skin. It is given by a health care professional in a hospital or clinic setting. A special MedGuide will be given to you before each treatment. Be sure to read this information carefully each time. For Prolia, talk to your pediatrician regarding the use of this medicine in children. Special care may be needed. For Xgeva, talk to your pediatrician regarding the use of this medicine in children. While this drug may be prescribed for children as young as 13 years for selected conditions, precautions do apply. Overdosage: If you think you have taken too much of this medicine contact a poison control center or emergency room at once. NOTE: This medicine is only for you. Do not share this medicine with others. What if I miss a dose? It is  important not to miss your dose. Call your doctor or health care professional if you are unable to keep an appointment. What may interact with this medicine? Do not take this medicine with any of the following medications:  other medicines containing denosumab This medicine may also interact with the following medications:  medicines that lower your chance of fighting infection  steroid medicines like prednisone or cortisone This list may not describe all possible interactions. Give your health care provider a list of all the medicines, herbs, non-prescription drugs, or dietary supplements you use. Also tell them if you smoke, drink alcohol, or use illegal drugs. Some items may interact with your medicine. What should I watch for while using this medicine? Visit your doctor or health care professional for regular checks on your progress. Your doctor or health care professional may order blood tests and other tests to see how you are doing. Call your doctor or health care professional for advice if you get a fever, chills or sore throat, or other symptoms of a cold or flu. Do not treat yourself. This drug may decrease your body's ability to fight infection. Try to avoid being around people who are sick. You should make sure you get enough calcium and vitamin D while you are taking this medicine, unless your doctor tells you not to. Discuss the foods you eat and the vitamins you take with your health care professional. See your dentist regularly. Brush and floss your teeth as directed. Before you have any dental work done, tell your dentist you are   receiving this medicine. Do not become pregnant while taking this medicine or for 5 months after stopping it. Talk with your doctor or health care professional about your birth control options while taking this medicine. Women should inform their doctor if they wish to become pregnant or think they might be pregnant. There is a potential for serious side  effects to an unborn child. Talk to your health care professional or pharmacist for more information. What side effects may I notice from receiving this medicine? Side effects that you should report to your doctor or health care professional as soon as possible:  allergic reactions like skin rash, itching or hives, swelling of the face, lips, or tongue  bone pain  breathing problems  dizziness  jaw pain, especially after dental work  redness, blistering, peeling of the skin  signs and symptoms of infection like fever or chills; cough; sore throat; pain or trouble passing urine  signs of low calcium like fast heartbeat, muscle cramps or muscle pain; pain, tingling, numbness in the hands or feet; seizures  unusual bleeding or bruising  unusually weak or tired Side effects that usually do not require medical attention (report to your doctor or health care professional if they continue or are bothersome):  constipation  diarrhea  headache  joint pain  loss of appetite  muscle pain  runny nose  tiredness  upset stomach This list may not describe all possible side effects. Call your doctor for medical advice about side effects. You may report side effects to FDA at 1-800-FDA-1088. Where should I keep my medicine? This medicine is only given in a clinic, doctor's office, or other health care setting and will not be stored at home. NOTE: This sheet is a summary. It may not cover all possible information. If you have questions about this medicine, talk to your doctor, pharmacist, or health care provider.  2021 Elsevier/Gold Standard (2017-11-09 16:10:44)

## 2020-11-23 ENCOUNTER — Encounter: Payer: Self-pay | Admitting: Oncology

## 2021-04-29 ENCOUNTER — Other Ambulatory Visit: Payer: Self-pay | Admitting: *Deleted

## 2021-04-29 DIAGNOSIS — Z170 Estrogen receptor positive status [ER+]: Secondary | ICD-10-CM

## 2021-04-29 DIAGNOSIS — C50511 Malignant neoplasm of lower-outer quadrant of right female breast: Secondary | ICD-10-CM

## 2021-04-30 ENCOUNTER — Other Ambulatory Visit: Payer: Self-pay | Admitting: Adult Health

## 2021-04-30 DIAGNOSIS — C50511 Malignant neoplasm of lower-outer quadrant of right female breast: Secondary | ICD-10-CM

## 2021-05-01 NOTE — Progress Notes (Signed)
[place]  [phone] [phone]   ID: Michele Alvarez DOB: 1960-11-28  MR#: [id number]  VOZ#:[id number]  Patient Care Team: Jonathon Jordan, MD as PCP - General (Family Medicine) Avon Gully, NP as Nurse Practitioner (Obstetrics and Gynecology) [name], MD as Consulting Physician (General Surgery) Joshawa Dubin, Virgie Dad, MD as Consulting Physician (Oncology) Eppie Gibson, MD as Attending Physician (Radiation Oncology) Jovita Gamma, MD as Consulting Physician (Neurosurgery) Delice Bison, Charlestine Massed, NP as Nurse Practitioner (Hematology and Oncology) Roseanne Kaufman, MD as Consulting Physician (Orthopedic Surgery) OTHER MD:  CHIEF COMPLAINT: Estrogen receptor positive breast cancer  CURRENT TREATMENT: Anastrozole   INTERVAL HISTORY: Michele Alvarez returns today for follow-up of her estrogen receptor positive breast cancer.    She continues on anastrozole.  Vaginal dryness remains an issue but "I have gotten used to that by now".  Hot flashes are not a significant problem.  She obtains the drug at a good price.  She was scheduled to receive her fifth dose of Prolia today.  She tolerates this with no side effects that she is aware of.  However there have been financial issues related to this and she would prefer not to be treated today.  Her most recent bone density screening on 09/08/2020 showed a T-score of -2.1, which is considered osteopenic.  This is improved from the prior -2.4  Most recent mammogram at [facility] 09/08/2020 showed breast density category C.  There was no evidence of malignancy.  There was a palpable lump in the right anterior axilla which was evaluated with ultrasound and proved to be a lipoma.   REVIEW OF SYSTEMS: Michele Alvarez generally feels well.  She does not do any structured exercises but "stays on her feet all the time".  She does play pickle ball.  Detailed review of systems was otherwise stable.   COVID 19  VACCINATION STATUS: Status post Pfizer x2, and status post booster October 2021   BREAST CANCER HISTORY: From the original intake note:  "Michele Alvarez" had routine screening mammography at [name] 08/07/2015. There was an area of calcification in the right breast 2:00 position. There was also an area of calcification in the left breast 7:00 position. She was recalled for bilateral diagnostic mammography 08/12/2015. The left breast calcifications were felt to be likely benign and repeat mammography in 6 months was suggested.  On the right, breast density was category D. There was a 1.2 cm irregular mass at the 6:00, inframammary position and also suspicious calcifications. Adding those together the area measured 2.5 cm. Ultrasound of the right breast on the same day found a 1.3 cm lobulated mass in the right breast at the 6:00 position. There was a 1.7 cm axillary lymph node with a fatty hilum.   On 08/30/2015 the patient underwent biopsy of the breast mass, the abnormal lymph node, and the area of calcifications. The calcifications ([id number]) were associated with ductal carcinoma in situ, with the prognostic panel pending. 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 The lymph node also was positive.  Note that the clip to the patient's breast mass did not deploy.  The patient's subsequent history is as detailed below   PAST MEDICAL HISTORY: Past Medical History:  Diagnosis Date   Breast cancer [facility])    Breast cancer of lower-outer quadrant of right female breast ([place]) 08/19/2015   History of radiation therapy 04/18/16- 05/30/16   Right Breast 50 Gy  in 25 fractions. then boosted to 60 Gy in 5 fractions   Hot flashes    PONV (postoperative nausea and vomiting)     PAST SURGICAL HISTORY: Past Surgical History:  Procedure Laterality Date   BACK SURGERY  2007, 2014   x2, lumbar surgery  used same incision for both surgeries.    BREAST LUMPECTOMY WITH RADIOACTIVE SEED AND AXILLARY LYMPH NODE DISSECTION Right 03/08/2016   Procedure: RIGHT BREAST BRACKETED RADIOACTIVE SEED GUIDED LUMPECTOMY RIGHT AXILLARY SENTINEL LYMPH NODE BIOPSY AND RIGHT RADIOACTIVE SEED TARGETED AXILLARY NODE DISSECTION;  Surgeon: [name], MD;  Location: [place];  Service: General;  Laterality: Right;   NECK SURGERY  10/05/99   PORT-A-CATH REMOVAL Right 03/08/2016   Procedure: REMOVAL PORT-A-CATH;  Surgeon: [name], MD;  Location: [place];  Service: General;  Laterality: Right;   PORTACATH PLACEMENT Right 09/09/2015   Procedure: INSERTION PORT-A-CATH WITH [place] ;  Surgeon: [name], MD;  Location: [place];  Service: General;  Laterality: Right;    FAMILY HISTORY Family History  Problem Relation Age of Onset   Parkinson's disease Mother    COPD Father    Cancer Neg Hx   The patient's father died in Oct 05, 2014 at the age of 39. He had significant COPD. The patient's mother died at the age of 24 from Parkinson's disease. The patient has 1 brother, 1 sister. There is no cancer history in the family to her knowledge.   GYNECOLOGIC HISTORY:  No LMP recorded. Patient is postmenopausal. Menarche age 49. She has GI asked P0. She stopped having periods in January 2014. She never took hormone replacement or oral contraceptives.   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 She lives with her partner, [address] and 2 cats.  [name] runs a Paramed[employer].    ADVANCED DIRECTIVES: She has named her partner, [name], as her [place]: Social History   Tobacco Use   Smoking status: Never   Smokeless tobacco: Never  Substance Use Topics   Alcohol use: Yes    Comment: social, she drinks one alchohol drink daily.    Drug use: No     Colonoscopy: Never   PAP:  Bone density: 08/2018, -2.5  Lipid panel:  Allergies   Allergen Reactions   Latex Other (See Comments)    Fever blisters   Medical Adhesive Remover Dermatitis    Reaction to dermabond, do not use    Current Outpatient Medications  Medication Sig Dispense Refill   anastrozole (ARIMIDEX) 1 MG tablet Take 1 tablet by mouth once daily 90 tablet 3   cholecalciferol (VITAMIN D) 1000 units tablet Take 1 tablet (1,000 Units total) by mouth daily. (Patient not taking: Reported on 02/23/2017) 100 tablet 3   denosumab (PROLIA) 60 MG/ML SOSY injection Inject 60 mg into the skin every 6 (six) months. 1 mL 1   Multiple Vitamin (MULTIVITAMIN) tablet Take 1 tablet by mouth daily.     No current facility-administered medications for this visit.    OBJECTIVE: White woman who appears younger than stated age  2:   05/02/21 1414  BP: 122/78  Pulse: 74  Resp: 17  Temp: 98.1 F (36.7 C)  SpO2: 100%     Body mass index is 25.44 kg/m.    ECOG FS:1 - Symptomatic but completely ambulatory  Sclerae unicteric, EOMs intact Wearing a mask No cervical or supraclavicular adenopathy Lungs no rales or rhonchi Heart regular rate and rhythm  Abd soft, nontender, positive bowel sounds MSK no focal spinal tenderness, no upper extremity lymphedema Neuro: nonfocal, well oriented, appropriate affect Breasts: The right breast has undergone lumpectomy and radiation.  The cosmetic result is excellent.  There is no evidence of local recurrence.  The left breast and both axillae are benign.   LAB RESULTS:  CMP     Component Value Date/Time   NA 143 05/02/2021 1348   NA 142 06/18/2017 1441   K 3.9 05/02/2021 1348   K 4.0 06/18/2017 1441   CL 108 05/02/2021 1348   CO2 24 05/02/2021 1348   CO2 28 06/18/2017 1441   GLUCOSE 109 (H) 05/02/2021 1348   GLUCOSE 106 06/18/2017 1441   BUN 15 05/02/2021 1348   BUN 12.8 06/18/2017 1441   CREATININE 0.82 05/02/2021 1348   CREATININE 0.9 06/18/2017 1441   CALCIUM 9.4 05/02/2021 1348   CALCIUM 9.5 06/18/2017 1441    PROT 6.8 05/02/2021 1348   PROT 6.9 06/18/2017 1441   ALBUMIN 4.2 05/02/2021 1348   ALBUMIN 4.2 06/18/2017 1441   AST 24 05/02/2021 1348   AST 22 06/18/2017 1441   ALT 25 05/02/2021 1348   ALT 21 06/18/2017 1441   ALKPHOS 54 05/02/2021 1348   ALKPHOS 85 06/18/2017 1441   BILITOT 0.6 05/02/2021 1348   BILITOT 0.34 06/18/2017 1441   GFRNONAA >60 05/02/2021 1348   GFRAA >60 11/24/2019 1436    INo results found for: SPEP, UPEP  Lab Results  Component Value Date   WBC 5.2 05/02/2021   NEUTROABS 3.1 05/02/2021   HGB 12.4 05/02/2021   HCT 36.1 05/02/2021   MCV 91.6 05/02/2021   PLT 222 05/02/2021      Chemistry      Component Value Date/Time   NA 143 05/02/2021 1348   NA 142 06/18/2017 1441   K 3.9 05/02/2021 1348   K 4.0 06/18/2017 1441   CL 108 05/02/2021 1348   CO2 24 05/02/2021 1348   CO2 28 06/18/2017 1441   BUN 15 05/02/2021 1348   BUN 12.8 06/18/2017 1441   CREATININE 0.82 05/02/2021 1348   CREATININE 0.9 06/18/2017 1441      Component Value Date/Time   CALCIUM 9.4 05/02/2021 1348   CALCIUM 9.5 06/18/2017 1441   ALKPHOS 54 05/02/2021 1348   ALKPHOS 85 06/18/2017 1441   AST 24 05/02/2021 1348   AST 22 06/18/2017 1441   ALT 25 05/02/2021 1348   ALT 21 06/18/2017 1441   BILITOT 0.6 05/02/2021 1348   BILITOT 0.34 06/18/2017 1441       No results found for: LABCA2  No components found for: LABCA125  No results for input(s): INR in the last 168 hours.  Urinalysis No results found for: COLORURINE, APPEARANCEUR, LABSPEC, PHURINE, GLUCOSEU, HGBUR, BILIRUBINUR, KETONESUR, PROTEINUR, UROBILINOGEN, NITRITE, LEUKOCYTESUR  ELIGIBLE FOR AVAILABLE RESEARCH PROTOCOL: no   STUDIES: No results found.    ASSESSMENT: 60 y.o. [place] woman status post right breast lower outer quadrant and axillary lymph node biopsy 08/30/2015, both positive for a clinical T1-2 N1, stage II invasive ductal carcinoma, grade 3, estrogen and progesterone receptor positive, HER-2/neu  nonamplified, with an MIB-1 of 80%  (1) neoadjuvant chemotherapy started 09/16/2015 consisting of doxorubicin and cyclophosphamide in dose dense fashion 4, completed 10/28/2015, followed by paclitaxel weekly 12  started 11/19/2015, completed 02/11/2016  (2) Status post right lumpectomy and sentinel lymph node sampling 03/08/2016 for a residual ypT1b ypN0 invasive ductal carcinoma, grade 2, with focally positive margins not requiring further  excision  (3) adjuvant radiation 04/18/16 - 05/30/16 Site/dose:  Total dose 60 Gy in 30 fx   (4) anastrozole started 07/17/2016,completing 5 years December 2022  (a) bone density 08/25/2016 at [facility] showed a T score of -2.4  (b) bone density February 2020 showed osteoporosis with a T score of -2.5  (c) denosumab/Prolia started 05/16/2019, last (4th) dose 11/09/2020  (d) bone density 09/08/2020 at [facility] showed a T score of -2.1.  (5) participating in DCP-001 study    PLAN: Michele Alvarez is now a little over 5 years out from definitive surgery for her breast cancer with no evidence of disease recurrence.  This is very favorable.  She is completing 5 years of anastrozole in December.  I do not feel she would get significant benefit from continuing beyond 5 years and she is comfortable stopping.  She is hoping the vaginal dryness problem will improve off anastrozole.  Of course this is commonly experienced in menopause so there may or may not be improvement.  Many of my patients however report feeling generally better off anastrozole and I am hopeful that will be her situation  As stated above she would prefer not to receive Prolia today.  Her bone density is improved.  I have suggested she continue vitamin D and walk 45 minutes a day, with a goal of 5 days a week.  I think if she did that she may continue to improve her bone density but also will improve her functional status and fitness overall.  At this point I feel comfortable releasing her to her primary care  physician.  All she will need in terms of breast cancer follow-up is her yearly mammography and a yearly physician breast exam  We will be glad to see Michele Alvarez at any point in the future if and when the need arises but as of now are making no further routine appointments for her here.  Total encounter time 25 minutes.*  Total encounter time 25 minutes.*   Taviana Westergren, Virgie Dad, MD  05/02/21 2:43 PM Medical Oncology and Hematology [facility] [place],  [address] Tel. [phone]    Fax. [phone]    I, Wilburn Mylar am acting as scribe for Dr. Virgie Dad. Aven Cegielski.  I, Lurline Del MD, have reviewed the above documentation for accuracy and completeness, and I agree with the above.   *Total Encounter Time as defined by the Centers for Medicare and Medicaid Services includes, in addition to the face-to-face time of a patient visit (documented in the note above) non-face-to-face time: obtaining and reviewing outside history, ordering and reviewing medications, tests or procedures, care coordination (communications with other health care professionals or caregivers) and documentation in the medical record.

## 2021-05-02 ENCOUNTER — Inpatient Hospital Stay: Payer: 59

## 2021-05-02 ENCOUNTER — Inpatient Hospital Stay (HOSPITAL_BASED_OUTPATIENT_CLINIC_OR_DEPARTMENT_OTHER): Payer: 59 | Admitting: Oncology

## 2021-05-02 ENCOUNTER — Other Ambulatory Visit: Payer: Self-pay

## 2021-05-02 ENCOUNTER — Encounter: Payer: Self-pay | Admitting: Oncology

## 2021-05-02 ENCOUNTER — Inpatient Hospital Stay: Payer: 59 | Attending: Oncology

## 2021-05-02 VITALS — BP 122/78 | HR 74 | Temp 98.1°F | Resp 17 | Wt 167.3 lb

## 2021-05-02 DIAGNOSIS — Z170 Estrogen receptor positive status [ER+]: Secondary | ICD-10-CM

## 2021-05-02 DIAGNOSIS — Z923 Personal history of irradiation: Secondary | ICD-10-CM | POA: Insufficient documentation

## 2021-05-02 DIAGNOSIS — N898 Other specified noninflammatory disorders of vagina: Secondary | ICD-10-CM | POA: Diagnosis not present

## 2021-05-02 DIAGNOSIS — C50511 Malignant neoplasm of lower-outer quadrant of right female breast: Secondary | ICD-10-CM | POA: Insufficient documentation

## 2021-05-02 DIAGNOSIS — C773 Secondary and unspecified malignant neoplasm of axilla and upper limb lymph nodes: Secondary | ICD-10-CM | POA: Insufficient documentation

## 2021-05-02 DIAGNOSIS — Z9221 Personal history of antineoplastic chemotherapy: Secondary | ICD-10-CM | POA: Diagnosis not present

## 2021-05-02 DIAGNOSIS — Z79811 Long term (current) use of aromatase inhibitors: Secondary | ICD-10-CM | POA: Insufficient documentation

## 2021-05-02 LAB — CMP (CANCER CENTER ONLY)
ALT: 25 U/L (ref 0–44)
AST: 24 U/L (ref 15–41)
Albumin: 4.2 g/dL (ref 3.5–5.0)
Alkaline Phosphatase: 54 U/L (ref 38–126)
Anion gap: 11 (ref 5–15)
BUN: 15 mg/dL (ref 6–20)
CO2: 24 mmol/L (ref 22–32)
Calcium: 9.4 mg/dL (ref 8.9–10.3)
Chloride: 108 mmol/L (ref 98–111)
Creatinine: 0.82 mg/dL (ref 0.44–1.00)
GFR, Estimated: >60 mL/min (ref >60)
Glucose, Bld: 109 mg/dL — ABNORMAL HIGH (ref 70–99)
Potassium: 3.9 mmol/L (ref 3.5–5.1)
Sodium: 143 mmol/L (ref 135–145)
Total Bilirubin: 0.6 mg/dL (ref 0.3–1.2)
Total Protein: 6.8 g/dL (ref 6.5–8.1)

## 2021-05-02 LAB — CBC WITH DIFFERENTIAL (CANCER CENTER ONLY)
Abs Immature Granulocytes: 0.01 10*3/uL (ref 0.00–0.07)
Basophils Absolute: 0 10*3/uL (ref 0.0–0.1)
Basophils Relative: 0 %
Eosinophils Absolute: 0 10*3/uL (ref 0.0–0.5)
Eosinophils Relative: 1 %
HCT: 36.1 % (ref 36.0–46.0)
Hemoglobin: 12.4 g/dL (ref 12.0–15.0)
Immature Granulocytes: 0 %
Lymphocytes Relative: 31 %
Lymphs Abs: 1.6 10*3/uL (ref 0.7–4.0)
MCH: 31.5 pg (ref 26.0–34.0)
MCHC: 34.3 g/dL (ref 30.0–36.0)
MCV: 91.6 fL (ref 80.0–100.0)
Monocytes Absolute: 0.4 10*3/uL (ref 0.1–1.0)
Monocytes Relative: 7 %
Neutro Abs: 3.1 10*3/uL (ref 1.7–7.7)
Neutrophils Relative %: 61 %
Platelet Count: 222 10*3/uL (ref 150–400)
RBC: 3.94 MIL/uL (ref 3.87–5.11)
RDW: 12.6 % (ref 11.5–15.5)
WBC Count: 5.2 10*3/uL (ref 4.0–10.5)
nRBC: 0 % (ref 0.0–0.2)

## 2021-05-02 MED ORDER — ANASTROZOLE 1 MG PO TABS: 1.0000 mg | ORAL_TABLET | Freq: Every day | ORAL | 1 refills | Status: DC

## 2021-06-23 ENCOUNTER — Encounter: Payer: Self-pay | Admitting: Internal Medicine

## 2021-07-13 ENCOUNTER — Ambulatory Visit: Payer: 59

## 2021-07-13 VITALS — Ht 68.0 in | Wt 165.0 lb

## 2021-07-13 DIAGNOSIS — Z1211 Encounter for screening for malignant neoplasm of colon: Secondary | ICD-10-CM

## 2021-07-13 NOTE — Progress Notes (Signed)
No egg or soy allergy known to patient  No issues with past sedation with any surgeries or procedures Patient denies ever being told they had issues or difficulty with intubation  No FH of Malignant Hyperthermia No diet pills per patient No home 02 use per patient  No blood thinners per patient  Pt denies issues with constipation  No A fib or A flutter  EMMI video to pt or via MyChart  COVID 19 guidelines implemented in [place] today with Pt and CMA Pt is fully vaccinated for Covid (5 shots)    Due to the COVID-19 pandemic we are asking patients to follow certain guidelines.  Pt aware of COVID protocols and [facility] guidelines

## 2021-08-02 ENCOUNTER — Encounter: Payer: Self-pay | Admitting: Internal Medicine

## 2021-08-08 ENCOUNTER — Ambulatory Visit (AMBULATORY_SURGERY_CENTER): Payer: 59 | Admitting: Internal Medicine

## 2021-08-08 ENCOUNTER — Encounter: Payer: Self-pay | Admitting: Internal Medicine

## 2021-08-08 VITALS — BP 127/78 | HR 97 | Temp 97.2°F | Resp 16 | Ht 68.0 in | Wt 165.0 lb

## 2021-08-08 DIAGNOSIS — Z1211 Encounter for screening for malignant neoplasm of colon: Secondary | ICD-10-CM | POA: Diagnosis present

## 2021-08-08 MED ORDER — SODIUM CHLORIDE 0.9 % IV SOLN: 500.0000 mL | Freq: Once | INTRAVENOUS | Status: DC

## 2021-08-08 NOTE — Progress Notes (Signed)
Report to PACU, RN, vss, BBS= Clear.  

## 2021-08-08 NOTE — Op Note (Signed)
[place] Patient Name: Michele Alvarez Procedure Date: 08/08/2021 2:27 PM MRN: [id number] Endoscopist: Gatha Mayer , MD Age: 61 Referring MD:  Date of Birth: [birth year] Gender: Female Account #: [id number] Procedure:                Colonoscopy Indications:              Screening for colorectal malignant neoplasm, This                            is the patient's first colonoscopy Medicines:                Propofol per Anesthesia, Monitored Anesthesia Care Procedure:                Pre-Anesthesia Assessment:                           - Prior to the procedure, a History and Physical                            was performed, and patient medications and                            allergies were reviewed. The patient's tolerance of                            previous anesthesia was also reviewed. The risks                            and benefits of the procedure and the sedation                            options and risks were discussed with the patient.                            All questions were answered, and informed consent                            was obtained. Prior Anticoagulants: The patient has                            taken no previous anticoagulant or antiplatelet                            agents. ASA Grade Assessment: II - A patient with                            mild systemic disease. After reviewing the risks                            and benefits, the patient was deemed in                            satisfactory condition to undergo the procedure.  After obtaining informed consent, the colonoscope                            was passed under direct vision. Throughout the                            procedure, the patient's blood pressure, pulse, and                            oxygen saturations were monitored continuously. The                            Olympus PCF-H190DL (#[id number]) Colonoscope was                             introduced through the anus and advanced to the the                            cecum, identified by appendiceal orifice and                            ileocecal valve. The colonoscopy was performed                            without difficulty. The patient tolerated the                            procedure well. The quality of the bowel                            preparation was good. The ileocecal valve,                            appendiceal orifice, and rectum were photographed.                            The bowel preparation used was Miralax via split                            dose instruction. Scope In: 2:37:33 PM Scope Out: 8:93:81 PM Scope Withdrawal Time: 0 hours 7 minutes 48 seconds  Total Procedure Duration: 0 hours 11 minutes 45 seconds  Findings:                 The perianal and digital rectal examinations were                            normal.                           Multiple small and large-mouthed diverticula were                            found in the sigmoid colon.  The exam was otherwise without abnormality on                            direct and retroflexion views. Complications:            No immediate complications. Estimated Blood Loss:     Estimated blood loss: none. Impression:               - Diverticulosis in the sigmoid colon.                           - The examination was otherwise normal on direct                            and retroflexion views.                           - No specimens collected. Recommendation:           - Patient has a contact number available for                            emergencies. The signs and symptoms of potential                            delayed complications were discussed with the                            patient. Return to normal activities tomorrow.                            Written discharge instructions were provided to the                            patient.                            - Resume previous diet.                           - Continue present medications.                           - Repeat colonoscopy in 10 years for screening                            purposes. Gatha Mayer, MD 08/08/2021 2:54:48 PM This report has been signed electronically.

## 2021-08-08 NOTE — Patient Instructions (Addendum)
I am happy to report that no polyps or cancer were seen.  You do have diverticulosis - thickened muscle rings and pouches in the colon wall. Please read the handout about this condition.  Next routine colonoscopy or other screening test in 10 years - 2033.  I appreciate the opportunity to care for you. Gatha Mayer, MD, FACG  YOU HAD AN ENDOSCOPIC PROCEDURE TODAY AT [facility]:   Refer to the procedure report that was given to you for any specific questions about what was found during the examination.  If the procedure report does not answer your questions, please call your gastroenterologist to clarify.  If you requested that your care partner not be given the details of your procedure findings, then the procedure report has been included in a sealed envelope for you to review at your convenience later.  YOU SHOULD EXPECT: Some feelings of bloating in the abdomen. Passage of more gas than usual.  Walking can help get rid of the air that was put into your GI tract during the procedure and reduce the bloating. If you had a lower endoscopy (such as a colonoscopy or flexible sigmoidoscopy) you may notice spotting of blood in your stool or on the toilet paper. If you underwent a bowel prep for your procedure, you may not have a normal bowel movement for a few days.  Please Note:  You might notice some irritation and congestion in your nose or some drainage.  This is from the oxygen used during your procedure.  There is no need for concern and it should clear up in a day or so.  SYMPTOMS TO REPORT IMMEDIATELY:  Following lower endoscopy (colonoscopy or flexible sigmoidoscopy):  Excessive amounts of blood in the stool  Significant tenderness or worsening of abdominal pains  Swelling of the abdomen that is new, acute  Fever of 100F or higher  For urgent or emergent issues, a gastroenterologist can be reached at any hour by calling [phone]. Do not use MyChart messaging  for urgent concerns.    DIET:  We do recommend a small meal at first, but then you may proceed to your regular diet.  Drink plenty of fluids but you should avoid alcoholic beverages for 24 hours.  ACTIVITY:  You should plan to take it easy for the rest of today and you should NOT DRIVE or use heavy machinery until tomorrow (because of the sedation medicines used during the test).    FOLLOW UP: Our staff will call the number listed on your records 48-72 hours following your procedure to check on you and address any questions or concerns that you may have regarding the information given to you following your procedure. If we do not reach you, we will leave a message.  We will attempt to reach you two times.  During this call, we will ask if you have developed any symptoms of COVID 19. If you develop any symptoms (ie: fever, flu-like symptoms, shortness of breath, cough etc.) before then, please call [phone].  If you test positive for Covid 19 in the 2 weeks post procedure, please call and report this information to [place].    If any biopsies were taken you will be contacted by phone or by letter within the next 1-3 weeks.  Please call us at [phone] if you have not heard about the biopsies in 3 weeks.    SIGNATURES/CONFIDENTIALITY: You and/or your care partner have signed paperwork which will be entered into your  electronic medical record.  These signatures attest to the fact that that the information above on your After Visit Summary has been reviewed and is understood.  Full responsibility of the confidentiality of this discharge information lies with you and/or your care-partner.

## 2021-08-08 NOTE — Progress Notes (Signed)
[facility] History and Physical   Primary Care Physician:  Jonathon Jordan, MD   Reason for Procedure:   Colon cancer screening  Plan:    colonoscopy     HPI: Michele Alvarez is a 61 y.o. female here for screening colonoscopy   Past Medical History:  Diagnosis Date   Breast cancer [facility])    Breast cancer of lower-outer quadrant of right female breast ([place]) 08/19/2015   History of radiation therapy 04/18/16- 05/30/16   Right Breast 50 Gy in 25 fractions. then boosted to 60 Gy in 5 fractions   Hot flashes    PONV (postoperative nausea and vomiting)     Past Surgical History:  Procedure Laterality Date   BACK SURGERY  2007, 2014   x2, lumbar surgery used same incision for both surgeries.    BREAST LUMPECTOMY WITH RADIOACTIVE SEED AND AXILLARY LYMPH NODE DISSECTION Right 03/08/2016   Procedure: RIGHT BREAST BRACKETED RADIOACTIVE SEED GUIDED LUMPECTOMY RIGHT AXILLARY SENTINEL LYMPH NODE BIOPSY AND RIGHT RADIOACTIVE SEED TARGETED AXILLARY NODE DISSECTION;  Surgeon: [name], MD;  Location: [place];  Service: General;  Laterality: Right;   NECK SURGERY  2001   PORT-A-CATH REMOVAL Right 03/08/2016   Procedure: REMOVAL PORT-A-CATH;  Surgeon: [name], MD;  Location: [place];  Service: General;  Laterality: Right;   PORTACATH PLACEMENT Right 09/09/2015   Procedure: INSERTION PORT-A-CATH WITH [place] ;  Surgeon: [name], MD;  Location: [place];  Service: General;  Laterality: Right;    Prior to Admission medications   Medication Sig Start Date End Date Taking? Authorizing Provider  cholecalciferol (VITAMIN D) 1000 units tablet Take 1 tablet (1,000 Units total) by mouth daily. Patient not taking: Reported on 02/23/2017 01/09/17   Magrinat, Virgie Dad, MD  Multiple Vitamin (MULTIVITAMIN) tablet Take 1 tablet by mouth daily.    [provider]    Current Outpatient Medications  Medication  Sig Dispense Refill   cholecalciferol (VITAMIN D) 1000 units tablet Take 1 tablet (1,000 Units total) by mouth daily. (Patient not taking: Reported on 02/23/2017) 100 tablet 3   Multiple Vitamin (MULTIVITAMIN) tablet Take 1 tablet by mouth daily.     Current Facility-Administered Medications  Medication Dose Route Frequency Provider Last Rate Last Admin   0.9 %  sodium chloride infusion  500 mL Intravenous Once Gatha Mayer, MD        Allergies as of 08/08/2021 - Review Complete 08/08/2021  Allergen Reaction Noted   Wound dressing adhesive Dermatitis 09/14/2015   Latex Other (See Comments) 09/01/2015   Medical adhesive remover Dermatitis 09/14/2015    Family History  Problem Relation Age of Onset   Parkinson's disease Mother    COPD Father    Cancer Neg Hx    Colon cancer Neg Hx    Esophageal cancer Neg Hx    Rectal cancer Neg Hx    Stomach cancer Neg Hx     Social History   Socioeconomic History   Marital status: Single    Spouse name: Not on file   Number of children: Not on file   Years of education: Not on file   Highest education level: Not on file  Occupational History   Not on file  Tobacco Use   Smoking status: Never   Smokeless tobacco: Never  Vaping Use   Vaping Use: Never used  Substance and Sexual Activity   Alcohol use: Yes    Comment: social, she drinks one alchohol  drink weekly   Drug use: No   Review of Systems:  All other review of systems negative except as mentioned in the HPI.  Physical Exam: Vital signs BP 120/74    Pulse (!) 109    Temp (!) 97.2 F (36.2 C) (Temporal)    Ht 5\' 8"  (1.727 m)    Wt 165 lb (74.8 kg)    SpO2 99%    BMI 25.09 kg/m   General:   Alert,  Well-developed, well-nourished, pleasant and cooperative in NAD Lungs:  Clear throughout to auscultation.   Heart:  Regular rate and rhythm; no murmurs, clicks, rubs,  or gallops. Abdomen:  Soft, nontender and nondistended. Normal bowel sounds.   Neuro/Psych:  Alert and  cooperative. Normal mood and affect. A and O x 3   @Bawi Lakins  Simonne Maffucci, MD, [facility] [phone] (pager) 08/08/2021 2:28 PM@

## 2021-08-08 NOTE — Progress Notes (Signed)
VS-CW  Pt's states no medical or surgical changes since previsit or office visit.  

## 2021-08-09 ENCOUNTER — Other Ambulatory Visit: Payer: Self-pay

## 2021-08-10 ENCOUNTER — Telehealth: Payer: Self-pay

## 2021-08-10 NOTE — Telephone Encounter (Signed)
°  Follow up Call-  Call back number 08/08/2021  Post procedure Call Back phone  # [phone]  Permission to leave phone message Yes  Some recent data might be hidden     Patient questions:  Do you have a fever, pain , or abdominal swelling? No. Pain Score  0 *  Have you tolerated food without any problems? Yes.    Have you been able to return to your normal activities? Yes.    Do you have any questions about your discharge instructions: Diet   No. Medications  No. Follow up visit  No.  Do you have questions or concerns about your Care? No.  Actions: * If pain score is 4 or above: No action needed, pain <4.

## 2021-08-18 ENCOUNTER — Encounter: Payer: 59 | Admitting: Internal Medicine

## 2022-08-03 ENCOUNTER — Ambulatory Visit: Payer: 59 | Admitting: Internal Medicine

## 2022-08-03 ENCOUNTER — Encounter: Payer: Self-pay | Admitting: Internal Medicine

## 2022-08-03 VITALS — BP 110/70 | HR 81 | Temp 98.0°F | Ht 68.0 in | Wt 165.1 lb

## 2022-08-03 DIAGNOSIS — C50511 Malignant neoplasm of lower-outer quadrant of right female breast: Secondary | ICD-10-CM | POA: Diagnosis not present

## 2022-08-03 DIAGNOSIS — Z23 Encounter for immunization: Secondary | ICD-10-CM

## 2022-08-03 DIAGNOSIS — Z0000 Encounter for general adult medical examination without abnormal findings: Secondary | ICD-10-CM | POA: Diagnosis not present

## 2022-08-03 DIAGNOSIS — Z170 Estrogen receptor positive status [ER+]: Secondary | ICD-10-CM

## 2022-08-03 NOTE — Progress Notes (Signed)
New Patient Office Visit     CC/Reason for Visit: Establish care, annual preventive exam Previous PCP: Alessandra Grout, MD Last Visit: Years ago  HPI: Michele Alvarez is a 62 y.o. female who is coming in today for the above mentioned reasons. Past Medical History is significant for: Right breast cancer in 2017.  She has otherwise been healthy.  It has been a number of years since she has had regular medical care.  She has routine eye and dental care, is scheduled for a dermatology appointment soon.  She is overdue for Tdap and shingles vaccinations.  She is overdue for cervical cancer screening, colonoscopy and mammogram are up-to-date.   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 then boosted to 60 Gy in 5 fractions   Hot flashes    PONV (postoperative nausea and vomiting)     Past Surgical History:  Procedure Laterality Date   BACK SURGERY  2007, 2014   x2, lumbar surgery used same incision for both surgeries.    BREAST LUMPECTOMY WITH RADIOACTIVE SEED AND AXILLARY LYMPH NODE DISSECTION Right 03/08/2016   Procedure: RIGHT BREAST BRACKETED RADIOACTIVE SEED GUIDED LUMPECTOMY RIGHT AXILLARY SENTINEL LYMPH NODE BIOPSY AND RIGHT RADIOACTIVE SEED TARGETED AXILLARY NODE DISSECTION;  Surgeon: [name], MD;  Location: [place];  Service: General;  Laterality: Right;   NECK SURGERY  2001   PORT-A-CATH REMOVAL Right 03/08/2016   Procedure: REMOVAL PORT-A-CATH;  Surgeon: [name], MD;  Location: [place];  Service: General;  Laterality: Right;   PORTACATH PLACEMENT Right 09/09/2015   Procedure: INSERTION PORT-A-CATH WITH [place] ;  Surgeon: [name], MD;  Location: [place];  Service:  General;  Laterality: Right;    Social History:  reports that she has never smoked. She has never used smokeless tobacco. She reports current alcohol use. She reports that she does not use drugs.  Allergies: Allergies  Allergen Reactions   Wound Dressing Adhesive Dermatitis    Reaction to dermabond, do not use   Latex Other (See Comments)    Fever blisters   Medical Adhesive Remover Dermatitis    Reaction to dermabond, do not use    Family History:  Family History  Problem Relation Age of Onset   Parkinson's disease Mother    COPD Father    Hyperlipidemia Sister    Cancer Brother    Cancer Maternal Grandmother    Hypertension Maternal Grandfather    Heart disease Maternal Grandfather    Hearing loss Maternal Grandfather    Heart attack Maternal Grandfather    Arthritis Paternal Grandmother    Colon cancer Neg Hx    Esophageal cancer Neg Hx    Rectal cancer Neg Hx    Stomach cancer Neg Hx     No current outpatient medications on file.  Review of Systems:  Negative except as indicated in HPI.   Physical Exam: Vitals:   08/03/22 0656  BP: 110/70  Pulse: 81  Temp: 98 F (36.7 C)  TempSrc: Oral  SpO2: 97%  Weight: 165 lb 1 oz (74.9 kg)  Height: '5\' 8"'$  (1.727 m)   Body mass index is 25.1 kg/m.  Physical Exam Vitals reviewed.  Constitutional:  General: She is not in acute distress.    Appearance: Normal appearance. She is not ill-appearing, toxic-appearing or diaphoretic.  HENT:     Head: Normocephalic.     Right Ear: Tympanic membrane, ear canal and external ear normal. There is no impacted cerumen.     Left Ear: Tympanic membrane, ear canal and external ear normal. There is no impacted cerumen.     Nose: Nose normal.     Mouth/Throat:     Mouth: Mucous membranes are moist.     Pharynx: Oropharynx is clear. No oropharyngeal exudate or posterior oropharyngeal erythema.  Eyes:     General: No scleral icterus.       Right eye: No discharge.         Left eye: No discharge.     Conjunctiva/sclera: Conjunctivae normal.     Pupils: Pupils are equal, round, and reactive to light.  Neck:     Vascular: No carotid bruit.  Cardiovascular:     Rate and Rhythm: Normal rate and regular rhythm.     Pulses: Normal pulses.     Heart sounds: Normal heart sounds.  Pulmonary:     Effort: Pulmonary effort is normal. No respiratory distress.     Breath sounds: Normal breath sounds.  Abdominal:     General: Abdomen is flat. Bowel sounds are normal.     Palpations: Abdomen is soft.  Musculoskeletal:        General: Normal range of motion.     Cervical back: Normal range of motion.  Skin:    General: Skin is warm and dry.     Capillary Refill: Capillary refill takes less than 2 seconds.  Neurological:     General: No focal deficit present.     Mental Status: She is alert and oriented to person, place, and time. Mental status is at baseline.  Psychiatric:        Mood and Affect: Mood normal.        Behavior: Behavior normal.        Thought Content: Thought content normal.        Judgment: Judgment normal.         Impression and Plan:  Encounter for preventive health examination - Plan: Ambulatory referral to Gynecology  Need for Tdap vaccination - Plan: Tdap vaccine greater than or equal to 7yo IM  Malignant neoplasm of lower-outer quadrant of right breast of female, estrogen receptor positive ([place]) - Plan: CBC with Differential/Platelet, Comprehensive metabolic panel, Hemoglobin A1c, Lipid panel, TSH, Vitamin B12, VITAMIN D 25 Hydroxy (Vit-D Deficiency, Fractures)  -Recommend routine eye and dental care. -Immunizations: Tdap in office today, advised to update shingles, COVID and flu are up-to-date -Healthy lifestyle discussed in detail. -Labs to be updated today. -Colon cancer screening: 07/2021 -Breast cancer screening: Scheduled for March/2023 -Cervical cancer screening: Overdue, GYN referral placed -Lung cancer screening: Not  applicable -Prostate cancer screening: Not applicable -DEXA: Not applicable       Humbert Morozov Isaac Bliss, MD [facility]

## 2022-08-04 ENCOUNTER — Other Ambulatory Visit (INDEPENDENT_AMBULATORY_CARE_PROVIDER_SITE_OTHER): Payer: 59

## 2022-08-04 DIAGNOSIS — C50511 Malignant neoplasm of lower-outer quadrant of right female breast: Secondary | ICD-10-CM

## 2022-08-04 DIAGNOSIS — Z170 Estrogen receptor positive status [ER+]: Secondary | ICD-10-CM | POA: Diagnosis not present

## 2022-08-04 LAB — CBC WITH DIFFERENTIAL/PLATELET
Basophils Absolute: 0 10*3/uL (ref 0.0–0.1)
Basophils Relative: 0.5 % (ref 0.0–3.0)
Eosinophils Absolute: 0.1 10*3/uL (ref 0.0–0.7)
Eosinophils Relative: 1.8 % (ref 0.0–5.0)
HCT: 39.8 % (ref 36.0–46.0)
Hemoglobin: 13.3 g/dL (ref 12.0–15.0)
Lymphocytes Relative: 33.2 % (ref 12.0–46.0)
Lymphs Abs: 1.7 10*3/uL (ref 0.7–4.0)
MCHC: 33.4 g/dL (ref 30.0–36.0)
MCV: 92.5 fl (ref 78.0–100.0)
Monocytes Absolute: 0.4 10*3/uL (ref 0.1–1.0)
Monocytes Relative: 8.3 % (ref 3.0–12.0)
Neutro Abs: 2.9 10*3/uL (ref 1.4–7.7)
Neutrophils Relative %: 56.2 % (ref 43.0–77.0)
Platelets: 235 10*3/uL (ref 150.0–400.0)
RBC: 4.3 Mil/uL (ref 3.87–5.11)
RDW: 13.2 % (ref 11.5–15.5)
WBC: 5.1 10*3/uL (ref 4.0–10.5)

## 2022-08-04 LAB — VITAMIN B12: Vitamin B-12: 203 pg/mL — ABNORMAL LOW (ref 211–911)

## 2022-08-04 LAB — COMPREHENSIVE METABOLIC PANEL
ALT: 15 U/L (ref 0–35)
AST: 21 U/L (ref 0–37)
Albumin: 4.5 g/dL (ref 3.5–5.2)
Alkaline Phosphatase: 89 U/L (ref 39–117)
BUN: 17 mg/dL (ref 6–23)
CO2: 28 mEq/L (ref 19–32)
Calcium: 9.6 mg/dL (ref 8.4–10.5)
Chloride: 104 mEq/L (ref 96–112)
Creatinine, Ser: 0.96 mg/dL (ref 0.40–1.20)
GFR: 64.57 mL/min (ref >60.00)
Glucose, Bld: 103 mg/dL — ABNORMAL HIGH (ref 70–99)
Potassium: 4.6 mEq/L (ref 3.5–5.1)
Sodium: 142 mEq/L (ref 135–145)
Total Bilirubin: 0.7 mg/dL (ref 0.2–1.2)
Total Protein: 6.8 g/dL (ref 6.0–8.3)

## 2022-08-04 LAB — LIPID PANEL
Cholesterol: 220 mg/dL — ABNORMAL HIGH (ref 0–200)
HDL: 71.4 mg/dL (ref >39.00)
LDL Cholesterol: 133 mg/dL — ABNORMAL HIGH (ref 0–99)
NonHDL: 148.88
Total CHOL/HDL Ratio: 3
Triglycerides: 78 mg/dL (ref 0.0–149.0)
VLDL: 15.6 mg/dL (ref 0.0–40.0)

## 2022-08-04 LAB — VITAMIN D 25 HYDROXY (VIT D DEFICIENCY, FRACTURES): VITD: 27.01 ng/mL — ABNORMAL LOW (ref 30.00–100.00)

## 2022-08-04 LAB — HEMOGLOBIN A1C: Hgb A1c MFr Bld: 6.1 % (ref 4.6–6.5)

## 2022-08-04 LAB — TSH: TSH: 1.88 u[IU]/mL (ref 0.35–5.50)

## 2022-08-07 ENCOUNTER — Encounter: Payer: Self-pay | Admitting: Internal Medicine

## 2022-08-07 ENCOUNTER — Other Ambulatory Visit: Payer: Self-pay | Admitting: Internal Medicine

## 2022-08-07 DIAGNOSIS — R7302 Impaired glucose tolerance (oral): Secondary | ICD-10-CM | POA: Insufficient documentation

## 2022-08-07 DIAGNOSIS — E538 Deficiency of other specified B group vitamins: Secondary | ICD-10-CM | POA: Insufficient documentation

## 2022-08-07 DIAGNOSIS — E559 Vitamin D deficiency, unspecified: Secondary | ICD-10-CM | POA: Insufficient documentation

## 2022-08-07 DIAGNOSIS — E785 Hyperlipidemia, unspecified: Secondary | ICD-10-CM | POA: Insufficient documentation

## 2022-08-07 MED ORDER — VITAMIN D (ERGOCALCIFEROL) 1.25 MG (50000 UNIT) PO CAPS: 50000.0000 [IU] | ORAL_CAPSULE | ORAL | 0 refills | Status: AC

## 2022-08-10 ENCOUNTER — Telehealth: Payer: Self-pay | Admitting: Internal Medicine

## 2022-08-10 ENCOUNTER — Ambulatory Visit (INDEPENDENT_AMBULATORY_CARE_PROVIDER_SITE_OTHER): Payer: 59 | Admitting: *Deleted

## 2022-08-10 ENCOUNTER — Other Ambulatory Visit: Payer: Self-pay | Admitting: *Deleted

## 2022-08-10 DIAGNOSIS — E538 Deficiency of other specified B group vitamins: Secondary | ICD-10-CM

## 2022-08-10 DIAGNOSIS — E785 Hyperlipidemia, unspecified: Secondary | ICD-10-CM

## 2022-08-10 DIAGNOSIS — R7302 Impaired glucose tolerance (oral): Secondary | ICD-10-CM

## 2022-08-10 DIAGNOSIS — E559 Vitamin D deficiency, unspecified: Secondary | ICD-10-CM

## 2022-08-10 MED ORDER — CYANOCOBALAMIN 1000 MCG/ML IJ SOLN
1000.0000 ug | Freq: Once | INTRAMUSCULAR | Status: AC
  Administered 2022-08-10: 1000 ug via INTRAMUSCULAR

## 2022-08-10 NOTE — Telephone Encounter (Signed)
Left message on machine for patient.  See lab result note.

## 2022-08-10 NOTE — Telephone Encounter (Signed)
Returning call for results  °

## 2022-08-10 NOTE — Progress Notes (Signed)
Per orders of Dr. Hernandez, injection of B12 given by Tryce Surratt. Patient tolerated injection well.  

## 2022-08-17 ENCOUNTER — Ambulatory Visit (INDEPENDENT_AMBULATORY_CARE_PROVIDER_SITE_OTHER): Payer: 59 | Admitting: *Deleted

## 2022-08-17 DIAGNOSIS — E538 Deficiency of other specified B group vitamins: Secondary | ICD-10-CM

## 2022-08-17 MED ORDER — CYANOCOBALAMIN 1000 MCG/ML IJ SOLN
1000.0000 ug | Freq: Once | INTRAMUSCULAR | Status: AC
  Administered 2022-08-17: 1000 ug via INTRAMUSCULAR

## 2022-08-17 NOTE — Progress Notes (Signed)
Per orders of Dr. Hernandez, injection of B12 given by Nilam Quakenbush. Patient tolerated injection well.  

## 2022-08-24 ENCOUNTER — Ambulatory Visit (INDEPENDENT_AMBULATORY_CARE_PROVIDER_SITE_OTHER): Payer: 59 | Admitting: *Deleted

## 2022-08-24 DIAGNOSIS — E538 Deficiency of other specified B group vitamins: Secondary | ICD-10-CM | POA: Diagnosis not present

## 2022-08-24 MED ORDER — CYANOCOBALAMIN 1000 MCG/ML IJ SOLN
1000.0000 ug | Freq: Once | INTRAMUSCULAR | Status: AC
  Administered 2022-08-24: 1000 ug via INTRAMUSCULAR

## 2022-08-24 NOTE — Progress Notes (Signed)
Per orders of Dr. Jerilee Hoh, injection of b12 given by Westley Hummer. Patient tolerated injection well.

## 2022-08-31 ENCOUNTER — Ambulatory Visit (INDEPENDENT_AMBULATORY_CARE_PROVIDER_SITE_OTHER): Payer: 59 | Admitting: *Deleted

## 2022-08-31 DIAGNOSIS — E538 Deficiency of other specified B group vitamins: Secondary | ICD-10-CM | POA: Diagnosis not present

## 2022-08-31 MED ORDER — CYANOCOBALAMIN 1000 MCG/ML IJ SOLN
1000.0000 ug | Freq: Once | INTRAMUSCULAR | Status: AC
  Administered 2022-08-31: 1000 ug via INTRAMUSCULAR

## 2022-08-31 NOTE — Progress Notes (Signed)
Per orders of Dr. Hernandez, injection of B12 given by Denni France. Patient tolerated injection well.  

## 2022-09-20 LAB — HM MAMMOGRAPHY

## 2022-09-21 ENCOUNTER — Encounter: Payer: Self-pay | Admitting: Internal Medicine

## 2022-09-28 ENCOUNTER — Ambulatory Visit (INDEPENDENT_AMBULATORY_CARE_PROVIDER_SITE_OTHER): Payer: 59 | Admitting: *Deleted

## 2022-09-28 DIAGNOSIS — E538 Deficiency of other specified B group vitamins: Secondary | ICD-10-CM | POA: Diagnosis not present

## 2022-09-28 MED ORDER — CYANOCOBALAMIN 1000 MCG/ML IJ SOLN
1000.0000 ug | Freq: Once | INTRAMUSCULAR | Status: AC
  Administered 2022-09-28: 1000 ug via INTRAMUSCULAR

## 2022-09-28 NOTE — Progress Notes (Signed)
Per orders of Dr. Michael, injection of B12 given by Jakayden Cancio. Patient tolerated injection well.  

## 2022-10-26 ENCOUNTER — Ambulatory Visit (INDEPENDENT_AMBULATORY_CARE_PROVIDER_SITE_OTHER): Payer: 59 | Admitting: *Deleted

## 2022-10-26 DIAGNOSIS — E538 Deficiency of other specified B group vitamins: Secondary | ICD-10-CM | POA: Diagnosis not present

## 2022-10-26 MED ORDER — CYANOCOBALAMIN 1000 MCG/ML IJ SOLN
1000.0000 ug | Freq: Once | INTRAMUSCULAR | Status: AC
  Administered 2022-10-26: 1000 ug via INTRAMUSCULAR

## 2022-10-26 NOTE — Progress Notes (Signed)
Per orders of Dr. Hernandez, injection of B12 given by Ramces Shomaker. Patient tolerated injection well.  

## 2022-11-23 ENCOUNTER — Ambulatory Visit (INDEPENDENT_AMBULATORY_CARE_PROVIDER_SITE_OTHER): Payer: 59 | Admitting: *Deleted

## 2022-11-23 DIAGNOSIS — E538 Deficiency of other specified B group vitamins: Secondary | ICD-10-CM

## 2022-11-23 MED ORDER — CYANOCOBALAMIN 1000 MCG/ML IJ SOLN
1000.0000 ug | Freq: Once | INTRAMUSCULAR | Status: AC
  Administered 2022-11-23: 1000 ug via INTRAMUSCULAR

## 2022-11-23 NOTE — Progress Notes (Signed)
Per orders of Dr. Hernandez, injection of B12 given by Izac Faulkenberry. Patient tolerated injection well.  

## 2022-12-21 ENCOUNTER — Ambulatory Visit (INDEPENDENT_AMBULATORY_CARE_PROVIDER_SITE_OTHER): Payer: 59

## 2022-12-21 DIAGNOSIS — E538 Deficiency of other specified B group vitamins: Secondary | ICD-10-CM | POA: Diagnosis not present

## 2022-12-21 MED ORDER — CYANOCOBALAMIN 1000 MCG/ML IJ SOLN
1000.0000 ug | Freq: Once | INTRAMUSCULAR | Status: AC
  Administered 2022-12-21: 1000 ug via INTRAMUSCULAR

## 2022-12-21 NOTE — Progress Notes (Signed)
Per orders of Dr. Ardyth Harps, injection of cyanocobalamin Inj. 1000 mcg given by Vickii Chafe on Left Deltoid.  Patient tolerated injection well.   Pt states her last vitamin B12 injection is next month on 01/17/2023. Pt wants to know when will she come back for her lab recheck.   Last Vitamin B12 was drawn on 08/04/2022. Please advise.

## 2022-12-21 NOTE — Addendum Note (Signed)
Addended byVickii Chafe on: 12/21/2022 01:00 PM   Modules accepted: Orders

## 2023-01-17 ENCOUNTER — Ambulatory Visit (INDEPENDENT_AMBULATORY_CARE_PROVIDER_SITE_OTHER): Payer: 59

## 2023-01-17 DIAGNOSIS — E538 Deficiency of other specified B group vitamins: Secondary | ICD-10-CM | POA: Diagnosis not present

## 2023-01-17 MED ORDER — CYANOCOBALAMIN 1000 MCG/ML IJ SOLN
1000.0000 ug | Freq: Once | INTRAMUSCULAR | Status: AC
  Administered 2023-01-17: 1000 ug via INTRAMUSCULAR

## 2023-01-17 NOTE — Progress Notes (Signed)
Per orders of Philip Aspen, Limmie Patricia, MD, injection of B12 given in Right deltoid by Sherrin Daisy. Patient tolerated injection well.  Lab Results  Component Value Date   VITAMINB12 203 (L) 08/04/2022

## 2023-01-17 NOTE — Patient Instructions (Signed)
Health Maintenance Due  Topic Date Due   HIV Screening  Never done   Hepatitis C Screening  Never done   Zoster Vaccines- Shingrix (1 of 2) Never done   PAP SMEAR-Modifier  Never done   COVID-19 Vaccine (3 - Pfizer risk series) 12/02/2019      Row Labels 08/03/2022    7:02 AM 06/30/2016    2:50 PM 03/31/2016    9:15 AM  Depression screen PHQ 2/9   Section Header. No data exists in this row.     Decreased Interest   0 0 0  Down, Depressed, Hopeless   0 0 0  PHQ - 2 Score   0 0 0

## 2023-02-28 ENCOUNTER — Other Ambulatory Visit (INDEPENDENT_AMBULATORY_CARE_PROVIDER_SITE_OTHER): Payer: 59

## 2023-02-28 DIAGNOSIS — E559 Vitamin D deficiency, unspecified: Secondary | ICD-10-CM

## 2023-02-28 DIAGNOSIS — E538 Deficiency of other specified B group vitamins: Secondary | ICD-10-CM | POA: Diagnosis not present

## 2023-02-28 DIAGNOSIS — R7302 Impaired glucose tolerance (oral): Secondary | ICD-10-CM

## 2023-02-28 DIAGNOSIS — E785 Hyperlipidemia, unspecified: Secondary | ICD-10-CM | POA: Diagnosis not present

## 2023-02-28 LAB — LIPID PANEL
Cholesterol: 225 mg/dL — ABNORMAL HIGH (ref 0–200)
HDL: 66 mg/dL (ref >39.00)
LDL Cholesterol: 142 mg/dL — ABNORMAL HIGH (ref 0–99)
NonHDL: 158.95
Total CHOL/HDL Ratio: 3
Triglycerides: 87 mg/dL (ref 0.0–149.0)
VLDL: 17.4 mg/dL (ref 0.0–40.0)

## 2023-02-28 LAB — HEMOGLOBIN A1C: Hgb A1c MFr Bld: 5.9 % (ref 4.6–6.5)

## 2023-02-28 LAB — VITAMIN B12: Vitamin B-12: 323 pg/mL (ref 211–911)

## 2023-02-28 LAB — VITAMIN D 25 HYDROXY (VIT D DEFICIENCY, FRACTURES): VITD: 27.86 ng/mL — ABNORMAL LOW (ref 30.00–100.00)

## 2023-03-07 ENCOUNTER — Other Ambulatory Visit: Payer: Self-pay | Admitting: Internal Medicine

## 2023-03-07 DIAGNOSIS — E782 Mixed hyperlipidemia: Secondary | ICD-10-CM

## 2023-03-07 DIAGNOSIS — E559 Vitamin D deficiency, unspecified: Secondary | ICD-10-CM

## 2023-03-07 MED ORDER — ATORVASTATIN CALCIUM 20 MG PO TABS: 20.0000 mg | ORAL_TABLET | Freq: Every day | ORAL | 1 refills | Status: DC

## 2023-03-07 MED ORDER — VITAMIN D (ERGOCALCIFEROL) 1.25 MG (50000 UNIT) PO CAPS
50000.0000 [IU] | ORAL_CAPSULE | ORAL | 0 refills | Status: AC
Start: 2023-03-07 — End: 2023-05-24

## 2023-03-13 ENCOUNTER — Other Ambulatory Visit: Payer: Self-pay | Admitting: *Deleted

## 2023-03-13 DIAGNOSIS — E782 Mixed hyperlipidemia: Secondary | ICD-10-CM

## 2023-03-13 DIAGNOSIS — E559 Vitamin D deficiency, unspecified: Secondary | ICD-10-CM

## 2023-06-18 ENCOUNTER — Other Ambulatory Visit: Payer: 59

## 2023-06-20 ENCOUNTER — Other Ambulatory Visit (INDEPENDENT_AMBULATORY_CARE_PROVIDER_SITE_OTHER): Payer: 59

## 2023-06-20 DIAGNOSIS — E559 Vitamin D deficiency, unspecified: Secondary | ICD-10-CM | POA: Diagnosis not present

## 2023-06-20 DIAGNOSIS — E782 Mixed hyperlipidemia: Secondary | ICD-10-CM

## 2023-06-20 LAB — LIPID PANEL
Cholesterol: 142 mg/dL (ref 0–200)
HDL: 67.3 mg/dL (ref >39.00)
LDL Cholesterol: 59 mg/dL (ref 0–99)
NonHDL: 75.19
Total CHOL/HDL Ratio: 2
Triglycerides: 79 mg/dL (ref 0.0–149.0)
VLDL: 15.8 mg/dL (ref 0.0–40.0)

## 2023-06-20 LAB — VITAMIN D 25 HYDROXY (VIT D DEFICIENCY, FRACTURES): VITD: 46.12 ng/mL (ref 30.00–100.00)

## 2023-09-12 ENCOUNTER — Other Ambulatory Visit: Payer: Self-pay | Admitting: Internal Medicine

## 2023-09-12 DIAGNOSIS — E782 Mixed hyperlipidemia: Secondary | ICD-10-CM

## 2023-09-12 NOTE — Telephone Encounter (Signed)
 Copied from CRM [id number]. Topic: Clinical - Medication Refill >> Sep 12, 2023  9:59 AM Alcus Dad wrote: Most Recent Primary Care Visit:  Provider: [facility]  Department: [facility]  Visit Type: LAB  Date: 06/20/2023  Medication: atorvastatin (LIPITOR) 20 MG tablet  Has the patient contacted their pharmacy? Yes (Agent: If no, request that the patient contact the pharmacy for the refill. If patient does not wish to contact the pharmacy document the reason why and proceed with request.) (Agent: If yes, when and what did the pharmacy advise?)  Is this the correct pharmacy for this prescription? Yes If no, delete pharmacy and type the correct one.  This is the patient's preferred pharmacy:  [facility] [address], [place] - [address] [place] [address] Phone: [phone] Fax: [phone]   Has the prescription been filled recently? No  Is the patient out of the medication? No  Has the patient been seen for an appointment in the last year OR does the patient have an upcoming appointment? Yes  Can we respond through MyChart? Yes  Agent: Please be advised that Rx refills may take up to 3 business days. We ask that you follow-up with your pharmacy.

## 2023-09-20 ENCOUNTER — Telehealth: Payer: Self-pay | Admitting: *Deleted

## 2023-09-20 NOTE — Telephone Encounter (Signed)
 Copied from CRM [id number]. Topic: Clinical - Request for Lab/Test Order >> Sep 20, 2023  8:57 AM Lennart Pall wrote: Reason for CRM: Patient wanting to get a referral to [facility] to get a bone density test done. She was told last year that an order was going to get put in for it and it was not .

## 2023-09-20 NOTE — Telephone Encounter (Signed)
 Referral faxed

## 2023-12-11 ENCOUNTER — Other Ambulatory Visit: Payer: Self-pay | Admitting: Internal Medicine

## 2023-12-11 DIAGNOSIS — E782 Mixed hyperlipidemia: Secondary | ICD-10-CM

## 2024-03-10 ENCOUNTER — Other Ambulatory Visit: Payer: Self-pay | Admitting: Internal Medicine

## 2024-03-10 DIAGNOSIS — E782 Mixed hyperlipidemia: Secondary | ICD-10-CM

## 2024-03-11 ENCOUNTER — Other Ambulatory Visit: Payer: Self-pay | Admitting: Internal Medicine

## 2024-03-11 NOTE — Telephone Encounter (Unsigned)
 Copied from CRM #[id number]. Topic: Clinical - Medication Refill >> Mar 11, 2024  9:34 AM Tanazia G wrote: Medication: atorvastatin  (LIPITOR) 20 MG tablet  Has the patient contacted their pharmacy? Yes (Agent: If no, request that the patient contact the pharmacy for the refill. If patient does not wish to contact the pharmacy document the reason why and proceed with request.) (Agent: If yes, when and what did the pharmacy advise?)  This is the patient's preferred pharmacy:  [facility] [address], [place] - [address] [place] [address] Phone: [phone] Fax: [phone]  Is this the correct pharmacy for this prescription? Yes If no, delete pharmacy and type the correct one.   Has the prescription been filled recently? Yes  Is the patient out of the medication? Yes  Has the patient been seen for an appointment in the last year OR does the patient have an upcoming appointment? Yes  Can we respond through MyChart? Yes  Agent: Please be advised that Rx refills may take up to 3 business days. We ask that you follow-up with your pharmacy.

## 2024-03-12 ENCOUNTER — Telehealth: Payer: Self-pay | Admitting: Internal Medicine

## 2024-03-12 ENCOUNTER — Encounter: Payer: Self-pay | Admitting: Internal Medicine

## 2024-03-12 ENCOUNTER — Ambulatory Visit (INDEPENDENT_AMBULATORY_CARE_PROVIDER_SITE_OTHER): Admitting: Internal Medicine

## 2024-03-12 VITALS — BP 110/70 | HR 75 | Temp 97.8°F | Wt 171.4 lb

## 2024-03-12 DIAGNOSIS — E782 Mixed hyperlipidemia: Secondary | ICD-10-CM

## 2024-03-12 DIAGNOSIS — E559 Vitamin D deficiency, unspecified: Secondary | ICD-10-CM | POA: Diagnosis not present

## 2024-03-12 DIAGNOSIS — Z114 Encounter for screening for human immunodeficiency virus [HIV]: Secondary | ICD-10-CM

## 2024-03-12 DIAGNOSIS — Z1159 Encounter for screening for other viral diseases: Secondary | ICD-10-CM

## 2024-03-12 DIAGNOSIS — R7302 Impaired glucose tolerance (oral): Secondary | ICD-10-CM | POA: Diagnosis not present

## 2024-03-12 DIAGNOSIS — E538 Deficiency of other specified B group vitamins: Secondary | ICD-10-CM

## 2024-03-12 DIAGNOSIS — M5412 Radiculopathy, cervical region: Secondary | ICD-10-CM

## 2024-03-12 LAB — COMPREHENSIVE METABOLIC PANEL WITH GFR
ALT: 20 U/L (ref 0–35)
AST: 21 U/L (ref 0–37)
Albumin: 4.4 g/dL (ref 3.5–5.2)
Alkaline Phosphatase: 63 U/L (ref 39–117)
BUN: 16 mg/dL (ref 6–23)
CO2: 28 meq/L (ref 19–32)
Calcium: 9.4 mg/dL (ref 8.4–10.5)
Chloride: 107 meq/L (ref 96–112)
Creatinine, Ser: 0.81 mg/dL (ref 0.40–1.20)
GFR: 78.29 mL/min (ref >60.00)
Glucose, Bld: 117 mg/dL — ABNORMAL HIGH (ref 70–99)
Potassium: 4.4 meq/L (ref 3.5–5.1)
Sodium: 146 meq/L — ABNORMAL HIGH (ref 135–145)
Total Bilirubin: 0.7 mg/dL (ref 0.2–1.2)
Total Protein: 6.8 g/dL (ref 6.0–8.3)

## 2024-03-12 LAB — LIPID PANEL
Cholesterol: 138 mg/dL (ref 0–200)
HDL: 76 mg/dL (ref >39.00)
LDL Cholesterol: 50 mg/dL (ref 0–99)
NonHDL: 61.8
Total CHOL/HDL Ratio: 2
Triglycerides: 60 mg/dL (ref 0.0–149.0)
VLDL: 12 mg/dL (ref 0.0–40.0)

## 2024-03-12 LAB — CBC WITH DIFFERENTIAL/PLATELET
Basophils Absolute: 0 K/uL (ref 0.0–0.1)
Basophils Relative: 0.7 % (ref 0.0–3.0)
Eosinophils Absolute: 0.1 K/uL (ref 0.0–0.7)
Eosinophils Relative: 2.3 % (ref 0.0–5.0)
HCT: 37.8 % (ref 36.0–46.0)
Hemoglobin: 12.5 g/dL (ref 12.0–15.0)
Lymphocytes Relative: 38.7 % (ref 12.0–46.0)
Lymphs Abs: 1.5 K/uL (ref 0.7–4.0)
MCHC: 33.2 g/dL (ref 30.0–36.0)
MCV: 93.1 fl (ref 78.0–100.0)
Monocytes Absolute: 0.3 K/uL (ref 0.1–1.0)
Monocytes Relative: 7.5 % (ref 3.0–12.0)
Neutro Abs: 2 K/uL (ref 1.4–7.7)
Neutrophils Relative %: 50.8 % (ref 43.0–77.0)
Platelets: 204 K/uL (ref 150.0–400.0)
RBC: 4.06 Mil/uL (ref 3.87–5.11)
RDW: 13.4 % (ref 11.5–15.5)
WBC: 3.8 K/uL — ABNORMAL LOW (ref 4.0–10.5)

## 2024-03-12 LAB — POCT GLYCOSYLATED HEMOGLOBIN (HGB A1C): Hemoglobin A1C: 6 % — AB (ref 4.0–5.6)

## 2024-03-12 LAB — VITAMIN D 25 HYDROXY (VIT D DEFICIENCY, FRACTURES): VITD: 37.01 ng/mL (ref 30.00–100.00)

## 2024-03-12 LAB — VITAMIN B12: Vitamin B-12: 810 pg/mL (ref 211–911)

## 2024-03-12 MED ORDER — ATORVASTATIN CALCIUM 20 MG PO TABS
20.0000 mg | ORAL_TABLET | Freq: Every day | ORAL | 1 refills | Status: AC
Start: 2024-03-12 — End: ?

## 2024-03-12 NOTE — Assessment & Plan Note (Signed)
 On oral B12 daily, check levels today.

## 2024-03-12 NOTE — Assessment & Plan Note (Signed)
 Check lipids today, refill atorvastatin  20 mg daily.

## 2024-03-12 NOTE — Assessment & Plan Note (Signed)
 A1c is stable at 6.0, still in the prediabetic range.  Continue to work on lifestyle changes.

## 2024-03-12 NOTE — Addendum Note (Signed)
 Addended by: KATHRYNE MILLMAN B on: 03/12/2024 11:53 AM   Modules accepted: Orders

## 2024-03-12 NOTE — Telephone Encounter (Signed)
 Copied from CRM [id number]. Topic: Referral - Question >> Mar 12, 2024  9:55 AM Deleta RAMAN wrote: Reason for CRM: patient was seen on 8/27 received a referral for neurosurgery at [facility]. The patient rather see [facility] and [place] on [address]. Please contact patient at [phone]

## 2024-03-12 NOTE — Telephone Encounter (Signed)
 Request changed in referral.

## 2024-03-12 NOTE — Progress Notes (Signed)
 Established Patient Office Visit     CC/Reason for Visit: Follow-up chronic conditions, discuss acute concern  HPI: Michele Alvarez is a 63 y.o. female who is coming in today for the above mentioned reasons. Past Medical History is significant for: History of breast cancer followed by oncology, impaired glucose tolerance, hyperlipidemia, vitamin D  and B12 deficiencies.  She had a cervical fusion in 2001.  Lately she has been experiencing numbness around her neck and shoulder area radiating down into her hands.  Her previous neurosurgeon, Dr. Alix has now retired.   Past Medical/Surgical History: Past Medical History:  Diagnosis Date   Breast cancer ([facility])    Breast cancer of lower-outer quadrant of right female breast ([facility]) 08/19/2015   Chicken pox    History of radiation therapy 04/18/16- 05/30/16   Right Breast 50 Gy in 25 fractions. then boosted to 60 Gy in 5 fractions   Hot flashes    PONV (postoperative nausea and vomiting)     Past Surgical History:  Procedure Laterality Date   BACK SURGERY  2007, 2014   x2, lumbar surgery used same incision for both surgeries.    BREAST LUMPECTOMY WITH RADIOACTIVE SEED AND AXILLARY LYMPH NODE DISSECTION Right 03/08/2016   Procedure: RIGHT BREAST BRACKETED RADIOACTIVE SEED GUIDED LUMPECTOMY RIGHT AXILLARY SENTINEL LYMPH NODE BIOPSY AND RIGHT RADIOACTIVE SEED TARGETED AXILLARY NODE DISSECTION;  Surgeon: Donnice Bury, MD;  Location: [facility];  Service: General;  Laterality: Right;   NECK SURGERY  2001   PORT-A-CATH REMOVAL Right 03/08/2016   Procedure: REMOVAL PORT-A-CATH;  Surgeon: Donnice Bury, MD;  Location: [facility];  Service: General;  Laterality: Right;   PORTACATH PLACEMENT Right 09/09/2015   Procedure: INSERTION PORT-A-CATH WITH [place]  ;  Surgeon: Donnice Bury, MD;  Location: [facility];  Service: General;  Laterality: Right;    Social History:  reports that  she has never smoked. She has never used smokeless tobacco. She reports current alcohol use. She reports that she does not use drugs.  Allergies: Allergies  Allergen Reactions   Wound Dressing Adhesive Dermatitis    Reaction to dermabond, do not use   Latex Other (See Comments)    Fever blisters   Medical Adhesive Remover Dermatitis    Reaction to dermabond, do not use    Family History:  Family History  Problem Relation Age of Onset   Parkinson's disease Mother    COPD Father    Hyperlipidemia Sister    Cancer Brother    Cancer Maternal Grandmother    Hypertension Maternal Grandfather    Heart disease Maternal Grandfather    Hearing loss Maternal Grandfather    Heart attack Maternal Grandfather    Arthritis Paternal Grandmother    Colon cancer Neg Hx    Esophageal cancer Neg Hx    Rectal cancer Neg Hx    Stomach cancer Neg Hx      Current Outpatient Medications:    atorvastatin  (LIPITOR) 20 MG tablet, Take 1 tablet (20 mg total) by mouth daily., Disp: 90 tablet, Rfl: 1  Review of Systems:  Negative unless indicated in HPI.   Physical Exam: Vitals:   03/12/24 0728  BP: 110/70  Pulse: 75  Temp: 97.8 F (36.6 C)  TempSrc: Oral  SpO2: 98%  Weight: 171 lb 6.4 oz (77.7 kg)    Body mass index is 26.06 kg/m.   Physical Exam Vitals reviewed.  Constitutional:      Appearance: Normal appearance.  HENT:     Head: Normocephalic and atraumatic.  Eyes:     Conjunctiva/sclera: Conjunctivae normal.  Cardiovascular:     Rate and Rhythm: Normal rate and regular rhythm.  Pulmonary:     Effort: Pulmonary effort is normal.     Breath sounds: Normal breath sounds.  Skin:    General: Skin is warm and dry.  Neurological:     General: No focal deficit present.     Mental Status: She is alert and oriented to person, place, and time.  Psychiatric:        Mood and Affect: Mood normal.        Behavior: Behavior normal.        Thought Content: Thought content normal.         Judgment: Judgment normal.      Impression and Plan:  IGT (impaired glucose tolerance) Assessment & Plan: A1c is stable at 6.0, still in the prediabetic range.  Continue to work on lifestyle changes.  Orders: -     POCT glycosylated hemoglobin (Hb A1C)  Mixed hyperlipidemia Assessment & Plan: Check lipids today, refill atorvastatin  20 mg daily.  Orders: -     Atorvastatin  Calcium ; Take 1 tablet (20 mg total) by mouth daily.  Dispense: 90 tablet; Refill: 1 -     Comprehensive metabolic panel with GFR; Future -     CBC with Differential/Platelet; Future -     Lipid panel; Future  Vitamin D  deficiency Assessment & Plan: Check levels today.  Orders: -     VITAMIN D  25 Hydroxy (Vit-D Deficiency, Fractures); Future  Vitamin B12 deficiency Assessment & Plan: On oral B12 daily, check levels today.  Orders: -     Vitamin B12; Future  Cervical radiculopathy -     Ambulatory referral to Neurosurgery  Encounter for hepatitis C screening test for low risk patient -     Hepatitis C antibody; Future  Encounter for screening for HIV -     HIV Antibody (routine testing w rflx); Future   - Will send back to neurosurgery, may need imaging of her cervical spine again.  Time spent:30 minutes reviewing chart, interviewing and examining patient and formulating plan of care.     Tully Theophilus Andrews, MD [facility]

## 2024-03-12 NOTE — Assessment & Plan Note (Signed)
 Check levels today.

## 2024-03-13 ENCOUNTER — Ambulatory Visit: Payer: Self-pay | Admitting: Internal Medicine

## 2024-03-13 DIAGNOSIS — M5412 Radiculopathy, cervical region: Secondary | ICD-10-CM

## 2024-03-13 DIAGNOSIS — M542 Cervicalgia: Secondary | ICD-10-CM

## 2024-03-13 DIAGNOSIS — R7302 Impaired glucose tolerance (oral): Secondary | ICD-10-CM

## 2024-03-13 LAB — HIV ANTIBODY (ROUTINE TESTING W REFLEX): HIV 1&2 Ab, 4th Generation: NONREACTIVE

## 2024-03-13 LAB — HEPATITIS C ANTIBODY: Hepatitis C Ab: NONREACTIVE

## 2024-03-27 NOTE — Telephone Encounter (Signed)
 MRI ordered

## 2024-03-27 NOTE — Telephone Encounter (Signed)
 Spoke with the patient and she states that she received a call from [facility] that an MRI should be done by Dr Theophilus before her appointment with Dr Tess on 04/17/24.  She would like it done at [facility] on [address].

## 2024-04-09 ENCOUNTER — Ambulatory Visit
Admission: RE | Admit: 2024-04-09 | Discharge: 2024-04-09 | Disposition: A | Discharge status: Home / Self Care | Source: Ambulatory Visit | Attending: Internal Medicine | Admitting: Internal Medicine

## 2024-04-09 DIAGNOSIS — M542 Cervicalgia: Secondary | ICD-10-CM

## 2024-04-09 DIAGNOSIS — M5412 Radiculopathy, cervical region: Secondary | ICD-10-CM

## 2024-04-10 ENCOUNTER — Ambulatory Visit: Payer: Self-pay | Admitting: Internal Medicine

## 2024-04-21 NOTE — Therapy (Signed)
 OUTPATIENT PHYSICAL THERAPY CERVICAL EVALUATION   Patient Name: Michele Alvarez MRN: [id number] DOB:1960-12-19, 63 y.o., female Today's Date: 04/22/2024  END OF SESSION:  PT End of Session - 04/22/24 0841     Visit Number 1    Date for Recertification  06/17/24    Authorization Type UHC    PT Start Time 0800    PT Stop Time 0841    PT Time Calculation (min) 41 min    Activity Tolerance Patient tolerated treatment well    Behavior During Therapy [facility] for tasks assessed/performed          Past Medical History:  Diagnosis Date   Breast cancer (HCC)    Breast cancer of lower-outer quadrant of right female breast (HCC) 08/19/2015   Chicken pox    History of radiation therapy 04/18/16- 05/30/16   Right Breast 50 Gy in 25 fractions. then boosted to 60 Gy in 5 fractions   Hot flashes    PONV (postoperative nausea and vomiting)    Past Surgical History:  Procedure Laterality Date   BACK SURGERY  2007, 2014   x2, lumbar surgery used same incision for both surgeries.    BREAST LUMPECTOMY WITH RADIOACTIVE SEED AND AXILLARY LYMPH NODE DISSECTION Right 03/08/2016   Procedure: RIGHT BREAST BRACKETED RADIOACTIVE SEED GUIDED LUMPECTOMY RIGHT AXILLARY SENTINEL LYMPH NODE BIOPSY AND RIGHT RADIOACTIVE SEED TARGETED AXILLARY NODE DISSECTION;  Surgeon: Donnice Bury, MD;  Location: [facility];  Service: General;  Laterality: Right;   NECK SURGERY  2001   PORT-A-CATH REMOVAL Right 03/08/2016   Procedure: REMOVAL PORT-A-CATH;  Surgeon: Donnice Bury, MD;  Location: [facility];  Service: General;  Laterality: Right;   PORTACATH PLACEMENT Right 09/09/2015   Procedure: INSERTION PORT-A-CATH WITH US  ;  Surgeon: Donnice Bury, MD;  Location: [facility];  Service: General;  Laterality: Right;   Patient Active Problem List   Diagnosis Date Noted   Vitamin D  deficiency 08/07/2022   Vitamin B12 deficiency 08/07/2022   Hyperlipidemia  08/07/2022   IGT (impaired glucose tolerance) 08/07/2022   Lipoma of right shoulder 11/02/2020   Breast cancer screening, high risk patient 12/15/2017   Inconclusive mammogram due to dense breasts 12/15/2017   Osteopenia determined by x-ray 01/09/2017   Malignant neoplasm of lower-outer quadrant of right breast of female, estrogen receptor positive (HCC) 08/19/2015    PCP: Theophilus Andrews, Tully GRADE, MD   REFERRING PROVIDER: Onetha Kuba, MD   REFERRING DIAG: [phone] (ICD-10-CM) - Radiculopathy, cervical region   THERAPY DIAG:  Radiculopathy, cervical region  Rationale for Evaluation and Treatment: Rehabilitation  ONSET DATE: 2 months ago  SUBJECTIVE:  SUBJECTIVE STATEMENT: Tingling going down R arm to digits 1 and 2 when at computer. Occasionally pins and needles in the L fingers. In the past few weeks moving a storage unit. I have had stabbing pain intermittently in thoracic spine, but that has resolved. Intermittent sharp pains in shoulder blade.  Not much pain, more tingling and burning.  Mouse uses R hand. Left is dominant. Hand dominance: Ambidextrous  PERTINENT HISTORY:  ACDF C6/7 2001, R lumpectomy and one axillary node dissection with chemo and radiation,  two S1/L5 surgeries  PAIN:  Are you having pain? Yes: NPRS scale: can't rate Pain location: R arrm to fingers 1 and 2, some L hand Pain description: tingling and burning Aggravating factors: bending to clean litter box, using mouse on computer, driving Relieving factors: no  PRECAUTIONS: Other: R breast CA  RED FLAGS: None     WEIGHT BEARING RESTRICTIONS: No  FALLS:  Has patient fallen in last 6 months? No  LIVING ENVIRONMENT: Lives with: lives with their family  OCCUPATION: Primarily sitting at computer, but  up and down a lot  PLOF: Independent  PATIENT GOALS: not sure  NEXT MD VISIT: 06/03/24  OBJECTIVE:  Note: Objective measures were completed at Evaluation unless otherwise noted.  DIAGNOSTIC FINDINGS:  MRI IMPRESSION: 1. Prior ACDF at C6-7 without residual or recurrent stenosis. 2. Right paracentral disc osteophyte complex at C4-5 with resultant mild canal and moderate right worse than left C5 foraminal stenosis. 3. Right eccentric disc osteophyte complex at C5-6 with resultant mild canal and bilateral C6 foraminal stenosis. 4. Shallow left paracentral disc protrusion at C7-T1 without significant stenosis.  PATIENT SURVEYS:  THE PATIENT SPECIFIC FUNCTIONAL SCALE  Place score of 0-10 (0 = unable to perform activity and 10 = able to perform activity at the same level as before injury or problem)  Activity Date: 04/22/24    Using a mouse  9    2. Emptying litter box 9    3. Driving 9    4.      Total Score 9      Total Score = Sum of activity scores/number of activities  Minimally Detectable Change: 3 points (for single activity); 2 points (for average score)  [facility] (nd). The Patient Specific Functional Scale . Retrieved from [email or web address]  COGNITION: Overall cognitive status: Within functional limits for tasks assessed  SENSATION: Not tested  POSTURE: rounded shoulders  PALPATION: Mild UT tightness, R cervical paraspinals TTP   CERVICAL ROM:   Active ROM A/PROM (deg) eval  Flexion 45  Extension 32  Right lateral flexion 35  Left lateral flexion 22 some sx  Right rotation 55  Left rotation 50   (Blank rows = not tested)  UPPER EXTREMITY ROM: WNL  UPPER EXTREMITY MMT:R UE 4+/5 except elbow flexion 5/5, L 5/5  MMT Right eval Left eval  Grip strength 52# 53#   (Blank rows = not tested)  CERVICAL SPECIAL TESTS:  Upper limb tension test (ULTT): Positive, Spurling's test:  Positive, and Distraction test: Negative    TREATMENT DATE:  04/22/24 See pt ed and HEP    PATIENT EDUCATION:  Education details: PT eval findings, anticipated POC, initial HEP, and use of towel roll in pillow   Person educated: Patient Education method: Explanation, Demonstration, and Handouts Education comprehension: verbalized understanding and returned demonstration  HOME EXERCISE PROGRAM: Access Code: [email or web address] URL: [email or web address] Date: 04/22/2024 Prepared by: Mliss  Exercises - Seated Cervical Retraction  - 2 x daily - 7 x weekly - 1 sets - 10 reps - 5 hold - Seated Cervical Retraction and Extension  - 1 x daily - 3 x weekly - 2 sets - 10 reps - Seated Scapular Retraction  - 1 x daily - 7 x weekly - 1-3 sets - 10 reps - 2-3 sec hold - Seated Cervical Rotation AROM  - 1 x daily - 7 x weekly - 1 sets - 10 reps - 5 hold - Seated Cervical Sidebending AROM  - 1 x daily - 7 x weekly - 1 sets - 10 reps - 5 hold - Seated Cervical Flexion AROM  - 1 x daily - 7 x weekly - 1 sets - 10 reps - 5 hold  ASSESSMENT:  CLINICAL IMPRESSION: Patient is a 63 y.o. female who was seen today for physical therapy evaluation and treatment for cervical radiculopathy R > L. She has a h/o of C6/7 ACDF. She denies pain, but reports tingling and burning down her arm to digits 1 and 2 and intermittently to her L fingers. She has limited cervical ROM, slight weakness in her L UE. She has positive spurling's test B and positve median nerve tension on the R. Symptoms occur primarily with using a mouse, driving and with cleaning her cat's litter box ( or similar aciviites). She will benefit from skilled PT to address these deficits and those listed below.   OBJECTIVE IMPAIRMENTS: decreased activity tolerance, decreased ROM, decreased strength, increased muscle spasms,  impaired UE functional use, postural dysfunction, and pain.   ACTIVITY LIMITATIONS: bending and reaching down or outward  PARTICIPATION LIMITATIONS: painful not limited  PERSONAL FACTORS: Age, Time since onset of injury/illness/exacerbation, and 1 comorbidity: see MRI above are also affecting patient's functional outcome.   REHAB POTENTIAL: Good  CLINICAL DECISION MAKING: Evolving/moderate complexity  EVALUATION COMPLEXITY: Low   GOALS: Goals reviewed with patient? Yes  SHORT TERM GOALS: Target date: 05/20/2024   Patient will be independent with initial HEP.  Baseline:  Goal status: INITIAL 2.  Pt to report 50% improvement in centralization of sx  Baseline:  Goal status: INITIAL   LONG TERM GOALS: Target date: 06/17/2024   Patient will be independent with advanced/ongoing HEP to improve outcomes and carryover.  Baseline:  Goal status: INITIAL  2.  Patient will report centralization of sx > 80% with cleaning litter box and using the mouse.  Baseline:  Goal status: INITIAL  3.  Patient will demonstrate improved left lateral cervical flexion by 5- 10 degrees without sx provocation.  Baseline:  Goal status:INITIAL     PLAN:  PT FREQUENCY: 1-2x/week  PT DURATION: 8 weeks  PLANNED INTERVENTIONS: 97164- PT Re-evaluation, 97110-Therapeutic exercises, 97530- Therapeutic activity, [id number]- Neuromuscular re-education, 97535- Self Care, 02859- Manual therapy, [id number]- Electrical stimulation (unattended), 20560 (1-2 muscles), 20561 (3+ muscles)- Dry Needling, Patient/Family education, Joint mobilization, Spinal mobilization, Cryotherapy, and Moist heat  PLAN FOR NEXT SESSION: assess response to retractions, add medial nerve glide, assess spinal mobility, neck stabilization.    Mliss Cummins, PT 04/22/24 12:04 PM

## 2024-04-22 ENCOUNTER — Encounter: Payer: Self-pay | Admitting: Physical Therapy

## 2024-04-22 ENCOUNTER — Ambulatory Visit: Attending: Neurosurgery | Admitting: Physical Therapy

## 2024-04-22 ENCOUNTER — Other Ambulatory Visit: Payer: Self-pay

## 2024-04-22 DIAGNOSIS — M5412 Radiculopathy, cervical region: Secondary | ICD-10-CM | POA: Insufficient documentation

## 2024-05-01 ENCOUNTER — Ambulatory Visit

## 2024-05-01 DIAGNOSIS — M5412 Radiculopathy, cervical region: Secondary | ICD-10-CM

## 2024-05-01 NOTE — Therapy (Signed)
 OUTPATIENT PHYSICAL THERAPY CERVICAL EVALUATION   Patient Name: Michele Alvarez MRN: [id number] DOB:July 22, 1960, 63 y.o., female Today's Date: 05/01/2024  END OF SESSION:  PT End of Session - 05/01/24 0829     Visit Number 2    Date for Recertification  06/17/24    Authorization Type UHC no auth req'd 23 VL    Authorization - Visit Number 2    Authorization - Number of Visits 23    PT Start Time [phone]    PT Stop Time 0820    PT Time Calculation (min) 49 min    Activity Tolerance Patient tolerated treatment well    Behavior During Therapy WFL for tasks assessed/performed           Past Medical History:  Diagnosis Date   Breast cancer (HCC)    Breast cancer of lower-outer quadrant of right female breast (HCC) 08/19/2015   Chicken pox    History of radiation therapy 04/18/16- 05/30/16   Right Breast 50 Gy in 25 fractions. then boosted to 60 Gy in 5 fractions   Hot flashes    PONV (postoperative nausea and vomiting)    Past Surgical History:  Procedure Laterality Date   BACK SURGERY  2007, 2014   x2, lumbar surgery used same incision for both surgeries.    BREAST LUMPECTOMY WITH RADIOACTIVE SEED AND AXILLARY LYMPH NODE DISSECTION Right 03/08/2016   Procedure: RIGHT BREAST BRACKETED RADIOACTIVE SEED GUIDED LUMPECTOMY RIGHT AXILLARY SENTINEL LYMPH NODE BIOPSY AND RIGHT RADIOACTIVE SEED TARGETED AXILLARY NODE DISSECTION;  Surgeon: Donnice Bury, MD;  Location: [facility];  Service: General;  Laterality: Right;   NECK SURGERY  2001   PORT-A-CATH REMOVAL Right 03/08/2016   Procedure: REMOVAL PORT-A-CATH;  Surgeon: Donnice Bury, MD;  Location: [facility];  Service: General;  Laterality: Right;   PORTACATH PLACEMENT Right 09/09/2015   Procedure: INSERTION PORT-A-CATH WITH US  ;  Surgeon: Donnice Bury, MD;  Location: [facility];  Service: General;  Laterality: Right;   Patient Active Problem List   Diagnosis Date Noted    Vitamin D  deficiency 08/07/2022   Vitamin B12 deficiency 08/07/2022   Hyperlipidemia 08/07/2022   IGT (impaired glucose tolerance) 08/07/2022   Lipoma of right shoulder 11/02/2020   Breast cancer screening, high risk patient 12/15/2017   Inconclusive mammogram due to dense breasts 12/15/2017   Osteopenia determined by x-ray 01/09/2017   Malignant neoplasm of lower-outer quadrant of right breast of female, estrogen receptor positive (HCC) 08/19/2015    PCP: Theophilus Andrews, Tully GRADE, MD   REFERRING PROVIDER: Onetha Kuba, MD   REFERRING DIAG: [phone] (ICD-10-CM) - Radiculopathy, cervical region   THERAPY DIAG:  Radiculopathy, cervical region  Rationale for Evaluation and Treatment: Rehabilitation  ONSET DATE: 2 months ago  SUBJECTIVE:  SUBJECTIVE STATEMENT: I feel about the same. I've been doing the exercises and working on my posture when I drive and am on my phone.  Rt tingling/sensation remains.     05/01/24: Tingling going down R arm to digits 1 and 2 when at computer. Occasionally pins and needles in the L fingers. In the past few weeks moving a storage unit. I have had stabbing pain intermittently in thoracic spine, but that has resolved. Intermittent sharp pains in shoulder blade.  Not much pain, more tingling and burning.  Mouse uses R hand. Left is dominant. Hand dominance: Ambidextrous  PERTINENT HISTORY:  ACDF C6/7 2001, R lumpectomy and one axillary node dissection with chemo and radiation,  two S1/L5 surgeries  PAIN:  Are you having pain? Yes: NPRS scale: can't rate Pain location: R arrm to fingers 1 and 2, some L hand Pain description: tingling and burning Aggravating factors: bending to clean litter box, using mouse on computer, driving Relieving factors:  no  PRECAUTIONS: Other: R breast CA  RED FLAGS: None     WEIGHT BEARING RESTRICTIONS: No  FALLS:  Has patient fallen in last 6 months? No  LIVING ENVIRONMENT: Lives with: lives with their family  OCCUPATION: Primarily sitting at computer, but up and down a lot  PLOF: Independent  PATIENT GOALS: not sure  NEXT MD VISIT: 06/03/24  OBJECTIVE:  Note: Objective measures were completed at Evaluation unless otherwise noted.  DIAGNOSTIC FINDINGS:  MRI IMPRESSION: 1. Prior ACDF at C6-7 without residual or recurrent stenosis. 2. Right paracentral disc osteophyte complex at C4-5 with resultant mild canal and moderate right worse than left C5 foraminal stenosis. 3. Right eccentric disc osteophyte complex at C5-6 with resultant mild canal and bilateral C6 foraminal stenosis. 4. Shallow left paracentral disc protrusion at C7-T1 without significant stenosis.  PATIENT SURVEYS:  THE PATIENT SPECIFIC FUNCTIONAL SCALE  Place score of 0-10 (0 = unable to perform activity and 10 = able to perform activity at the same level as before injury or problem)  Activity Date: 04/22/24    Using a mouse  9    2. Emptying litter box 9    3. Driving 9    4.      Total Score 9      Total Score = Sum of activity scores/number of activities  Minimally Detectable Change: 3 points (for single activity); 2 points (for average score)  [facility] (nd). The Patient Specific Functional Scale . Retrieved from [email or web address]  COGNITION: Overall cognitive status: Within functional limits for tasks assessed  SENSATION: Not tested  POSTURE: rounded shoulders  PALPATION: Mild UT tightness, R cervical paraspinals TTP   CERVICAL ROM:   Active ROM A/PROM (deg) eval  Flexion 45  Extension 32  Right lateral flexion 35  Left lateral flexion 22 some sx  Right rotation 55  Left rotation 50   (Blank rows = not tested)  UPPER  EXTREMITY ROM: WNL  UPPER EXTREMITY MMT:R UE 4+/5 except elbow flexion 5/5, L 5/5  MMT Right eval Left eval  Grip strength 52# 53#   (Blank rows = not tested)  CERVICAL SPECIAL TESTS:  Upper limb tension test (ULTT): Positive, Spurling's test: Positive, and Distraction test: Negative    TREATMENT DATE:  05/01/24:  Arm bike: level 1.7 x 6 min-3/3- PT present to discuss progress  Cervical retraction 5 hold x10 Cervical A/ROM 3 ways x5 each Median nerve glides on Rt x10 Wall clocks: red band x5 bil Seated ER with red band 2x10 Manual: PA mobs, lateral glides and mobs with movement to cervical spine   04/22/24 See pt ed and HEP    PATIENT EDUCATION:  Education details: PT eval findings, anticipated POC, initial HEP, and use of towel roll in pillow   Person educated: Patient Education method: Explanation, Demonstration, and Handouts Education comprehension: verbalized understanding and returned demonstration  HOME EXERCISE PROGRAM: Access Code: [email or web address] URL: [email or web address] Date: 05/01/2024 Prepared by: Burnard  Exercises - Seated Cervical Retraction  - 2 x daily - 7 x weekly - 1 sets - 10 reps - 5 hold - Seated Cervical Retraction and Extension  - 1 x daily - 3 x weekly - 2 sets - 10 reps - Seated Scapular Retraction  - 1 x daily - 7 x weekly - 1-3 sets - 10 reps - 2-3 sec hold - Seated Cervical Rotation AROM  - 1 x daily - 7 x weekly - 1 sets - 10 reps - 5 hold - Seated Cervical Sidebending AROM  - 1 x daily - 7 x weekly - 1 sets - 10 reps - 5 hold - Seated Cervical Flexion AROM  - 1 x daily - 7 x weekly - 1 sets - 10 reps - 5 hold - Median Nerve Flossing - Tray  - 1 x daily - 7 x weekly - 3 sets - 10 reps - Wall Clock with Theraband  - 1 x daily - 7 x weekly - 2 sets - 5 reps - Shoulder External Rotation and Scapular Retraction  with Resistance  - 1 x daily - 7 x weekly - 2 sets - 10 reps  ASSESSMENT:  CLINICAL IMPRESSION: First time follow-up after evaluation.  Review of all HEP and emphasis on postural alignment while performing.  She denies any change in symptoms overall and no increase in pain with exercises today.  Pt was challenged with wall clocks and PT issued both yellow and red bands.  Discussed dry needling benefits and cost and she will consider for next treatment.  She will benefit from skilled PT to address these deficits and those listed below.   OBJECTIVE IMPAIRMENTS: decreased activity tolerance, decreased ROM, decreased strength, increased muscle spasms, impaired UE functional use, postural dysfunction, and pain.   ACTIVITY LIMITATIONS: bending and reaching down or outward  PARTICIPATION LIMITATIONS: painful not limited  PERSONAL FACTORS: Age, Time since onset of injury/illness/exacerbation, and 1 comorbidity: see MRI above are also affecting patient's functional outcome.   REHAB POTENTIAL: Good  CLINICAL DECISION MAKING: Evolving/moderate complexity  EVALUATION COMPLEXITY: Low   GOALS: Goals reviewed with patient? Yes  SHORT TERM GOALS: Target date: 05/20/2024   Patient will be independent with initial HEP.  Baseline:  Goal status: INITIAL 2.  Pt to report 50% improvement in centralization of sx  Baseline:  Goal status: INITIAL   LONG TERM GOALS: Target date: 06/17/2024   Patient will be independent with advanced/ongoing HEP to improve outcomes and carryover.  Baseline:  Goal status: INITIAL  2.  Patient will report centralization of sx > 80% with cleaning litter box and using the mouse.  Baseline:  Goal status: INITIAL  3.  Patient will demonstrate improved left lateral cervical flexion by 5- 10 degrees without sx provocation.  Baseline:  Goal status:INITIAL  PLAN:  PT FREQUENCY: 1-2x/week  PT DURATION: 8 weeks  PLANNED INTERVENTIONS: 97164- PT Re-evaluation,  97110-Therapeutic exercises, 97530- Therapeutic activity, [id number]- Neuromuscular re-education, 97535- Self Care, 02859- Manual therapy, G0283- Electrical stimulation (unattended), 20560 (1-2 muscles), 20561 (3+ muscles)- Dry Needling, Patient/Family education, Joint mobilization, Spinal mobilization, Cryotherapy, and Moist heat  PLAN FOR NEXT SESSION: assess response to retractions, add medial nerve glide, assess spinal mobility, neck stabilization.    Burnard Joy, PT 05/01/24 8:30 AM

## 2024-05-15 ENCOUNTER — Encounter: Admitting: Rehabilitative and Restorative Service Providers"

## 2024-05-22 ENCOUNTER — Encounter: Payer: Self-pay | Admitting: Rehabilitative and Restorative Service Providers"

## 2024-05-22 ENCOUNTER — Ambulatory Visit: Attending: Neurosurgery | Admitting: Rehabilitative and Restorative Service Providers"

## 2024-05-22 DIAGNOSIS — M5412 Radiculopathy, cervical region: Secondary | ICD-10-CM | POA: Diagnosis present

## 2024-05-22 NOTE — Therapy (Signed)
 OUTPATIENT PHYSICAL THERAPY TREATMENT NOTE AND DISCHARGE SUMMARY   Patient Name: Michele Alvarez MRN: [id number] DOB:Jan 13, 1961, 63 y.o., female Today's Date: 05/22/2024  END OF SESSION:  PT End of Session - 05/22/24 0738     Visit Number 3    Date for Recertification  06/17/24    Authorization Type [employer] no auth req'd 23 VL    Authorization - Visit Number 3    Authorization - Number of Visits 23    PT Start Time [phone]    PT Stop Time 0800    PT Time Calculation (min) 29 min    Activity Tolerance Patient tolerated treatment well    Behavior During Therapy WFL for tasks assessed/performed           Past Medical History:  Diagnosis Date   Breast cancer (HCC)    Breast cancer of lower-outer quadrant of right female breast (HCC) 08/19/2015   Chicken pox    History of radiation therapy 04/18/16- 05/30/16   Right Breast 50 Gy in 25 fractions. then boosted to 60 Gy in 5 fractions   Hot flashes    PONV (postoperative nausea and vomiting)    Past Surgical History:  Procedure Laterality Date   BACK SURGERY  2007, 2014   x2, lumbar surgery used same incision for both surgeries.    BREAST LUMPECTOMY WITH RADIOACTIVE SEED AND AXILLARY LYMPH NODE DISSECTION Right 03/08/2016   Procedure: RIGHT BREAST BRACKETED RADIOACTIVE SEED GUIDED LUMPECTOMY RIGHT AXILLARY SENTINEL LYMPH NODE BIOPSY AND RIGHT RADIOACTIVE SEED TARGETED AXILLARY NODE DISSECTION;  Surgeon: Donnice Bury, MD;  Location: [facility];  Service: General;  Laterality: Right;   NECK SURGERY  2001   PORT-A-CATH REMOVAL Right 03/08/2016   Procedure: REMOVAL PORT-A-CATH;  Surgeon: Donnice Bury, MD;  Location: [facility];  Service: General;  Laterality: Right;   PORTACATH PLACEMENT Right 09/09/2015   Procedure: INSERTION PORT-A-CATH WITH US  ;  Surgeon: Donnice Bury, MD;  Location: [facility];  Service: General;  Laterality: Right;   Patient Active Problem List    Diagnosis Date Noted   Vitamin D  deficiency 08/07/2022   Vitamin B12 deficiency 08/07/2022   Hyperlipidemia 08/07/2022   IGT (impaired glucose tolerance) 08/07/2022   Lipoma of right shoulder 11/02/2020   Breast cancer screening, high risk patient 12/15/2017   Inconclusive mammogram due to dense breasts 12/15/2017   Osteopenia determined by x-ray 01/09/2017   Malignant neoplasm of lower-outer quadrant of right breast of female, estrogen receptor positive (HCC) 08/19/2015    PCP: Theophilus Andrews, Tully GRADE, MD   REFERRING PROVIDER: Onetha Kuba, MD   REFERRING DIAG: [phone] (ICD-10-CM) - Radiculopathy, cervical region   THERAPY DIAG:  Radiculopathy, cervical region  Rationale for Evaluation and Treatment: Rehabilitation  ONSET DATE: 2 months ago  SUBJECTIVE:  SUBJECTIVE STATEMENT: Patient reports that her neck is overall, feeling better and she is having less radicular symptoms.  Patient reports overall, feeling 80% better since taking PT.  Patient reports that her largest concern now is her leg where she had a recent procedure and does not feel that it is healing appropriately and the MD sent off a sample to culture for possible infection.  Patient states that she wants to discharge from PT for her neck today to continue with PT.  Hand dominance: Ambidextrous  PERTINENT HISTORY:  ACDF C6/7 2001, R lumpectomy and one axillary node dissection with chemo and radiation,  two S1/L5 surgeries  PAIN:  Are you having pain? No  PRECAUTIONS: Other: R breast CA  RED FLAGS: None     WEIGHT BEARING RESTRICTIONS: No  FALLS:  Has patient fallen in last 6 months? No  LIVING ENVIRONMENT: Lives with: lives with their family  OCCUPATION: Primarily sitting at computer, but up and down a  lot  PLOF: Independent  PATIENT GOALS: not sure  NEXT MD VISIT: 06/03/24  OBJECTIVE:  Note: Objective measures were completed at Evaluation unless otherwise noted.  DIAGNOSTIC FINDINGS:  MRI IMPRESSION: 1. Prior ACDF at C6-7 without residual or recurrent stenosis. 2. Right paracentral disc osteophyte complex at C4-5 with resultant mild canal and moderate right worse than left C5 foraminal stenosis. 3. Right eccentric disc osteophyte complex at C5-6 with resultant mild canal and bilateral C6 foraminal stenosis. 4. Shallow left paracentral disc protrusion at C7-T1 without significant stenosis.  PATIENT SURVEYS:  THE PATIENT SPECIFIC FUNCTIONAL SCALE  Place score of 0-10 (0 = unable to perform activity and 10 = able to perform activity at the same level as before injury or problem)  Activity Date: 04/22/24 Date:  05/22/24  Using a mouse  9 9.5  2. Emptying litter box 9 9.5  3. Driving 9 9.5  4.     Total Score 9 9.5    Total Score = Sum of activity scores/number of activities  Minimally Detectable Change: 3 points (for single activity); 2 points (for average score)  [facility] (nd). The Patient Specific Functional Scale . Retrieved from [email or web address]  COGNITION: Overall cognitive status: Within functional limits for tasks assessed  SENSATION: Not tested  POSTURE: rounded shoulders  PALPATION: Mild UT tightness, R cervical paraspinals TTP   CERVICAL ROM:   Active ROM A/PROM (deg) eval A/ROM 05/22/24  Flexion 45 60  Extension 32 45  Right lateral flexion 35 40  Left lateral flexion 22 some sx 40  Right rotation 55   Left rotation 50    (Blank rows = not tested)  UPPER EXTREMITY ROM: WNL  UPPER EXTREMITY MMT:R UE 4+/5 except elbow flexion 5/5, L 5/5  MMT Right eval Left eval  Grip strength 52# 53#   (Blank rows = not tested)  CERVICAL SPECIAL TESTS:  Upper limb tension test (ULTT):  Positive, Spurling's test: Positive, and Distraction test: Negative    TREATMENT DATE:  05/22/2024: UBE level 1.2 x2.5 min each direction with PT present to discuss status Standing 3 way scapular stabilization with yellow loop 2x5 bilat Standing cervical retraction with red tband 2x10 Standing shoulder ER with green tband 2x10 Standing shoulder horizontal abduction with green tband 2x10 Standing lower trap lift off with red tband 2x10   05/01/24:  Arm bike: level 1.7 x 6 min-3/3- PT present to discuss progress  Cervical retraction 5 hold x10 Cervical A/ROM 3 ways x5 each Median nerve glides on Rt x10 Wall clocks: red band x5 bil Seated ER with red band 2x10 Manual: PA mobs, lateral glides and mobs with movement to cervical spine   04/22/24 See pt ed and HEP    PATIENT EDUCATION:  Education details: PT eval findings, anticipated POC, initial HEP, and use of towel roll in pillow   Person educated: Patient Education method: Explanation, Demonstration, and Handouts Education comprehension: verbalized understanding and returned demonstration  HOME EXERCISE PROGRAM:  Access Code: [email or web address] URL: [email or web address] Date: 05/22/2024 Prepared by: Jarrell Mehak Roskelley  Exercises - Seated Cervical Retraction  - 2 x daily - 7 x weekly - 1 sets - 10 reps - 5 hold - Seated Cervical Retraction and Extension  - 1 x daily - 3 x weekly - 2 sets - 10 reps - Seated Scapular Retraction  - 1 x daily - 7 x weekly - 1-3 sets - 10 reps - 2-3 sec hold - Seated Cervical Rotation AROM  - 1 x daily - 7 x weekly - 1 sets - 10 reps - 5 hold - Seated Cervical Sidebending AROM  - 1 x daily - 7 x weekly - 1 sets - 10 reps - 5 hold - Seated Cervical Flexion AROM  - 1 x daily - 7 x weekly - 1 sets - 10 reps - 5 hold - Median Nerve Flossing - Tray  - 1 x daily - 7 x weekly -  3 sets - 10 reps - Wall Clock with Theraband  - 1 x daily - 7 x weekly - 2 sets - 5 reps - Shoulder External Rotation and Scapular Retraction with Resistance  - 1 x daily - 7 x weekly - 2 sets - 10 reps - Standing Shoulder Horizontal Abduction with Resistance  - 1 x daily - 7 x weekly - 2 sets - 10 reps - Cervical Retraction with Resistance  - 1 x daily - 7 x weekly - 2 sets - 10 reps - Standing Low Trap Setting with Resistance at Wall  - 1 x daily - 7 x weekly - 2 sets - 10 reps  ASSESSMENT:  CLINICAL IMPRESSION:  Ms Stockham presents to skilled PT reporting that she has been going through a lot with her recent procedure on her leg and her neck is not bothering her as much as her healing leg incision.  Patient reports that she feels that she wants to try her exercises independently moving forward and will follow up with Dr Onetha, as needed.  Patient provided with updated HEP printout with some more advanced exercises to help with posture, as well as a green theraband to progress at home, when ready.  Patient educated on the benefits of improved posture and the role that it can play with decreased pain.  Patient reports that overall, she is feeling about 80% better and notes less pain with activities like changing the litter box and driving.  Patient discharged today with all goals met to continue with HEP and follow up with  MD, as needed.  OBJECTIVE IMPAIRMENTS: decreased activity tolerance, decreased ROM, decreased strength, increased muscle spasms, impaired UE functional use, postural dysfunction, and pain.   ACTIVITY LIMITATIONS: bending and reaching down or outward  PARTICIPATION LIMITATIONS: painful not limited  PERSONAL FACTORS: Age, Time since onset of injury/illness/exacerbation, and 1 comorbidity: see MRI above are also affecting patient's functional outcome.   REHAB POTENTIAL: Good  CLINICAL DECISION MAKING: Evolving/moderate complexity  EVALUATION COMPLEXITY:  Low   GOALS: Goals reviewed with patient? Yes  SHORT TERM GOALS: Target date: 05/20/2024   Patient will be independent with initial HEP.  Baseline:  Goal status: Met on 05/22/24  2.  Pt to report 50% improvement in centralization of sx  Baseline:  Goal status: Met on 05/22/24   LONG TERM GOALS: Target date: 06/17/2024   Patient will be independent with advanced/ongoing HEP to improve outcomes and carryover.  Baseline:  Goal status: Met on 05/22/24  2.  Patient will report centralization of sx > 80% with cleaning litter box and using the mouse.  Baseline:  Goal status: Met on 05/22/24  3.  Patient will demonstrate improved left lateral cervical flexion by 5- 10 degrees without sx provocation.  Baseline:  Goal status:Met on 05/22/24 (see chart above)     PLAN:  PT FREQUENCY: 1-2x/week  PT DURATION: 8 weeks  PLANNED INTERVENTIONS: 97164- PT Re-evaluation, 97110-Therapeutic exercises, 97530- Therapeutic activity, 97112- Neuromuscular re-education, 97535- Self Care, 02859- Manual therapy, [id number]- Electrical stimulation (unattended), 20560 (1-2 muscles), 20561 (3+ muscles)- Dry Needling, Patient/Family education, Joint mobilization, Spinal mobilization, Cryotherapy, and Moist heat    PHYSICAL THERAPY DISCHARGE SUMMARY  Visits from Start of Care: 3  Current functional level related to goals / functional outcomes: See above   Remaining deficits: Patient still has some radicular symptoms, but states they have improved   Education / Equipment: Continue HEP   Patient agrees to discharge. Patient goals were met. Patient is being discharged due to meeting the stated rehab goals.    Jarrell Laming, PT, DPT 05/22/24, 10:46 AM  [facility] [address] [place], [place] [address] Phone # [phone] Fax [phone]

## 2024-05-29 ENCOUNTER — Encounter
